# Patient Record
Sex: Male | Born: 1955 | Race: White | Hispanic: No | Marital: Married | Smoking: Former smoker
Health system: Southern US, Community
[De-identification: ages and names within clinical notes are randomized; demographics above are authoritative.]

## PROBLEM LIST (undated history)

## (undated) DIAGNOSIS — Z951 Presence of aortocoronary bypass graft: Secondary | ICD-10-CM

## (undated) DIAGNOSIS — I499 Cardiac arrhythmia, unspecified: Secondary | ICD-10-CM

## (undated) DIAGNOSIS — I10 Essential (primary) hypertension: Secondary | ICD-10-CM

## (undated) DIAGNOSIS — H9193 Unspecified hearing loss, bilateral: Secondary | ICD-10-CM

## (undated) DIAGNOSIS — T8859XA Other complications of anesthesia, initial encounter: Secondary | ICD-10-CM

## (undated) DIAGNOSIS — I219 Acute myocardial infarction, unspecified: Secondary | ICD-10-CM

## (undated) DIAGNOSIS — E785 Hyperlipidemia, unspecified: Secondary | ICD-10-CM

## (undated) DIAGNOSIS — I712 Thoracic aortic aneurysm, without rupture, unspecified: Secondary | ICD-10-CM

## (undated) DIAGNOSIS — I2510 Atherosclerotic heart disease of native coronary artery without angina pectoris: Secondary | ICD-10-CM

## (undated) DIAGNOSIS — Z8679 Personal history of other diseases of the circulatory system: Secondary | ICD-10-CM

## (undated) DIAGNOSIS — Z9889 Other specified postprocedural states: Secondary | ICD-10-CM

## (undated) HISTORY — DX: Hyperlipidemia, unspecified: E78.5

## (undated) HISTORY — PX: KNEE DEBRIDEMENT: SHX1894

## (undated) HISTORY — DX: Unspecified hearing loss, bilateral: H91.93

## (undated) HISTORY — DX: Atherosclerotic heart disease of native coronary artery without angina pectoris: I25.10

## (undated) HISTORY — DX: Thoracic aortic aneurysm, without rupture: I71.2

## (undated) HISTORY — DX: Thoracic aortic aneurysm, without rupture, unspecified: I71.20

## (undated) HISTORY — DX: Acute myocardial infarction, unspecified: I21.9

## (undated) HISTORY — DX: Morbid (severe) obesity due to excess calories: E66.01

## (undated) HISTORY — DX: Other specified postprocedural states: Z98.890

## (undated) HISTORY — PX: EXTERNAL EAR SURGERY: SHX627

## (undated) HISTORY — PX: HAND SURGERY: SHX662

## (undated) HISTORY — DX: Essential (primary) hypertension: I10

## (undated) HISTORY — PX: CORONARY ARTERY BYPASS GRAFT: SHX141

## (undated) HISTORY — DX: Personal history of other diseases of the circulatory system: Z86.79

## (undated) HISTORY — PX: PILONIDAL CYST EXCISION: SHX744

## (undated) HISTORY — DX: Presence of aortocoronary bypass graft: Z95.1

---

## 2000-06-22 ENCOUNTER — Encounter (INDEPENDENT_AMBULATORY_CARE_PROVIDER_SITE_OTHER): Payer: Self-pay | Admitting: *Deleted

## 2000-06-22 ENCOUNTER — Ambulatory Visit (HOSPITAL_BASED_OUTPATIENT_CLINIC_OR_DEPARTMENT_OTHER): Admission: RE | Admit: 2000-06-22 | Discharge: 2000-06-22 | Payer: Self-pay | Admitting: Orthopedic Surgery

## 2006-12-22 ENCOUNTER — Encounter: Admission: RE | Admit: 2006-12-22 | Discharge: 2006-12-22 | Payer: Self-pay | Admitting: Family Medicine

## 2007-03-13 ENCOUNTER — Encounter: Admission: RE | Admit: 2007-03-13 | Discharge: 2007-03-13 | Payer: Self-pay | Admitting: Sports Medicine

## 2007-04-04 ENCOUNTER — Emergency Department (HOSPITAL_COMMUNITY): Admission: EM | Admit: 2007-04-04 | Discharge: 2007-04-04 | Payer: Self-pay | Admitting: Emergency Medicine

## 2008-02-12 ENCOUNTER — Encounter: Admission: RE | Admit: 2008-02-12 | Discharge: 2008-02-12 | Payer: Self-pay | Admitting: Orthopedic Surgery

## 2008-02-23 ENCOUNTER — Encounter: Admission: RE | Admit: 2008-02-23 | Discharge: 2008-02-23 | Payer: Self-pay | Admitting: Family Medicine

## 2008-02-29 ENCOUNTER — Other Ambulatory Visit: Payer: Self-pay | Admitting: Orthopedic Surgery

## 2008-02-29 ENCOUNTER — Ambulatory Visit (HOSPITAL_BASED_OUTPATIENT_CLINIC_OR_DEPARTMENT_OTHER): Admission: RE | Admit: 2008-02-29 | Discharge: 2008-02-29 | Payer: Self-pay | Admitting: Orthopedic Surgery

## 2010-05-15 ENCOUNTER — Other Ambulatory Visit: Payer: Self-pay | Admitting: Otolaryngology

## 2010-05-15 DIAGNOSIS — H7010 Chronic mastoiditis, unspecified ear: Secondary | ICD-10-CM

## 2010-05-22 ENCOUNTER — Ambulatory Visit
Admission: RE | Admit: 2010-05-22 | Discharge: 2010-05-22 | Disposition: A | Discharge status: Home / Self Care | Payer: 59 | Source: Ambulatory Visit | Attending: Otolaryngology | Admitting: Otolaryngology

## 2010-05-22 DIAGNOSIS — H7010 Chronic mastoiditis, unspecified ear: Secondary | ICD-10-CM

## 2010-05-22 MED ORDER — IOHEXOL 300 MG/ML  SOLN
75.0000 mL | Freq: Once | INTRAMUSCULAR | Status: AC | PRN
  Administered 2010-05-22: 75 mL via INTRAVENOUS

## 2010-06-01 LAB — POCT I-STAT, CHEM 8
BUN: 17 mg/dL (ref 6–23)
Chloride: 107 mEq/L (ref 96–112)
Creatinine, Ser: 1.1 mg/dL (ref 0.4–1.5)
Glucose, Bld: 99 mg/dL (ref 70–99)
Sodium: 140 mEq/L (ref 135–145)

## 2010-06-10 ENCOUNTER — Other Ambulatory Visit: Payer: Self-pay | Admitting: Family Medicine

## 2010-06-10 DIAGNOSIS — I712 Thoracic aortic aneurysm, without rupture: Secondary | ICD-10-CM

## 2010-06-16 ENCOUNTER — Ambulatory Visit
Admission: RE | Admit: 2010-06-16 | Discharge: 2010-06-16 | Disposition: A | Discharge status: Home / Self Care | Payer: 59 | Source: Ambulatory Visit | Attending: Family Medicine | Admitting: Family Medicine

## 2010-06-16 DIAGNOSIS — I712 Thoracic aortic aneurysm, without rupture: Secondary | ICD-10-CM

## 2010-06-16 MED ORDER — IOHEXOL 300 MG/ML  SOLN
100.0000 mL | Freq: Once | INTRAMUSCULAR | Status: AC | PRN
  Administered 2010-06-16: 100 mL via INTRAVENOUS

## 2010-06-22 ENCOUNTER — Encounter (INDEPENDENT_AMBULATORY_CARE_PROVIDER_SITE_OTHER): Payer: 59 | Admitting: Thoracic Surgery (Cardiothoracic Vascular Surgery)

## 2010-06-22 DIAGNOSIS — I7101 Dissection of thoracic aorta: Secondary | ICD-10-CM
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
## 2010-06-30 NOTE — Op Note (Signed)
NAME:  Jacob Small, Jacob Small [name]:  [id number]   MEDICAL RECORD [name]:  [id number]          PATIENT TYPE:  AMB   LOCATION:  DSC                          FACILITY:  [facility]   PHYSICIAN:  Rodney A. Mortenson, M.D.DATE OF BIRTH:  February 10, 1956   DATE OF PROCEDURE:  02/29/2008  DATE OF DISCHARGE:                               OPERATIVE REPORT   JUSTIFICATION:  This is an 55 year old male referred through courtesy of  Dr. Blair Heys for a long history of right knee pain.  In September,  he did a lot of walk and was noted to have an increasing pain especially  along the medial joint line.  When he walks on treadmill, he has a  significant pain in the knee.  There is acute tenderness along the  medial joint line.  No fluid in the knee.  X-rays showed some narrowing  in the medial compartment.  Then, MRI shows an oblique tear of the post  horn of the medial meniscus, which extends into the meniscal body.  There was degenerative changes throughout the knee.  Because of  persistent pain and discomfort, he is now admitted for arthroscopic  evaluation and treatment.  Questions answered and encouraged  preoperatively.  Complications discussed extensively.   JUSTIFICATION FOR OUTPATIENT SURGERY:  Minimal morbidity.   PREOPERATIVE DIAGNOSES:  1. Tear, medial meniscus, right knee.  2. Early osteoarthritis, right knee.   POSTOPERATIVE DIAGNOSES:  1. Tear, medial meniscus, right knee.  2. Early osteoarthritis, right knee.   OPERATIONS:  1. Chondroplasty of the lateral femoral condyle, right knee.  2. Debridement of posterior half of the medial meniscus, right knee.   SURGEON:  Lenard Galloway. Chaney Malling, MD   ANESTHESIA:  MAC.   PATHOLOGY:  With the arthroscope in the knee, a very careful examination  was undertaken.  The patellofemoral joint appeared fairly normal.  As  the arthroscope was moved distally, there was some grade 2 and grade 3  changes over the anterior aspect of the  lateral femoral condyle but not  in the weightbearing area distally.  The lateral compartment was  visualized.  Articular cartilage, weightbearing area of the lateral  femoral condyle and lateral tibial plateau was absolutely normal, and  the entire circumference of the lateral meniscus was normal.  The  anterior cruciate ligament was visualized and this was normal.  In the  medial compartment, there was normal articular cartilage of the medial  femoral condyle.  There was a fissure and some early osteoarthritis  about the medial tibial plateau area.  There was an extensive tear of  the medial meniscus from the mid third to the posterior attachment.   PROCEDURE:  The patient placed on the operating table in a supine  position with pneumatic tourniquet about the right upper thigh.  The  right leg was placed in a leg holder.  The entire right lower extremity  prepped with DuraPrep and draped out in the usual manner.  An infusion  cannula was placed in superior medial pouch and knee distended with  saline.  Anteromedial and anterolateral portal was made, and  the  arthroscope was introduced.  The findings described as above.  Initially, attention turned to the anterior aspect of the lateral  femoral condyle and was debrided with chondroplastic shaver.  Once this  was debrided to my satisfaction to fairly stable cartilage, the  arthroscope was then passed into the medial compartment.  Through both  the medial and lateral portals, a series of baskets were inserted and  the posterior half of the medial meniscus was very aggressively  debrided.  This was followed up with the intra-articular shaver.  All  debris was removed.  The remaining rim was then aggressively contoured  and scalped with a chondroplastic shaver.  There remained a nice stable  rim of the posterior horn to the mid third of the medial meniscus, and  from the mid third anteriorly, there was normal meniscal tissue.  After   decompression, the knee was markedly improved.  Knee was then filled  with Marcaine.  Technically, I was extremely pleased with the surgical  outcome.  Large bulky pressure dressing was applied, and the patient  returned to the recovery room in excellent condition.   DRAINS:  None.   COMPLICATIONS:  None.   DISPOSITION:  1. Percocet for pain.  2. Usual postoperative instructions were given to the patient.  3. Return to my office on Wednesday next week.      Rodney A. Chaney Malling, M.D.  Electronically Signed     RAM/MEDQ  D:  02/29/2008  T:  03/01/2008  Job:  [id number]   cc:   Bryan Lemma. Manus Gunning, M.D.

## 2010-07-03 NOTE — Op Note (Signed)
[facility]  Patient:    Jacob Small, Jacob Small                       MRN: [id number] Proc. Date: 06/22/00 Adm. Date:  [id number] Attending:  Marlowe Shores                           Operative Report  PREOPERATIVE DIAGNOSIS:  Painful mass, palmar aspect, left hand.  POSTOPERATIVE DIAGNOSIS:  Painful mass, palmar aspect, left hand.  PROCEDURE:  Excisional biopsy, deep, left hand, palmar aspect.  SURGEON:  Artist Pais. Mina Marble, M.D.  ASSISTANT:  Junius Roads. Ireton, P.A.C.  ANESTHESIA:  Bier block.  TOURNIQUET TIME:  Thirty minutes.  COMPLICATIONS:  No complications.  DRAINS:  No drains.  DESCRIPTION OF PROCEDURE:  Patient was taken to the operating room and after induction of adequate Bier block analgesia, the left upper extremity was prepped and draped in the usual sterile fashion.  At this point in time, a 3-cm incision was made longitudinally, paralleling the index finger metacarpal area.  Incision was taken down through the skin and subcutaneous tissue with careful dissection through the palmar fascia and revealed a large mass which appeared to be an hemangioma.  This was carefully dissected down and removed for pathologic confirmation.  There were multiple branches that were tied off using 3-0 Vicryl ties.  The wound was then thoroughly irrigated.  Hemostasis was achieved with bipolar cautery and then wound was then closed with a running 3-0 Prolene subcuticular stitch.  Steri-Strips, 4 x 4s, fluffs and compressive dressing were applied as well as a volar splint.  Patient also had 0.25% plain Marcaine injected for postoperative pain control.  Patient tolerated procedure well and went to the recovery room in stable fashion. DD:  06/22/00 TD:  06/22/00 Job: [id number]

## 2010-08-11 ENCOUNTER — Encounter (HOSPITAL_COMMUNITY)
Admission: RE | Admit: 2010-08-11 | Discharge: 2010-08-11 | Disposition: A | Discharge status: Home / Self Care | Payer: 59 | Source: Ambulatory Visit | Attending: Otolaryngology | Admitting: Otolaryngology

## 2010-08-11 LAB — SURGICAL PCR SCREEN
MRSA, PCR: POSITIVE — AB
Staphylococcus aureus: POSITIVE — AB

## 2010-08-11 LAB — DIFFERENTIAL
Basophils Absolute: 0.1 10*3/uL (ref 0.0–0.1)
Basophils Relative: 1 % (ref 0–1)
Eosinophils Absolute: 0.1 10*3/uL (ref 0.0–0.7)
Eosinophils Relative: 1 % (ref 0–5)
Lymphocytes Relative: 27 % (ref 12–46)
Neutrophils Relative %: 64 % (ref 43–77)

## 2010-08-11 LAB — COMPREHENSIVE METABOLIC PANEL
ALT: 36 U/L (ref 0–53)
AST: 22 U/L (ref 0–37)
BUN: 15 mg/dL (ref 6–23)
CO2: 26 mEq/L (ref 19–32)
Calcium: 9.6 mg/dL (ref 8.4–10.5)
Creatinine, Ser: 0.91 mg/dL (ref 0.50–1.35)
GFR calc Af Amer: >60 mL/min (ref >60)

## 2010-08-11 LAB — CBC
HCT: 44 % (ref 39.0–52.0)
Hemoglobin: 15.5 g/dL (ref 13.0–17.0)
MCV: 83.2 fL (ref 78.0–100.0)
RBC: 5.29 MIL/uL (ref 4.22–5.81)

## 2010-08-14 ENCOUNTER — Other Ambulatory Visit: Payer: Self-pay | Admitting: Otolaryngology

## 2010-08-14 ENCOUNTER — Inpatient Hospital Stay (HOSPITAL_COMMUNITY)
Admission: RE | Admit: 2010-08-14 | Discharge: 2010-08-18 | DRG: 135 | Disposition: A | Discharge status: Home / Self Care | Payer: 59 | Source: Ambulatory Visit | Attending: Otolaryngology | Admitting: Otolaryngology

## 2010-08-14 DIAGNOSIS — I712 Thoracic aortic aneurysm, without rupture, unspecified: Secondary | ICD-10-CM | POA: Diagnosis present

## 2010-08-14 DIAGNOSIS — I519 Heart disease, unspecified: Secondary | ICD-10-CM | POA: Diagnosis not present

## 2010-08-14 DIAGNOSIS — Z6841 Body Mass Index (BMI) 40.0 and over, adult: Secondary | ICD-10-CM

## 2010-08-14 DIAGNOSIS — I2119 ST elevation (STEMI) myocardial infarction involving other coronary artery of inferior wall: Secondary | ICD-10-CM | POA: Diagnosis not present

## 2010-08-14 DIAGNOSIS — H7120 Cholesteatoma of mastoid, unspecified ear: Secondary | ICD-10-CM | POA: Diagnosis present

## 2010-08-14 DIAGNOSIS — Z79899 Other long term (current) drug therapy: Secondary | ICD-10-CM

## 2010-08-14 DIAGNOSIS — H7010 Chronic mastoiditis, unspecified ear: Principal | ICD-10-CM | POA: Diagnosis present

## 2010-08-14 DIAGNOSIS — Z01812 Encounter for preprocedural laboratory examination: Secondary | ICD-10-CM

## 2010-08-14 DIAGNOSIS — H903 Sensorineural hearing loss, bilateral: Secondary | ICD-10-CM | POA: Diagnosis present

## 2010-08-14 DIAGNOSIS — Y836 Removal of other organ (partial) (total) as the cause of abnormal reaction of the patient, or of later complication, without mention of misadventure at the time of the procedure: Secondary | ICD-10-CM | POA: Diagnosis not present

## 2010-08-14 DIAGNOSIS — E781 Pure hyperglyceridemia: Secondary | ICD-10-CM | POA: Diagnosis present

## 2010-08-14 DIAGNOSIS — I10 Essential (primary) hypertension: Secondary | ICD-10-CM | POA: Diagnosis present

## 2010-08-14 DIAGNOSIS — E669 Obesity, unspecified: Secondary | ICD-10-CM | POA: Diagnosis present

## 2010-08-14 LAB — GLUCOSE, CAPILLARY: Glucose-Capillary: 223 mg/dL — ABNORMAL HIGH (ref 70–99)

## 2010-08-15 LAB — BASIC METABOLIC PANEL
Calcium: 9.3 mg/dL (ref 8.4–10.5)
Chloride: 101 mEq/L (ref 96–112)
Creatinine, Ser: 0.84 mg/dL (ref 0.50–1.35)
GFR calc Af Amer: >60 mL/min (ref >60)
Glucose, Bld: 154 mg/dL — ABNORMAL HIGH (ref 70–99)
Sodium: 137 mEq/L (ref 135–145)

## 2010-08-15 LAB — CARDIAC PANEL(CRET KIN+CKTOT+MB+TROPI)
CK, MB: 31.1 ng/mL (ref 0.3–4.0)
Relative Index: 10.2 — ABNORMAL HIGH (ref 0.0–2.5)
Total CK: 306 U/L — ABNORMAL HIGH (ref 7–232)

## 2010-08-15 LAB — CBC
HCT: 39 % (ref 39.0–52.0)
Platelets: 189 10*3/uL (ref 150–400)
RDW: 12.9 % (ref 11.5–15.5)

## 2010-08-16 LAB — CARDIAC PANEL(CRET KIN+CKTOT+MB+TROPI)
Relative Index: 9.2 — ABNORMAL HIGH (ref 0.0–2.5)
Total CK: 137 U/L (ref 7–232)

## 2010-08-17 DIAGNOSIS — I2510 Atherosclerotic heart disease of native coronary artery without angina pectoris: Secondary | ICD-10-CM

## 2010-08-17 DIAGNOSIS — Z01810 Encounter for preprocedural cardiovascular examination: Secondary | ICD-10-CM

## 2010-08-17 LAB — BASIC METABOLIC PANEL
BUN: 14 mg/dL (ref 6–23)
Calcium: 9.3 mg/dL (ref 8.4–10.5)
Creatinine, Ser: 0.84 mg/dL (ref 0.50–1.35)
GFR calc non Af Amer: >60 mL/min (ref >60)
Glucose, Bld: 93 mg/dL (ref 70–99)

## 2010-08-17 LAB — PROTIME-INR: Prothrombin Time: 14 seconds (ref 11.6–15.2)

## 2010-08-17 LAB — CBC
MCH: 28.8 pg (ref 26.0–34.0)
MCHC: 34.3 g/dL (ref 30.0–36.0)
Platelets: 166 10*3/uL (ref 150–400)
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
## 2010-08-18 ENCOUNTER — Encounter: Payer: Self-pay | Admitting: *Deleted
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
## 2010-08-20 NOTE — Op Note (Signed)
NAME:  Jacob Small, Jacob Small [name]:  [id number]  MEDICAL RECORD [name]:  [id number]  LOCATION:  2908                         FACILITY:  [facility]  PHYSICIAN:  Carolan Shiver, M.D.    DATE OF BIRTH:  10/23/1955  DATE OF PROCEDURE:  08/14/2010 DATE OF DISCHARGE:                              OPERATIVE REPORT   JUSTIFICATION FOR PROCEDURE:  Beulah Gandy. Steib is a 55 year old white male who is here today for a revision right canal-up tympanomastoidectomy.  The patient has had a long history of chronic ear disease.  He underwent a previous right tympanomastoidectomy by Dr. Hermelinda Medicus in the 1980s.  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  It was draining purulent fluid from the inferior aspect of his right postauricular incision.  He was treated with oral antibiotics and the fistula did close spontaneously.  A CT scan of his temporal bones was performed and the patient was found to have some chronic residual mastoiditis with most of his digastric tip still present.  It was felt that he had smoldering chronic right mastoiditis due to an incomplete regional mastoidectomy and this is what led him to the mastoid cutaneous fistula.  He was recommended for a revision of right canal-up tympanomastoidectomy.  He was also noted to have some cholesteatoma of the anterior aspect of his right tympanic membrane and was informed that this would be excised and his tympanic membrane with be repaired.  Risks, complications, and alternatives of the procedures were explained to him.  Questions were invited and answered.  An informed consent was signed and witnessed.  Preop audiometric testing on August 11, 2010 documented bilateral moderate severe sensorineural hearing losses with an SRT of 65 dB AD with 68% discrimination and an SRT of 60 dB AS with 60% discrimination.  JUSTIFICATION FOR INPATIENT SETTING: 1. The patient's age need for general endotracheal  anesthesia. 2. The patient has a history of coronary artery disease and requires     postoperative cardiac monitoring and observation in ICU step-down     unit.  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 Hermelinda Medicus in the 1980s.  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 Hermelinda Medicus in the 1980s.  OPERATION: 1. Revision of right canal-up tympanomastoidectomy with excision of     right tympanic membrane cholesteatoma and type 1 medial fascia     graft tympanoplasty. 2. Microdissection using the operating room microscope.  SURGEON:  Carolan Shiver, MD  ANESTHESIA:  General endotracheal, Zenon Mayo, MD and CRNA, [facility].  COMPLICATIONS:  None.  SUMMARY REPORT:  After the patient was taken to the operating room, he was placed in the supine position.  An IV had been begun in the holding area.  General IV induction was then performed under the guidance of Dr. Sampson Goon.  The patient was then orally intubated by [facility] without complications.  He was properly positioned and monitored. Elbows and ankles were padded with foam rubber and a Foley catheter was inserted.  The patient is a very  large man and his arms were supported on arm boards as he was wider than the operating room table.  Again, elbows and ankles were padded with foam rubber and I initiated a time- out.  Small amount of hair was then clipped in the right postauricular area. His hair was taped and stocking cap was applied.  A right postauricular incision was marked to include excision of the previous scar.  The area was then infiltrated with 8 mL of 1% Xylocaine with 1:100,000 epinephrine.  Several wire needle electrodes were then inserted into the right orbicularis oris and oculi muscles and  suprasternal notch connected to a facial nerve monitor preamplifier.  The patient's right ear and hemiface were then prepped with Betadine and draped in standard fashion for a revision of tympanomastoidectomy.  Examination of the right ear canal with using the operating room microscope revealed a 5.5-mm diameter ear canal.  There was a cholesteatoma present in the anterior portion of the tympanic membrane extending from 2 o'clock to 5 o'clock with a dimpled center.  The cholesteatoma could be seen spreading along the mural surface of the tympanic membrane and extended from approximately 1 o'clock to almost 6 o'clock.  Ear canal was cleaned and debrided, four-quadrant ear canal blocks were performed with 1.5 mL of 2% Xylocaine with 1:50,000 epinephrine.  The ear canal was irrigated with saline.  A right postauricular incision was then made and the previous postauricular scar was excised.  Anterior and posterior flaps were elevated.  Soft tissue in the area of the mastoid cutaneous fistula was removed.  T-shaped incision was then made in the musculoperiosteal layer.  The anterior and posterior subperiosteal flaps were elevated. The mastoid cavity itself was partially filled with chronically infected tissue.  The tissue was left in the mastoid cavity as the flaps were elevated.  Tissue was then excised and sent as a specimen.  Examination of the mastoid cavity revealed that the entire digastric tip cells had not been removed nor had the cells been removed around the sigmoid sinus both anteriorly and posteriorly.  Using a #7 cutting bur on a [employer]ryker saber drill with continuous suction irrigation, complete mastoidectomy was performed.  Cells were removed from the tegmen mastoid DM along the sigmoid sinus both anteriorly and posteriorly and that digastric tip cells were completely removed down until digastric periosteum was identified.  The dissection was then carried into the antrum and  attic.  Bone lateral to the ossicles was removed with a #3 cutting and diamond burs and a #2 diamond bur and the ossicles were inspected.  There was a soft tissue around them, but no cholesteatoma.  The ossicles were intact and mobile.  The dome of lateral semicircular canal was also normal.  Cells entrapments triangle were removed.  The entire mastoid cavity was then polished with diamond burs using continuous suction irrigation.  Posterior canal wall skin was then dissected medially with a Therapist, nutritional and an incision was made in the posterior canal wall skin from 6 to 12 o'clock just medial to the [facility] junction.  Self-retaining Bellucci retractor was inserted, but the retractor was not large enough because of the patient's size.  Therefore, a second retractor was inserted and neurosurgical fishhooks were also used to pull the soft tissue anteriorly in order to gain exposure.  The patient had a narrow ear canal and very difficult exposure of his tympanic membrane in middle ear.  Once the exposure had been obtained, the partial myringectomy was performed with a  59-10 beaver blade to excise the anterior tympanic membrane cholesteatoma.  This left the patient with a 50% perforation. The edges of the perforation were checked with a 45 degrees McCabe perforation rasp for any residual mural cholesteatoma.  Posterior one- half the patient's tympanic membrane was completely collapsed, draped over the incus and onto the promontory.  A vertical incision was then made and 5 o'clock with a McCabe knife and sickle knife.  An atticotomy flap was then elevated.  It was necessary to perform a canal plasty in order to open the canal wide enough just to raise the atticotomy flap.  The middle ear was then entered posterosuperiorly.  The chorda tympani nerve was stuck down to the collapse tympanic membrane.  I was able to remove the tympanic membrane from the collapsed chorda tympani nerve, but  eventually sacrificed the chorda tympani in order to simply gain exposure to the ear.  More posterior canal wall bone was removed with a #2 diamond bur.  The ossicles were then checked and were found to be intact and mobile.  A temporalis fascia graft was then harvested from the right supra- auricular area, pressed between tongue blades and dried.  The graft was then trimmed and then placed medial to the tympanic membrane remnant and to the malleus handle.  The graft was then brought out through the perforation and folded back 180 degrees posteriorly.  The protympanum was then packed with Gelfoam disk soaked in Cipro HC.  The graft was then rotated 180 degrees and tucked for 360 degrees around the circumference of the perforation.  The mesotympanum was then packed Gelfoam disk soaked in the same antibiotic solution.  The graft was then draped up the posterior bony canal wall to cover exposed bone and the atticotomy flap was draped over the fascia graft.  Medial canal was then packed with Gelfoam disks soaked in Cipro HC and the lateral canal was packed with a quarter inch iodoform Nu gauze impregnated with bacitracin ointment.  A sliver Penrose drain was placed in the mastoid cavity.  Bleeding was controlled with electrocautery and the incision was then closed in 3 layers using interrupted 3-0 Vicryls for the muscular periosteal layer and inverted interrupted 3-0 Vicryls for the subcutaneous layer.  Prior to closing the subcutaneous layer, the site was copiously irrigated with bacitracin containing saline.  The skin was closed with running locking 5-0 Ethilon.  Bacitracin ointment was applied.  The ear was padded with Telfa and cotton and the standard adult Glasscock mastoid dressing was applied loosely in the standard fashion.  Foley catheter was removed. The patient was awakened, extubated and transferred to his hospital bed. He appeared to tolerate with the general endotracheal  anesthesia and the procedure well, left the operating room in stable condition.  TOTAL FLUIDS:  1700 mL.  TOTAL BLOOD LOSS:  Less than 30 mL.  TOTAL URINE OUTPUT:  400 mL.  Sponge, needle and cotton ball counts were correct at the termination of the procedure.  Two specimens were sent to pathology: 1. Right mastoid contents. 2. Right tympanic membrane cholesteatoma.  Microdissection using the operating room microscope was utilized during this procedure.  The patient received Ancef 1 gram IV, Zofran 4 mg IV at the beginning of the procedure and Decadron 10 mg IV.  Mr. Mcnelly will be admitted to the PACU, then to a step-down bed with cardiac monitoring for overnight observation.  If stable overnight, he will be discharged on August 15, 2010, with his wife who  will be instructed to return him to my office on August 24, 2010, at 1:50 p.m.  DISCHARGE MEDICATIONS: 1. Cefzil 500 mg p.o. b.i.d. x10 days with food. 2. Vicodin #30 one-two p.o. q.4 h p.r.n. pain. 3. Cipro HC drops, 2 drops AD t.i.d. times 1 week.  He is to keep his head elevated on 3 pillows for the next 5 evenings, avoid water exposure AD and follow a regular diet.  He is to call to [phone] for any postoperative problems directly related to the procedure.  He will be given both verbal and written instructions and informed that the instructions are available on our web site at [email or web address]  SUMMARY:  The patient was found to have residual disease in his right mastoid tip.  The right mastoid tip had not been removed during the previous mastoidectomy performed by Dr. Haroldine Laws in the 1980s.  The patient also had a large right tympanic membrane cholesteatoma.  The anterior one-half of his tympanic membrane was excised along with the cholesteatoma.  A complete mastoidectomy was performed removing all of the tip cells and cells around the right sigmoid sinus.  The ossicles found to be intact and mobile.  Chorda  tympani nerve was sacrificed as the patient had adhesive otitis media with complete collapse of his tympanic membrane posteriorly to the promontory and shrink wrapped around the long process of the incus.  There was no incus erosion.  No prostheses, tubes, or cement were placed in the ear.  There were no complications.  Microdissection using the operating room microscope was utilized and was necessary to perform the procedure.  The type 1 medial fascia graft tympanoplasty was used to repair the tympanic membrane perforation secondary to the partial myringectomy.  The patient did not have any intraoperative complications.  Sponge, needle, and cotton ball counts were correct at the termination of the procedure.     Carolan Shiver, M.D.     EMK/MEDQ  D:  08/14/2010  T:  08/15/2010  Job:  [id number]  cc:   Bryan Lemma. Manus Gunning, M.D. [name], M.D. Lyn Records, M.D.  Electronically Signed by Ermalinda Barrios M.D. on 08/20/2010 12:25:06 PM
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
## 2010-08-20 NOTE — Discharge Summary (Signed)
NAME:  Jacob Small, Jacob Small [name]:  [id number]  MEDICAL RECORD [name]:  [id number]  LOCATION:  2025                         FACILITY:  [facility]  PHYSICIAN:  Lyn Records, M.D.   DATE OF BIRTH:  1955/12/16  DATE OF ADMISSION:  08/14/2010 DATE OF DISCHARGE:  08/18/2010                              DISCHARGE SUMMARY   DISCHARGE DIAGNOSES: 1. Elective right tympanomastoidectomy on August 14, 2010, by Dr. Ermalinda Barrios.     a.     History of chronic recurrent infection. 2. Acute inferior ST-elevation myocardial infarction several hours     after surgery on August 14, 2010, treated with medical therapy. 3. Known history of coronary artery disease with prior inferior     myocardial infarction treated with angioplasty in 1997. 4. Aortic aneurysm, ascending with aortic root diameter of 5.4 cm. 5. Hypertension, poorly controlled as an outpatient.     a.     Significant improvement and control at the time of      discharge. 6. Hyperlipidemia.  RECOMMENDATIONS: 1. The patient will see Dr. Tressie Stalker on August 22, 2010, for     consideration of aortic root replacement and two-vessel coronary     artery bypass surgery. 2. Medications at discharge include:     a.     Micardis 40 mg daily.     b.     Amlodipine 5 mg daily.     c.     Aspirin 325 mg per day.     d.     Carvedilol 12.5 mg twice daily.     e.     Rosuvastatin 10 mg daily.     f.     Isosorbide mononitrate XR 60 mg daily.     g.     Nitroglycerin 0.4 mg sublingually p.r.n. if recurring chest      or arm discomfort. 3. Activity:  Limited to activities that are aerobic without planned     exercise or isometric activity.  He is to remain out of work. 4. Diet:  Low salt, fat modified, heart healthy. 5. The patient is to call if any recurrent chest or arm discomfort.     He is instructed on the use of sublingual nitroglycerin.  CONDITION ON DISCHARGE:  Improved.  HISTORY, PHYSICAL, AND HOSPITAL COURSE:  The patient was  brought into the hospital electively by Dr. Dorma Russell and underwent successful right tympanomastoidectomy on August 14, 2010.  Several hours after the operation, the patient became nauseated and began having severe left shoulder discomfort.  An EKG demonstrated ST elevation in the inferior leads.  He was taken to the catheterization laboratory by Dr. Lance Muss where he was found to have a totally occluded right coronary. The right coronary could not be selectively engaged.  He was noted to have significant briskly filly collaterals to the inferior wall/PDA.  He was also noted at the time of cath to have severe complex proximal-to- mid LAD disease.  Many of the collaterals to the PDA were from a large septal perforator.  At the time of cath, the patient had resolution of chest discomfort and ST-segment elevation.  No  further intervention was performed.  He remained in the coronary care unit for 48 hours.  He was able to ambulate with the assistance of Cardiac Rehab.  There were no recurrent symptoms.  The peak CPK-MB was 306/31.1 and the peak troponin I was 4.26.  Creatinine at discharge is 0.84.  Because of the patient's complex cardiovascular anatomy including an aortic aneurysm making catheterization somewhat difficult, totally occluded right coronary collateralized from the left coronary, and complex disease in the LAD, it was felt that a combined procedure including aortic root replacement and simultaneous coronary artery bypass grafting was the patient's best treatment option.  He was seen in consultation by Dr. Evelene Croon who agreed with this approach.  After some discussion among physicians and the patient and family, we have decided to discharge the patient home.  He has remained relatively inactive.  He will expeditiously see Dr. Cornelius Moras on Monday, August 22, 2010. He will return if any recurrent chest discomfort.  We hope that he can undergo this surgical procedure within the  next 7-10 days.  At the time of discharge, the patient is improved and has had no recurring episodes of chest discomfort since August 14, 2010.     Lyn Records, M.D.     HWS/MEDQ  D:  08/18/2010  T:  08/18/2010  Job:  [id number]  cc:   Carolan Shiver, M.D. [name], M.D. Bryan Lemma. Manus Gunning, M.D.  Electronically Signed by Verdis Prime M.D. on 08/20/2010 01:38:54 PM

## 2010-08-24 ENCOUNTER — Ambulatory Visit (INDEPENDENT_AMBULATORY_CARE_PROVIDER_SITE_OTHER): Payer: 59 | Admitting: Thoracic Surgery (Cardiothoracic Vascular Surgery)

## 2010-08-24 DIAGNOSIS — I712 Thoracic aortic aneurysm, without rupture, unspecified: Secondary | ICD-10-CM

## 2010-08-24 DIAGNOSIS — I2510 Atherosclerotic heart disease of native coronary artery without angina pectoris: Secondary | ICD-10-CM

## 2010-08-25 NOTE — Assessment & Plan Note (Signed)
OFFICE VISIT  Jacob, Small DOB:  [birth year]                                        August 24, 2010 CHART #:  [id number]  HISTORY OF PRESENT ILLNESS:  The patient returns for followup of recently discovered two-vessel coronary artery disease and associated with his known aneurysm of the aortic root in the ascending thoracic aorta.  I originally had the privilege of seeing him in consultation in May of this year when he presented for evaluation of his thoracic aortic aneurysm.  At that time, his aneurysm was measured between 5.0 and 5.3 cm in his greatest transverse diameter, and medical therapy was recommended with plans for annual surveillance followup with repeat CT angiograms.  Subsequently, the patient underwent preoperative cardiac evaluation by Dr. Verdis Prime with an exercise stress test that demonstrated a fairly large partially reversible inferior wall ischemic defect.  Because the patient was completely asymptomatic and otherwise clinically doing well, the patient was cleared for elective ear surgery by Dr. Dorma Russell.  This was performed on August 14, 2010.  The patient subsequently developed an acute non-ST-segment elevation myocardial infarction the evening following surgery.  Of note, his ear surgery was fairly extensive and required several hours of general anesthesia.  The patient recovered uneventfully after this complication.  Catheterization demonstrated severe two-vessel coronary artery disease with preserved left ventricular function.  The patient was seen in consultation by Dr. Laneta Simmers while he was in the hospital, and followup appointment was arranged for me to see him here in the office today.  The patient has also been seen in followup earlier today by Dr. Dorma Russell for suture removal.  He remains on antibiotic eardrops and nasal swab with antibiotic ointment because of his chronic MRSA infection.  Oral antibiotics have been discontinued.  The  patient returns for followup today.  He reports that he has not had any further episodes of pain in his chest or left shoulder since he underwent catheterization while he was in the hospital.  He has not had any shortness of breath.  He otherwise feels quite well.  The swelling around his right ear has apparently improved, but is still not completely resolved.  He has no other complaints.  The remainder of his review of systems is unremarkable.  CURRENT MEDICATIONS: 1. Aspirin 325 mg daily. 2. Amlodipine 5 mg daily. 3. Micardis 40 mg daily. 4. Carvedilol 12.5 mg twice daily. 5. Isosorbide mononitrate 60 mg daily. 6. Bactroban nasal ointment 2% twice daily. 7. Crestor 10 mg daily. 8. Nitroglycerin sublingual as needed (none used). 9. Antibiotic eardrops (Brandon dose unknown presently).  PHYSICAL EXAMINATION:  General:  The patient is a well-appearing male who appears his stated age, in no acute distress.  Vital Signs:  Blood pressure 140/84 and pulse 67 and regular.  Chest:  Examination of the chest reveals clear breath sounds that are symmetrical bilaterally. Cardiovascular:  Notable for regular rate and rhythm.  No murmurs, rubs or gallops are noted. Abdomen:  Soft and nontender.  Extremities:  Warm and well-perfused. There is no lower extremity edema.  DIAGNOSTIC TESTS:  Cardiac catheterization performed by Dr. Eldridge Dace, August 14, 2010, is reviewed.  Findings are as noted previously in the fairly thorough consultation report dictated by Dr. Laneta Simmers on August 17, 2010.  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  The patient has remained clinically stable since hospital discharge.  I agree that he would best be treated with  elective coronary artery bypass grafting.  His aneurysm is certainly large enough that it should be treated surgically when he undergoes surgical revascularization for treatment of his coronary artery disease.  Hopefully, this could be performed with preservation of the native aortic valve.  Transesophageal echocardiogram would be helpful to further evaluate the anatomy of the native aortic valve, but I would be reluctant to put the patient through transesophageal echocardiogram at this point prior to surgery because of his fairly tight coronary artery anatomy and a potential for another non-ST-segment elevation myocardial infarction.  It was not clear to me his status with respect to his methicillin resistant Staphylococcus aureus infection as it will certainly be important to make sure that timing is as optimal as possible to decrease risk of exacerbation of further problems with his ear, or further complicate fairly large surgical procedure that includes placement of prosthetic material.  PLAN:  We will contact Dr. Dorma Russell directly to discuss matters with regards to the ear and the chronic infection.  The patient will return in one week's time, so that we can make more definitive plans with respect to the timing of surgery.  He will certainly call should he develop any problems with recurrent chest pain suspicious for unstable angina. All of his questions have been addressed.  [name], M.D. Electronically Signed  CHO/MEDQ  D:  08/24/2010  T:  08/25/2010  Job:  [id number]  cc:   Jacob Small, M.D. Jacob Small, M.D. Jacob Small, M.D.

## 2010-08-31 ENCOUNTER — Encounter (INDEPENDENT_AMBULATORY_CARE_PROVIDER_SITE_OTHER): Payer: 59 | Admitting: Thoracic Surgery (Cardiothoracic Vascular Surgery)

## 2010-08-31 DIAGNOSIS — I712 Thoracic aortic aneurysm, without rupture: Secondary | ICD-10-CM

## 2010-08-31 DIAGNOSIS — I2510 Atherosclerotic heart disease of native coronary artery without angina pectoris: Secondary | ICD-10-CM

## 2010-08-31 NOTE — Assessment & Plan Note (Signed)
OFFICE VISIT  Jacob Small, Jacob Small DOB:  1955-09-07                                        August 31, 2010 CHART #:  [id number]  The patient returns for followup of coronary artery disease and ascending thoracic aortic aneurysm.  He was last seen here in the office 1 week ago on July 9.  Since then, he has been seen again in followup by Dr. Ermalinda Barrios who has recultured his naris for the presence of methicillin-resistant Staphylococcus aureus.  Dr. Dorma Russell and I discussed matters at length over the telephone, and he feels that the swelling will continue to gradually get better over the coming weeks, but the patient could probably go ahead and proceed with elective cardiac surgery at any point in time.  I discussed matters at length with the patient again here in the office today.  For a variety of scheduling reasons, we will plan to proceed with surgery on Tuesday, August 7.  He understands the indications, risks, and potential benefits of surgery. He understands that we will plan to replace his aortic root and attempt to preserve his native aortic valve if at all possible.  If his valve cannot be repaired and preserved than he specifically desires that we would replace his valve using some sort of a bioprosthetic tissue valve in an effort to avoid the need for long-term anticoagulation and Coumadin.  He understands that either with the repaired native valve or a bioprosthetic tissue valve, there is always a possibility for late structural valve deterioration and failure requiring further intervention in the future.  All of his questions have been addressed. He understands and accepts all associated risks of surgery and desires to proceed as described.  Between now and then, he will continue to be very careful to avoid any sort of strenuous physical activity.  If he develops any sort of chest discomfort or shortness of breath, he will call or present directly to the  emergency room.  All of his questions have been answered.  [name], M.D. Electronically Signed  CHO/MEDQ  D:  08/31/2010  T:  08/31/2010  Job:  [id number]  cc:   Lyn Records, M.D. Carolan Shiver, M.D. Bryan Lemma. Manus Gunning, M.D.

## 2010-09-16 NOTE — Cardiovascular Report (Signed)
NAME:  Jacob Small, Jacob Small [name]:  [id number]  MEDICAL RECORD [name]:  [id number]  LOCATION:  2908                         FACILITY:  [facility]  PHYSICIAN:  Corky Crafts, MDDATE OF BIRTH:  1955/10/29  DATE OF PROCEDURE:  08/14/2010 DATE OF DISCHARGE:                           CARDIAC CATHETERIZATION   PRIMARY CARDIOLOGIST:  Lyn Records, MD  PROCEDURES PERFORMED:  Left heart catheterization, ascending aortogram, and coronary angiogram.  OPERATOR:  Corky Crafts, MD  INDICATIONS:  Inferior ST-elevation MI.  PROCEDURE NARRATIVE:  The patient was brought emergently to the Cath Lab.  Right femoral arterial access was obtained by Dr. Antoine Poche using modified Seldinger technique.  I attempted to access the right coronary artery with a JR-4 catheter due to his large aortic root.  This catheter did not reach.  We subsequently switched out to a JL-5 catheter to try and engage the left main which was unsuccessful.  This catheter was also too short to reach the left main.  We switched out for a JL-6 guide and were able to engage just barely the left main and with contrast dye more selectively going into the LAD.  Digital angiography was performed in multiple projections using hand injection of contrast.  We then tried several different catheters including a JR-5 guide, an AL-2 guide, and an AL-3 guide trying to engage the right.  Subsequently, a pigtail catheter was advanced to the ascending aorta and a power injection of contrast was performed to image the aortic root.  During the left main angiography, we did get a catheter into the LV and hemodynamics were recorded as well as a pullback.  Angiomax was given for what thought would be an intervention.  We also switched to a long sheath in the groin to help straighten out the iliac tortuosity.  Due the patient's height, catheters were not reaching the vessel ostia, so we tried to straighten out the iliacs with a  long sheath.  FINDINGS:  There is a significantly dilated aortic root.  I ultimately used a JL-6 to engage the left main. Left main is widely patent. Left circumflex had mild to moderate irregularities proximally.  There is an OM-1 which is large and has mild irregularities. The left anterior descending had mild to moderate proximal disease.  In the mid LAD, there is sequential 80 and 95% lesions.  There is a large septal which fed collaterals to the distal right territory which originates before both of these stenoses.  There is mild atherosclerosis in the mid to distal LAD.  The distal RCA territory does get brisk collaterals from the left system. We were unable to engage the RCA as noted above. The aortic root shot shows no evidence of the right coronary artery in the standard position.  The root appears dilated.  HEMODYNAMIC RESULTS:  Left ventricular pressure 126/6 with an LVEDP of 15 mmHg.  Aortic pressure 131/84 with a mean aortic pressure of 110 mmHg.  IMPRESSION: 1. Acute inferior myocardial infarction, unable to engage right     coronary artery, fairly brisk left-to-right collaterals noted. 2. Severe left anterior descending disease. 3. Severely dilated aortic root. 4. Normal left ventricular end-diastolic pressure.  RECOMMENDATIONS:  We will continue merit medical therapy in the ICU over the next few days as we were unable to treat what was likely the culprit vessel which was the RCA.  He had an angioplasty in 1996 done to the RCA.  It certainly is possible that this is a chronic total occlusion and his collaterals were compromised causing the acute ischemia. Consider intervention to the LAD after this acute event.  The other issue is a dilated aortic root.  He has been evaluated by Dr. Cornelius Moras for possible aortic root replacement, part of the rationale behind not fixing his LAD was that if he does need an aortic replacement he may be able to get a LIMA to LAD at that  same time.  Also, he was hemodynamically stable and therefore if we had compromised LAD territory with the complications we could have made the situation much worse.  We will discuss the case with a surgeon.  Given that currently he is pain free and stable, he will be watched in the CCU.  Continue aspirin.  We will hold off on Plavix since he may need surgery.     Corky Crafts, MD     JSV/MEDQ  D:  08/14/2010  T:  08/15/2010  Job:  [id number]  Electronically Signed by Lance Muss MD on 09/16/2010 01:14:48 PM

## 2010-09-18 ENCOUNTER — Encounter (HOSPITAL_COMMUNITY)
Admission: RE | Admit: 2010-09-18 | Discharge: 2010-09-18 | Disposition: A | Discharge status: Home / Self Care | Payer: 59 | Source: Ambulatory Visit | Attending: Thoracic Surgery (Cardiothoracic Vascular Surgery) | Admitting: Thoracic Surgery (Cardiothoracic Vascular Surgery)

## 2010-09-18 ENCOUNTER — Other Ambulatory Visit: Payer: Self-pay | Admitting: Thoracic Surgery (Cardiothoracic Vascular Surgery)

## 2010-09-18 ENCOUNTER — Ambulatory Visit (HOSPITAL_COMMUNITY)
Admission: RE | Admit: 2010-09-18 | Discharge: 2010-09-18 | Disposition: A | Discharge status: Home / Self Care | Payer: 59 | Source: Ambulatory Visit | Attending: Thoracic Surgery (Cardiothoracic Vascular Surgery) | Admitting: Thoracic Surgery (Cardiothoracic Vascular Surgery)

## 2010-09-18 DIAGNOSIS — Z01810 Encounter for preprocedural cardiovascular examination: Secondary | ICD-10-CM | POA: Insufficient documentation

## 2010-09-18 DIAGNOSIS — I77810 Thoracic aortic ectasia: Secondary | ICD-10-CM | POA: Insufficient documentation

## 2010-09-18 DIAGNOSIS — I2510 Atherosclerotic heart disease of native coronary artery without angina pectoris: Secondary | ICD-10-CM | POA: Insufficient documentation

## 2010-09-18 LAB — BLOOD GAS, ARTERIAL
Bicarbonate: 26.5 mEq/L — ABNORMAL HIGH (ref 20.0–24.0)
Drawn by: 181601
O2 Saturation: 98.3 %
Patient temperature: 98.6

## 2010-09-18 LAB — CBC
MCH: 29 pg (ref 26.0–34.0)
MCV: 82.7 fL (ref 78.0–100.0)
Platelets: 148 10*3/uL — ABNORMAL LOW (ref 150–400)
RDW: 13.4 % (ref 11.5–15.5)

## 2010-09-18 LAB — COMPREHENSIVE METABOLIC PANEL
ALT: 15 U/L (ref 0–53)
AST: 14 U/L (ref 0–37)
Albumin: 3.8 g/dL (ref 3.5–5.2)
Alkaline Phosphatase: 78 U/L (ref 39–117)
BUN: 17 mg/dL (ref 6–23)
Chloride: 103 mEq/L (ref 96–112)
Potassium: 4.1 mEq/L (ref 3.5–5.1)
Sodium: 138 mEq/L (ref 135–145)
Total Bilirubin: 1.2 mg/dL (ref 0.3–1.2)

## 2010-09-18 LAB — URINALYSIS, ROUTINE W REFLEX MICROSCOPIC
Bilirubin Urine: NEGATIVE
Leukocytes, UA: NEGATIVE
Nitrite: NEGATIVE
Specific Gravity, Urine: 1.023 (ref 1.005–1.030)
pH: 7 (ref 5.0–8.0)

## 2010-09-18 LAB — SURGICAL PCR SCREEN: Staphylococcus aureus: NEGATIVE

## 2010-09-18 LAB — HEMOGLOBIN A1C
Hgb A1c MFr Bld: 5.3 % (ref <5.7)
Mean Plasma Glucose: 105 mg/dL (ref <117)

## 2010-09-18 LAB — APTT: aPTT: 29 seconds (ref 24–37)

## 2010-09-18 LAB — ABO/RH: ABO/RH(D): A POS

## 2010-09-22 ENCOUNTER — Other Ambulatory Visit: Payer: Self-pay | Admitting: Thoracic Surgery (Cardiothoracic Vascular Surgery)

## 2010-09-22 ENCOUNTER — Inpatient Hospital Stay (HOSPITAL_COMMUNITY): Payer: 59

## 2010-09-22 ENCOUNTER — Inpatient Hospital Stay (HOSPITAL_COMMUNITY)
Admission: RE | Admit: 2010-09-22 | Discharge: 2010-09-26 | DRG: 220 | Disposition: A | Discharge status: Home / Self Care | Payer: 59 | Source: Ambulatory Visit | Attending: Thoracic Surgery (Cardiothoracic Vascular Surgery) | Admitting: Thoracic Surgery (Cardiothoracic Vascular Surgery)

## 2010-09-22 DIAGNOSIS — I712 Thoracic aortic aneurysm, without rupture, unspecified: Secondary | ICD-10-CM

## 2010-09-22 DIAGNOSIS — Z951 Presence of aortocoronary bypass graft: Secondary | ICD-10-CM | POA: Insufficient documentation

## 2010-09-22 DIAGNOSIS — Z888 Allergy status to other drugs, medicaments and biological substances status: Secondary | ICD-10-CM

## 2010-09-22 DIAGNOSIS — Z8679 Personal history of other diseases of the circulatory system: Secondary | ICD-10-CM

## 2010-09-22 DIAGNOSIS — I2510 Atherosclerotic heart disease of native coronary artery without angina pectoris: Secondary | ICD-10-CM | POA: Diagnosis present

## 2010-09-22 DIAGNOSIS — Z7982 Long term (current) use of aspirin: Secondary | ICD-10-CM

## 2010-09-22 DIAGNOSIS — I252 Old myocardial infarction: Secondary | ICD-10-CM

## 2010-09-22 DIAGNOSIS — Z87891 Personal history of nicotine dependence: Secondary | ICD-10-CM

## 2010-09-22 DIAGNOSIS — D62 Acute posthemorrhagic anemia: Secondary | ICD-10-CM | POA: Diagnosis not present

## 2010-09-22 DIAGNOSIS — I10 Essential (primary) hypertension: Secondary | ICD-10-CM | POA: Diagnosis present

## 2010-09-22 HISTORY — PX: CORONARY ARTERY BYPASS GRAFT: SHX141

## 2010-09-22 HISTORY — DX: Personal history of other diseases of the circulatory system: Z86.79

## 2010-09-22 HISTORY — PX: AORTIC ROOT REPLACEMENT: SHX1178

## 2010-09-22 HISTORY — DX: Presence of aortocoronary bypass graft: Z95.1

## 2010-09-22 LAB — POCT I-STAT 3, ART BLOOD GAS (G3+)
Acid-base deficit: 2 mmol/L (ref 0.0–2.0)
Bicarbonate: 24.6 mEq/L — ABNORMAL HIGH (ref 20.0–24.0)
Bicarbonate: 26.6 mEq/L — ABNORMAL HIGH (ref 20.0–24.0)
Bicarbonate: 25.2 mEq/L — ABNORMAL HIGH (ref 20.0–24.0)
Bicarbonate: 23.1 mEq/L (ref 20.0–24.0)
O2 Saturation: 100 %
O2 Saturation: 100 %
O2 Saturation: 98 %
O2 Saturation: 91 %
O2 Saturation: 96 %
Patient temperature: 37
TCO2: 28 mmol/L (ref 0–100)
TCO2: 26 mmol/L (ref 0–100)
TCO2: 24 mmol/L (ref 0–100)
TCO2: 25 mmol/L (ref 0–100)
TCO2: 24 mmol/L (ref 0–100)
pCO2 arterial: 40 mmHg (ref 35.0–45.0)
pCO2 arterial: 52.2 mmHg — ABNORMAL HIGH (ref 35.0–45.0)
pCO2 arterial: 39.7 mmHg (ref 35.0–45.0)
pCO2 arterial: 40.5 mmHg (ref 35.0–45.0)
pH, Arterial: 7.397 (ref 7.350–7.450)
pH, Arterial: 7.363 (ref 7.350–7.450)
pH, Arterial: 7.399 (ref 7.350–7.450)
pO2, Arterial: 275 mmHg — ABNORMAL HIGH (ref 80.0–100.0)
pO2, Arterial: 449 mmHg — ABNORMAL HIGH (ref 80.0–100.0)
pO2, Arterial: 104 mmHg — ABNORMAL HIGH (ref 80.0–100.0)
pO2, Arterial: 58 mmHg — ABNORMAL LOW (ref 80.0–100.0)

## 2010-09-22 LAB — POCT I-STAT 3, VENOUS BLOOD GAS (G3P V)
Bicarbonate: 26.6 mEq/L — ABNORMAL HIGH (ref 20.0–24.0)
O2 Saturation: 66 %
TCO2: 28 mmol/L (ref 0–100)

## 2010-09-22 LAB — POCT I-STAT 4, (NA,K, GLUC, HGB,HCT)
Glucose, Bld: 104 mg/dL — ABNORMAL HIGH (ref 70–99)
HCT: 31 % — ABNORMAL LOW (ref 39.0–52.0)
HCT: 30 % — ABNORMAL LOW (ref 39.0–52.0)
HCT: 38 % — ABNORMAL LOW (ref 39.0–52.0)
Hemoglobin: 10.2 g/dL — ABNORMAL LOW (ref 13.0–17.0)
Hemoglobin: 10.2 g/dL — ABNORMAL LOW (ref 13.0–17.0)
Potassium: 4.6 mEq/L (ref 3.5–5.1)
Potassium: 4.6 mEq/L (ref 3.5–5.1)
Potassium: 4.9 mEq/L (ref 3.5–5.1)
Sodium: 137 mEq/L (ref 135–145)
Sodium: 140 mEq/L (ref 135–145)
Sodium: 134 mEq/L — ABNORMAL LOW (ref 135–145)
Sodium: 137 mEq/L (ref 135–145)
Sodium: 140 mEq/L (ref 135–145)

## 2010-09-22 LAB — CBC
HCT: 36 % — ABNORMAL LOW (ref 39.0–52.0)
Hemoglobin: 12.5 g/dL — ABNORMAL LOW (ref 13.0–17.0)
MCH: 28.4 pg (ref 26.0–34.0)
MCHC: 34.7 g/dL (ref 30.0–36.0)
MCV: 81.8 fL (ref 78.0–100.0)
RBC: 4.4 MIL/uL (ref 4.22–5.81)

## 2010-09-22 LAB — HEMOGLOBIN AND HEMATOCRIT, BLOOD: Hemoglobin: 10.7 g/dL — ABNORMAL LOW (ref 13.0–17.0)

## 2010-09-22 LAB — POCT I-STAT GLUCOSE
Glucose, Bld: 130 mg/dL — ABNORMAL HIGH (ref 70–99)
Operator id: 173792
Operator id: 3406

## 2010-09-22 LAB — GLUCOSE, CAPILLARY
Glucose-Capillary: 152 mg/dL — ABNORMAL HIGH (ref 70–99)
Glucose-Capillary: 121 mg/dL — ABNORMAL HIGH (ref 70–99)

## 2010-09-22 LAB — PLATELET COUNT: Platelets: 103 10*3/uL — ABNORMAL LOW (ref 150–400)

## 2010-09-23 ENCOUNTER — Inpatient Hospital Stay (HOSPITAL_COMMUNITY): Payer: 59

## 2010-09-23 LAB — MAGNESIUM
Magnesium: 2.4 mg/dL (ref 1.5–2.5)
Magnesium: 2.6 mg/dL — ABNORMAL HIGH (ref 1.5–2.5)

## 2010-09-23 LAB — CBC
HCT: 31.1 % — ABNORMAL LOW (ref 39.0–52.0)
Hemoglobin: 10.7 g/dL — ABNORMAL LOW (ref 13.0–17.0)
MCH: 28.5 pg (ref 26.0–34.0)
MCH: 28.7 pg (ref 26.0–34.0)
MCHC: 34.4 g/dL (ref 30.0–36.0)
MCHC: 35.4 g/dL (ref 30.0–36.0)
MCHC: 35.5 g/dL (ref 30.0–36.0)
MCV: 80.9 fL (ref 78.0–100.0)
MCV: 81.4 fL (ref 78.0–100.0)
MCV: 82.9 fL (ref 78.0–100.0)
Platelets: 122 10*3/uL — ABNORMAL LOW (ref 150–400)
Platelets: 125 10*3/uL — ABNORMAL LOW (ref 150–400)
RBC: 3.75 MIL/uL — ABNORMAL LOW (ref 4.22–5.81)
RDW: 13.5 % (ref 11.5–15.5)
RDW: 13.5 % (ref 11.5–15.5)
WBC: 12.3 10*3/uL — ABNORMAL HIGH (ref 4.0–10.5)
WBC: 14.3 10*3/uL — ABNORMAL HIGH (ref 4.0–10.5)

## 2010-09-23 LAB — PREPARE PLATELET PHERESIS
Unit division: 0
Unit division: 0

## 2010-09-23 LAB — PREPARE FRESH FROZEN PLASMA
Unit division: 0
Unit division: 0
Unit division: 0

## 2010-09-23 LAB — GLUCOSE, CAPILLARY
Glucose-Capillary: 123 mg/dL — ABNORMAL HIGH (ref 70–99)
Glucose-Capillary: 123 mg/dL — ABNORMAL HIGH (ref 70–99)
Glucose-Capillary: 126 mg/dL — ABNORMAL HIGH (ref 70–99)

## 2010-09-23 LAB — BASIC METABOLIC PANEL
BUN: 13 mg/dL (ref 6–23)
Calcium: 8.6 mg/dL (ref 8.4–10.5)
Chloride: 105 mEq/L (ref 96–112)
Creatinine, Ser: 0.64 mg/dL (ref 0.50–1.35)
Creatinine, Ser: 0.68 mg/dL (ref 0.50–1.35)
GFR calc Af Amer: >60 mL/min (ref >60)
GFR calc non Af Amer: >60 mL/min (ref >60)
GFR calc non Af Amer: >60 mL/min (ref >60)
Potassium: 3.6 mEq/L (ref 3.5–5.1)
Sodium: 137 mEq/L (ref 135–145)

## 2010-09-23 LAB — POCT I-STAT, CHEM 8
BUN: 12 mg/dL (ref 6–23)
Calcium, Ion: 1.18 mmol/L (ref 1.12–1.32)
Creatinine, Ser: 0.9 mg/dL (ref 0.50–1.35)
Glucose, Bld: 109 mg/dL — ABNORMAL HIGH (ref 70–99)
Glucose, Bld: 135 mg/dL — ABNORMAL HIGH (ref 70–99)
HCT: 30 % — ABNORMAL LOW (ref 39.0–52.0)
Hemoglobin: 10.2 g/dL — ABNORMAL LOW (ref 13.0–17.0)
Hemoglobin: 10.2 g/dL — ABNORMAL LOW (ref 13.0–17.0)
Potassium: 3.7 mEq/L (ref 3.5–5.1)
Sodium: 137 mEq/L (ref 135–145)
TCO2: 23 mmol/L (ref 0–100)

## 2010-09-23 LAB — CREATININE, SERUM: Creatinine, Ser: 0.85 mg/dL (ref 0.50–1.35)

## 2010-09-23 NOTE — Op Note (Signed)
NAMEMarland Kitchen  JEYDAN, BARNER [name]:  [id number]  MEDICAL RECORD [name]:  [id number]  LOCATION:  2307                         FACILITY:  [facility]  PHYSICIAN:  [name], M.D. DATE OF BIRTH:  Jun 23, 1955  DATE OF PROCEDURE:  09/22/2010 DATE OF DISCHARGE:                              OPERATIVE REPORT   PREOPERATIVE DIAGNOSES: 1. Ascending thoracic aortic aneurysm. 2. Two-vessel coronary artery disease.  POSTOPERATIVE DIAGNOSES: 1. Ascending thoracic aortic aneurysm. 2. Two-vessel coronary artery disease.  PROCEDURE:  Median sternotomy for valve-sparing aortic root replacement Onalee Hua I reimplantation technique) with resection of engrafting of the ascending thoracic aorta (using deep hypothermia and low-flow antegrade cerebral partial circulatory arrest), and coronary artery bypass grafting x2 (left internal mammary artery to distal left anterior descending coronary artery, saphenous vein graft to posterior descending coronary artery, endoscopic saphenous vein harvest from right thigh).  SURGEON:  [name], MD  ASSISTANT:  Kerin Perna, MD  SECOND ASSISTANT:  Coral Ceo, PA  ANESTHESIA:  Achille Rich, MD  BRIEF CLINICAL NOTE:  The patient is a 55 year old male who was previously found to have aneurysm involving the aortic root and ascending thoracic aorta on routine chest x-ray and followup chest CT scan.  The patient later suffered an acute myocardial infarction after having undergone elective surgery on his right ear.  Cardiac catheterization was found to reveal severe two-vessel coronary artery disease with mild left ventricular dysfunction.  The patient has recovered from his ear surgery and previous myocardial infarction, now presents for elective surgical intervention.  Alternative treatment strategies have been discussed.  The patient and his wife understand and accept all associated risks and desire to proceed with surgery  as described.  OPERATIVE FINDINGS: 1. Mild left ventricular dysfunction with inferior wall hypokinesis. 2. Fusiform aneurysmal dilatation of the entire ascending thoracic     aorta including the aortic root and sinuses of Valsalva. 3. No aortic insufficiency following successful valve-sparing aortic     root replacement.  OPERATIVE PROCEDURE IN DETAIL:  The patient was brought to the operating room on the above-mentioned date and central monitoring was established by the Anesthesia Team under the care and direction of Dr. Achille Rich.  Specifically, a Swan-Ganz catheter was placed through the right internal jugular approach.  A radial arterial line was placed on the left side.  Intravenous antibiotics were administered.  The patient was placed in the supine position on the operating table.  General endotracheal anesthesia was induced uneventfully.  A Foley catheter was placed.  Baseline transesophageal echocardiogram was performed by Dr. Chaney Malling.  This confirms the presence of aneurysmal enlargement of the aortic root and the proximal ascending thoracic aorta.  There was mild aortic insufficiency with a tricuspid aortic valve that appears to be functioning normally with exception of a small jet of central aortic insufficiency.  The aortic annulus measures approximately 29-30 mm.  At the level of the sinuses of Valsalva, the transverse diameter of the aorta is an excess of 5.5 cm.  At the sinotubular junction, the diameter is an excess of 4.5 cm.  There is mild central mitral regurgitation.  No other abnormalities were noted.  The patient's chest,  abdomen, both groins, and both lower extremities were prepared and draped in a sterile manner.  The greater saphenous vein was removed from the patient's right thigh using endoscopic vein harvest technique through a small incision made just above the right knee.  The saphenous vein was good-quality conduit.  After the saphenous vein has  been removed, the small incision in the right thigh was closed with absorbable suture.  A median sternotomy incision was performed and the left internal mammary artery was dissected from the chest wall and prepared for bypass grafting.  Left internal mammary artery was good-quality conduit. Following systemic heparinization, the left internal mammary artery was transected distally and noted to have excellent flow.  The pericardium was opened.  There was obvious aneurysmal enlargement of the entire ascending thoracic aorta.  The aorta tapers down to normal at the level of the transverse arch with just below the innominate artery and begins to dilate.  It is largest in the aortic root itself.  A small incision was made in the right deltopectoral groove and the right axillary artery was dissected through this incision.  Following systemic heparinization, an 8-mm Gore-Tex graft cannula was sewn in an end-to-side fashion onto the right axillary artery for arterial access. A two-stage venous cannula was placed in the right atrium.  A retrograde cardioplegic cannula was placed through the right atrium into the coronary sinus.  Cardiopulmonary bypass was begun.  The left ventricular vent was placed through the right superior pulmonary vein.  Distal target vessels were selected for coronary artery bypass graft.  The aorta was dissect away from the pulmonary artery.  An antegrade cardioplegic cannula was placed in the middle of the ascending thoracic aneurysm.  Systemic cooling was begun and the patient was cooled to 18 degrees centigrade.  The aortic crossclamp was applied and cold blood cardioplegia was delivered initially in an antegrade fashion through the aortic root.  Iced saline slush was applied for topical hypothermia and supplemental cardioplegia was administered retrograde through the coronary sinus catheter.  Repeat doses of cardioplegia were administered every 20-30 minutes  throughout the entire crossclamp portion of the operation to maintain completely flat electrocardiogram and left ventricular septal myocardial temperature below 15 degrees centigrade.  The following distal coronary anastomoses were performed: 1. The posterior descending coronary artery was grafted with a     saphenous vein graft in an end-to-side fashion.  This vessel was     diffusely diseased and chronically occluded proximally.  It     measures 1.5 mm at the site of distal bypass and was a fair-quality     target vessel. 2. The distal left anterior descending coronary artery was grafted     with left internal mammary artery in an end-to-side fashion.  This     vessel was somewhat diffusely diseased and has high-grade proximal     stenosis.  At the site of distal grafting, it is the fair-to-good     quality target vessel for grafting and a 1.5-mm probe will pass to     the apex of the heart.  The ascending aorta was transected just below the aortic crossclamp. The aortic aneurysm was now resected back to the level approximately 5-6 mm above the left main and right coronary arteries.  The aortic valve was inspected.  The aortic valve was tricuspid and fairly symmetrical. The leaflets were normal with no fenestration or abnormalities.  Valve- sparing root replacement appears feasible.  The left main and the right coronary arteries  were in their normal anatomical position, although the right coronary artery was somewhat small and located somewhat higher than usual in the right sinus of Valsalva.  At this juncture, the patient reaches 18 degrees centigrade.  The patient was placed in Trendelenburg position.  High-dose Midazolam and steroids were administered.  Cardiopulmonary bypass was temporarily discontinued and the aortic crossclamp was moved to the proximal innominate artery.  Low flow antegrade cerebral perfusion was then maintained at 500 mL an hour throughout the entire partial  circulatory arrest portion of the operation.  The distal aorta was inspected and trimmed back to a level just below the innominate artery takeoff.  A 32- mm Gelweave woven vascular prosthesis was chosen and subsequently sewn in an end-to-end fashion to the distal aorta with running 3-0 Prolene suture.  Teflon felt strip was utilized to buttress the distal suture line.  After completion of the distal anastomoses, an aortic crossclamp was placed across the proximal portion of the graft and full flow cardiopulmonary bypass was reinstituted after removal of the clamp on the innominate artery.  The distal anastomosis was inspected for hemostasis.  Total duration of the partial circulatory arrest was 15 minutes.  Attention was now dressed to the aortic root.  The aortic annulus was sized to approximately 29 mm.  A 32-mm Vascutek Gelweave Valsalva graft was chosen for graft replacement of the proximal aorta.  The left main coronary artery and the right coronary artery were mobilized on Carrel patch buttons.  The remainder of the sinuses of Valsalva were now trimmed away after dissecting down around the aortic root all way to the level of the LV outflow tract.  Care was taken while dissecting between the right ventricular outflow tract and the commissure between the left and right sinuses.  After completion of the mobilization of the aortic valve, the rim of wall of proximal aorta attached to the valve was trimmed to approximately 4 mm width circumferentially.  The proximal suture line for the graft was now performed using interrupted 2-0 Ethibond horizontal mattress pledgeted sutures.  The pledgets were placed in the subannular position and exited exterior to the valve.  The graft was trimmed and beveled along the side of the graft facing the commissure between the left and right cusp and the graft was now lowered into place and the suture was tied.  At this juncture, the immobilized valve  was examined within the graft and appears to be symmetrical and looked like it will ultimately be competent after completion of the reimplantation.  The distal suture line of the reimplantation was performed using running 4-0 Prolene suture circumferentially.  The valve was not tested with saline and appears to be perfectly competent.  The three commissures were symmetrical oriented.  The left main coronary artery was now reimplanted into the left sinus of Valsalva of the new graft after creating a circular defect in the sinus of Valsalva of the graft with thermal cautery.  The right coronary artery button was now reimplanted in a similar fashion into the right sinus of Valsalva of the new graft.  The proximal end of the graft previously sewn to the distal ascending thoracic aorta was trimmed and beveled to an appropriate length and then sewn in an end-to-end fashion to the aortic root graft.  This suture line was also reinforced with BioGlue.  The single proximal saphenous vein anastomoses was performed directly to the ascending thoracic aortic graft.  The lungs were ventilated and heart allowed to fill while  the left internal mammary artery graft was released.  The left ventricular septal temperature rises rapidly.  One final dose of warm retrograde hot shot cardioplegia was administered. The aortic crossclamp was removed after total crossclamp time of 218 minutes.  The heart began to beat spontaneously without need for cardioversion. The retrograde cardioplegic cannula was removed.  All suture lines were inspect for hemostasis and several additional pledgeted sutures were placed for hemostasis.  The graft appears to be seeing normally.  The patient was slowly rewarmed to 37 degrees centigrade temperature. Epicardial pacing wires affixed to the right ventricular free wall into the right atrial appendage.  The lungs were ventilated and the heart allowed to fill after which time the  left ventricular vent was removed.  The patient was weaned from cardiopulmonary bypass without difficulty. The patient's rhythm at separation from bypass was AV paced.  Total cardiopulmonary bypass time for the operation was 273 minutes. Postoperative transesophageal echocardiogram demonstrates mild inferior wall hypokinesis with mild-to-moderate central mitral regurgitation. The aortic valve appears to be functioning normally and there was no aortic insufficiency whatsoever.  No other abnormalities were noted.  The venous cannula was removed uneventfully.  Protamine was administered to reverse the anticoagulation.  The right axillary graft was transected and oversewn.  The mediastinum was irrigated with saline solution. Meticulous surgical hemostasis was ascertained.  The patient was transfused 2 units of fresh frozen plasma and two packs of adult platelets due to mild coagulopathy.  The coagulopathy resolved.  The mediastinum and left pleural space were drained with three chest tubes, placed through separate stab incisions inferiorly.  The pericardium and soft tissues anterior to the aortic graft were reapproximated loosely.  The sternum was closed using double-strength sternal wire.  The soft tissues anterior to the sternum were closed in multiple layers and the skin was closed with running subcuticular skin closure.  The right deltopectoral groove incision was also irrigated with saline solution and closed in multiple layers.  The patient tolerated the procedure well and was transported to the surgical intensive care unit in stable condition.  There were no intraoperative complications.  All sponge, instrument, and needle counts were verified and correct at completion of the operation.     [name], M.D.     CHO/MEDQ  D:  09/22/2010  T:  09/23/2010  Job:  [id number]  cc:   Lyn Records, M.D. Corky Crafts, MD Bryan Lemma. Manus Gunning, M.D. Carolan Shiver,  M.D.  Electronically Signed by Tressie Stalker M.D. on 09/23/2010 01:01:42 PM

## 2010-09-24 ENCOUNTER — Inpatient Hospital Stay (HOSPITAL_COMMUNITY): Payer: 59

## 2010-09-24 LAB — CBC
MCV: 82.8 fL (ref 78.0–100.0)
Platelets: 131 10*3/uL — ABNORMAL LOW (ref 150–400)
RBC: 3.73 MIL/uL — ABNORMAL LOW (ref 4.22–5.81)
RDW: 14.1 % (ref 11.5–15.5)
WBC: 14.8 10*3/uL — ABNORMAL HIGH (ref 4.0–10.5)

## 2010-09-24 LAB — GLUCOSE, CAPILLARY
Glucose-Capillary: 122 mg/dL — ABNORMAL HIGH (ref 70–99)
Glucose-Capillary: 130 mg/dL — ABNORMAL HIGH (ref 70–99)
Glucose-Capillary: 131 mg/dL — ABNORMAL HIGH (ref 70–99)
Glucose-Capillary: 141 mg/dL — ABNORMAL HIGH (ref 70–99)
Glucose-Capillary: 147 mg/dL — ABNORMAL HIGH (ref 70–99)
Glucose-Capillary: 139 mg/dL — ABNORMAL HIGH (ref 70–99)
Glucose-Capillary: 105 mg/dL — ABNORMAL HIGH (ref 70–99)
Glucose-Capillary: 126 mg/dL — ABNORMAL HIGH (ref 70–99)
Glucose-Capillary: 114 mg/dL — ABNORMAL HIGH (ref 70–99)
Glucose-Capillary: 102 mg/dL — ABNORMAL HIGH (ref 70–99)
Glucose-Capillary: 90 mg/dL (ref 70–99)
Glucose-Capillary: 111 mg/dL — ABNORMAL HIGH (ref 70–99)
Glucose-Capillary: 111 mg/dL — ABNORMAL HIGH (ref 70–99)
Glucose-Capillary: 112 mg/dL — ABNORMAL HIGH (ref 70–99)
Glucose-Capillary: 106 mg/dL — ABNORMAL HIGH (ref 70–99)
Glucose-Capillary: 132 mg/dL — ABNORMAL HIGH (ref 70–99)

## 2010-09-24 LAB — BASIC METABOLIC PANEL
CO2: 28 mEq/L (ref 19–32)
Chloride: 98 mEq/L (ref 96–112)
Creatinine, Ser: 0.91 mg/dL (ref 0.50–1.35)
GFR calc Af Amer: >60 mL/min (ref >60)
Potassium: 3.7 mEq/L (ref 3.5–5.1)
Sodium: 133 mEq/L — ABNORMAL LOW (ref 135–145)

## 2010-09-25 ENCOUNTER — Inpatient Hospital Stay (HOSPITAL_COMMUNITY): Payer: 59

## 2010-09-25 LAB — BASIC METABOLIC PANEL
BUN: 17 mg/dL (ref 6–23)
Calcium: 8.9 mg/dL (ref 8.4–10.5)
GFR calc non Af Amer: >60 mL/min (ref >60)
Glucose, Bld: 118 mg/dL — ABNORMAL HIGH (ref 70–99)
Sodium: 137 mEq/L (ref 135–145)

## 2010-09-25 LAB — CBC
HCT: 28.1 % — ABNORMAL LOW (ref 39.0–52.0)
MCV: 83.6 fL (ref 78.0–100.0)
RDW: 14 % (ref 11.5–15.5)
WBC: 10.6 10*3/uL — ABNORMAL HIGH (ref 4.0–10.5)
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
## 2010-09-30 ENCOUNTER — Encounter: Payer: Self-pay | Admitting: Thoracic Surgery (Cardiothoracic Vascular Surgery)

## 2010-09-30 ENCOUNTER — Ambulatory Visit (INDEPENDENT_AMBULATORY_CARE_PROVIDER_SITE_OTHER): Payer: Self-pay

## 2010-09-30 ENCOUNTER — Encounter: Payer: Self-pay | Admitting: *Deleted

## 2010-09-30 DIAGNOSIS — N400 Enlarged prostate without lower urinary tract symptoms: Secondary | ICD-10-CM | POA: Insufficient documentation

## 2010-09-30 DIAGNOSIS — E785 Hyperlipidemia, unspecified: Secondary | ICD-10-CM | POA: Insufficient documentation

## 2010-09-30 DIAGNOSIS — I712 Thoracic aortic aneurysm, without rupture: Secondary | ICD-10-CM

## 2010-09-30 DIAGNOSIS — I25709 Atherosclerosis of coronary artery bypass graft(s), unspecified, with unspecified angina pectoris: Secondary | ICD-10-CM | POA: Insufficient documentation

## 2010-09-30 DIAGNOSIS — H9193 Unspecified hearing loss, bilateral: Secondary | ICD-10-CM | POA: Insufficient documentation

## 2010-09-30 DIAGNOSIS — I2510 Atherosclerotic heart disease of native coronary artery without angina pectoris: Secondary | ICD-10-CM

## 2010-09-30 HISTORY — PX: PROSTATE SURGERY: SHX751
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
## 2010-10-13 ENCOUNTER — Other Ambulatory Visit: Payer: Self-pay | Admitting: Thoracic Surgery (Cardiothoracic Vascular Surgery)

## 2010-10-13 DIAGNOSIS — I2510 Atherosclerotic heart disease of native coronary artery without angina pectoris: Secondary | ICD-10-CM

## 2010-10-16 ENCOUNTER — Ambulatory Visit (INDEPENDENT_AMBULATORY_CARE_PROVIDER_SITE_OTHER): Payer: Self-pay | Admitting: Thoracic Surgery (Cardiothoracic Vascular Surgery)

## 2010-10-16 ENCOUNTER — Encounter: Payer: Self-pay | Admitting: Thoracic Surgery (Cardiothoracic Vascular Surgery)

## 2010-10-16 ENCOUNTER — Ambulatory Visit
Admission: RE | Admit: 2010-10-16 | Discharge: 2010-10-16 | Disposition: A | Discharge status: Home / Self Care | Payer: 59 | Source: Ambulatory Visit | Attending: Thoracic Surgery (Cardiothoracic Vascular Surgery) | Admitting: Thoracic Surgery (Cardiothoracic Vascular Surgery)

## 2010-10-16 VITALS — BP 133/85 | HR 85 | Resp 18 | Ht 75.0 in | Wt 310.0 lb

## 2010-10-16 DIAGNOSIS — I712 Thoracic aortic aneurysm, without rupture, unspecified: Secondary | ICD-10-CM

## 2010-10-16 DIAGNOSIS — J90 Pleural effusion, not elsewhere classified: Secondary | ICD-10-CM

## 2010-10-16 DIAGNOSIS — H7011 Chronic mastoiditis, right ear: Secondary | ICD-10-CM | POA: Insufficient documentation

## 2010-10-16 DIAGNOSIS — I2510 Atherosclerotic heart disease of native coronary artery without angina pectoris: Secondary | ICD-10-CM

## 2010-10-16 NOTE — Patient Instructions (Signed)
The patient has been reminded to continue to avoid any heavy lifting or strenuous use of arms or shoulders for at least a total of three months from the time of surgery. The patient has been instructed that they may return driving an automobile as long as they are no longer requiring oral narcotic pain relievers during the daytime.  They have been advised to start driving short distances during the daylight and gradually increase from there as they feel comfortable.  

## 2010-10-16 NOTE — Progress Notes (Signed)
  HPI  Patient returns for routine postoperative follow-up having undergone valve sparing aortic root replacement on 09/22/2010. The patient's early postoperative recovery while in the hospital was notable for entirely unremarkable recovery. Since hospital discharge the patient reports doing very well. He has not taken any sort of pain relievers since he left the hospital and he reports that he is really had very little pain. He has no shortness of breath. He is ambulating every day and now walking as much as a mile at a time without any sort of limitation. His appetite is fairly good. He start him to sleep at night although he is still not sleeping very well. Overall he has no complaints. He tentatively plans to start the cardiac rehabilitation program next week..    Current Outpatient Prescriptions  Medication Sig Dispense Refill  . aspirin 325 MG EC tablet Take 325 mg by mouth daily.        . carvedilol (COREG) 12.5 MG tablet Take 12.5 mg by mouth 2 (two) times daily with a meal.        . nitroGLYCERIN (NITROSTAT) 0.4 MG SL tablet Place 0.4 mg under the tongue every 5 (five) minutes as needed.        . rosuvastatin (CRESTOR) 10 MG tablet Take 10 mg by mouth daily.        Marland Kitchen telmisartan (MICARDIS) 40 MG tablet Take 40 mg by mouth daily.            Physical Exam Physical examination is notable for a well-appearing male in no acute distress. Examination of the chest reveals a median sternotomy incision that is healing nicely. Breath sounds are clear to auscultation although slightly diminished at the left lung base. Cardiovascular exam is notable for regular rate and rhythm. No murmurs rubs nor gallops are appreciated. The abdomen is soft and nontender. Extremities are warm and well-perfused. There is no lower extremity edema.  Diagnostic tests:  C[facility] was reviewed. This demonstrates [place] new small to moderate-sized left pleural effusion. The  lung fields are otherwise clear. All the sternal wires appear intact.  Impression: Patient appears to be recovering very nicely now approximately 3 weeks following valve sparing aortic root replacement and coronary artery bypass grafting. His physical recovery is quite good. He does have a new small to moderate sized pleural effusion on the left side on followup chest x-ray today. He is asymptomatic with respect to this effusion. Overall he looks remarkably good.  Plan:  I've encouraged the patient to continue to gradually increase his physical activity as tolerated.The patient has been instructed that they may return driving an automobile as long as they are no longer requiring oral narcotic pain relievers during the daytime.  They have been advised to start driving short distances during the daylight and gradually increase from there as they feel comfortable.The patient has been reminded to continue to avoid any heavy lifting or strenuous use of arms or shoulders for at least a total of three months from the time of surgery. We will plan to have him return to see [place] in 6 weeks with a followup chest x-ray.

## 2010-10-26 ENCOUNTER — Encounter (HOSPITAL_COMMUNITY)
Admission: RE | Admit: 2010-10-26 | Discharge: 2010-10-26 | Disposition: A | Discharge status: Home / Self Care | Payer: 59 | Source: Ambulatory Visit | Attending: Interventional Cardiology | Admitting: Interventional Cardiology

## 2010-10-26 DIAGNOSIS — Z951 Presence of aortocoronary bypass graft: Secondary | ICD-10-CM | POA: Insufficient documentation

## 2010-10-26 DIAGNOSIS — Z87891 Personal history of nicotine dependence: Secondary | ICD-10-CM | POA: Insufficient documentation

## 2010-10-26 DIAGNOSIS — Z5189 Encounter for other specified aftercare: Secondary | ICD-10-CM | POA: Insufficient documentation

## 2010-10-26 DIAGNOSIS — I712 Thoracic aortic aneurysm, without rupture, unspecified: Secondary | ICD-10-CM | POA: Insufficient documentation

## 2010-10-26 DIAGNOSIS — I10 Essential (primary) hypertension: Secondary | ICD-10-CM | POA: Insufficient documentation

## 2010-10-26 DIAGNOSIS — Z888 Allergy status to other drugs, medicaments and biological substances status: Secondary | ICD-10-CM | POA: Insufficient documentation

## 2010-10-26 DIAGNOSIS — I2510 Atherosclerotic heart disease of native coronary artery without angina pectoris: Secondary | ICD-10-CM | POA: Insufficient documentation

## 2010-10-26 DIAGNOSIS — I252 Old myocardial infarction: Secondary | ICD-10-CM | POA: Insufficient documentation

## 2010-10-26 DIAGNOSIS — Z7982 Long term (current) use of aspirin: Secondary | ICD-10-CM | POA: Insufficient documentation

## 2010-10-28 ENCOUNTER — Encounter (HOSPITAL_COMMUNITY): Payer: 59

## 2010-10-30 ENCOUNTER — Encounter (HOSPITAL_COMMUNITY): Payer: 59

## 2010-11-02 ENCOUNTER — Encounter (HOSPITAL_COMMUNITY): Payer: 59

## 2010-11-04 ENCOUNTER — Encounter (HOSPITAL_COMMUNITY): Payer: 59

## 2010-11-06 ENCOUNTER — Encounter (HOSPITAL_COMMUNITY): Payer: 59

## 2010-11-06 LAB — BASIC METABOLIC PANEL
BUN: 14
CO2: 25
Chloride: 106
Glucose, Bld: 113 — ABNORMAL HIGH
Potassium: 3.6

## 2010-11-06 LAB — URINE MICROSCOPIC-ADD ON

## 2010-11-06 LAB — URINE CULTURE

## 2010-11-06 LAB — URINALYSIS, ROUTINE W REFLEX MICROSCOPIC
Bilirubin Urine: NEGATIVE
Glucose, UA: NEGATIVE
pH: 6

## 2010-11-09 ENCOUNTER — Encounter (HOSPITAL_COMMUNITY): Payer: 59

## 2010-11-11 ENCOUNTER — Encounter (HOSPITAL_COMMUNITY): Payer: 59

## 2010-11-13 ENCOUNTER — Encounter (HOSPITAL_COMMUNITY): Payer: 59

## 2010-11-16 ENCOUNTER — Encounter (HOSPITAL_COMMUNITY): Payer: 59

## 2010-11-17 ENCOUNTER — Other Ambulatory Visit: Payer: Self-pay | Admitting: Thoracic Surgery (Cardiothoracic Vascular Surgery)

## 2010-11-17 DIAGNOSIS — J90 Pleural effusion, not elsewhere classified: Secondary | ICD-10-CM

## 2010-11-17 DIAGNOSIS — I712 Thoracic aortic aneurysm, without rupture: Secondary | ICD-10-CM

## 2010-11-18 ENCOUNTER — Encounter (HOSPITAL_COMMUNITY): Payer: 59 | Attending: Interventional Cardiology

## 2010-11-18 DIAGNOSIS — I712 Thoracic aortic aneurysm, without rupture, unspecified: Secondary | ICD-10-CM | POA: Insufficient documentation

## 2010-11-18 DIAGNOSIS — Z87891 Personal history of nicotine dependence: Secondary | ICD-10-CM | POA: Insufficient documentation

## 2010-11-18 DIAGNOSIS — I10 Essential (primary) hypertension: Secondary | ICD-10-CM | POA: Insufficient documentation

## 2010-11-18 DIAGNOSIS — Z7982 Long term (current) use of aspirin: Secondary | ICD-10-CM | POA: Insufficient documentation

## 2010-11-18 DIAGNOSIS — Z951 Presence of aortocoronary bypass graft: Secondary | ICD-10-CM | POA: Insufficient documentation

## 2010-11-18 DIAGNOSIS — Z5189 Encounter for other specified aftercare: Secondary | ICD-10-CM | POA: Insufficient documentation

## 2010-11-18 DIAGNOSIS — Z888 Allergy status to other drugs, medicaments and biological substances status: Secondary | ICD-10-CM | POA: Insufficient documentation

## 2010-11-18 DIAGNOSIS — I2510 Atherosclerotic heart disease of native coronary artery without angina pectoris: Secondary | ICD-10-CM | POA: Insufficient documentation

## 2010-11-18 DIAGNOSIS — I252 Old myocardial infarction: Secondary | ICD-10-CM | POA: Insufficient documentation

## 2010-11-20 ENCOUNTER — Encounter (HOSPITAL_COMMUNITY): Payer: 59

## 2010-11-23 ENCOUNTER — Ambulatory Visit
Admission: RE | Admit: 2010-11-23 | Discharge: 2010-11-23 | Disposition: A | Discharge status: Home / Self Care | Payer: 59 | Source: Ambulatory Visit | Attending: Thoracic Surgery (Cardiothoracic Vascular Surgery) | Admitting: Thoracic Surgery (Cardiothoracic Vascular Surgery)

## 2010-11-23 ENCOUNTER — Encounter: Payer: Self-pay | Admitting: Thoracic Surgery (Cardiothoracic Vascular Surgery)

## 2010-11-23 ENCOUNTER — Encounter (HOSPITAL_COMMUNITY): Payer: 59

## 2010-11-23 ENCOUNTER — Ambulatory Visit (INDEPENDENT_AMBULATORY_CARE_PROVIDER_SITE_OTHER): Payer: Self-pay | Admitting: Thoracic Surgery (Cardiothoracic Vascular Surgery)

## 2010-11-23 VITALS — BP 135/89 | HR 88 | Resp 16 | Ht 75.0 in | Wt 306.0 lb

## 2010-11-23 DIAGNOSIS — I2510 Atherosclerotic heart disease of native coronary artery without angina pectoris: Secondary | ICD-10-CM

## 2010-11-23 DIAGNOSIS — I712 Thoracic aortic aneurysm, without rupture: Secondary | ICD-10-CM

## 2010-11-23 DIAGNOSIS — J90 Pleural effusion, not elsewhere classified: Secondary | ICD-10-CM

## 2010-11-23 NOTE — Patient Instructions (Signed)
Go to Dr. Michaelle Copas office today.

## 2010-11-23 NOTE — Progress Notes (Signed)
Patient returns for further followup status post valve sparing aortic root replacement with resection and grafting of a descending thoracic aortic aneurysm and coronary artery bypass grafting x2 on 09/22/2010. He was last seen here in the office 10/16/2010. At that point he was doing very well. Since then he underwent a followup echocardiogram at Dr. Michaelle Copas office on 10/30/2010. By report there was no aortic regurgitation. Left ventricular function was essentially normal with ejection fraction estimated at 50-55%. No other significant problems or abnormalities were noted. There was no pericardial effusion. At that time the patient was doing well and had started the cardiac rehabilitation program. However, over the last couple of weeks he has noticed a decline in his functional status.  He now gets short of breath much more easily than he had been previously. He has developed some abdominal swelling and severe bilateral lower extremity edema. He was seen in Dr. Michaelle Copas office last week and apparently told that he was in atrial fibrillation. His dose of carvedilol was increased to 25 mg twice daily. He returns to our office for further followup today. He reports no improvement. He's not having any chest pain. His appetite is okay. He has no other complaints.  On physical exam the patient is in atrial fibrillation with heart rate between 80 and 100. His sternal scar is healing nicely and the sternum is stable on palpation. Breath sounds are clear to auscultation. No wheezes rales or rhonchi are noted. Breath sounds are symmetrical bilaterally. Cardiovascular exam is notable for irregular heart rhythm. No murmurs rubs or gallops are noted. The abdomen is soft and nontender. Extremities are warm and well-perfused. There is moderate bilateral lower extremity edema, more so on the left than the right.  C[facility] x-ray performed [facility] is reviewed. This reveals increased pulmonary vascular  congestion with small bilateral pleural effusions.  The patient had been doing quite well but now has gone into atrial fibrillation. He is quite symptomatic and now has significant long overload.  Plan: I discussed matters briefly over the telephone with Dr. Katrinka Blazing. The patient will go directly to his office to be seen today for further adjustment to his medications. I suspect that he will need diuretic therapy initiated. He may he may need to be anticoagulated with Coumadin. Amiodarone is an option. We will defer subsequent decision making to Dr. Katrinka Blazing. We'll see the patient back in 4 weeks for further followup.

## 2010-11-25 ENCOUNTER — Encounter (HOSPITAL_COMMUNITY): Payer: 59

## 2010-11-27 ENCOUNTER — Encounter (HOSPITAL_COMMUNITY): Payer: 59

## 2010-11-30 ENCOUNTER — Encounter (HOSPITAL_COMMUNITY): Payer: 59

## 2010-12-02 ENCOUNTER — Encounter (HOSPITAL_COMMUNITY): Payer: 59

## 2010-12-04 ENCOUNTER — Encounter (HOSPITAL_COMMUNITY): Payer: 59

## 2010-12-07 ENCOUNTER — Encounter (HOSPITAL_COMMUNITY): Payer: 59

## 2010-12-09 ENCOUNTER — Encounter (HOSPITAL_COMMUNITY): Payer: 59

## 2010-12-11 ENCOUNTER — Encounter (HOSPITAL_COMMUNITY): Payer: 59

## 2010-12-14 ENCOUNTER — Encounter (HOSPITAL_COMMUNITY): Payer: 59

## 2010-12-16 ENCOUNTER — Encounter (HOSPITAL_COMMUNITY): Payer: 59

## 2010-12-16 ENCOUNTER — Other Ambulatory Visit: Payer: Self-pay | Admitting: Thoracic Surgery (Cardiothoracic Vascular Surgery)

## 2010-12-16 DIAGNOSIS — I2510 Atherosclerotic heart disease of native coronary artery without angina pectoris: Secondary | ICD-10-CM

## 2010-12-18 ENCOUNTER — Ambulatory Visit (HOSPITAL_COMMUNITY)
Admission: RE | Admit: 2010-12-18 | Discharge: 2010-12-18 | Disposition: A | Discharge status: Home / Self Care | Payer: 59 | Source: Ambulatory Visit | Attending: Interventional Cardiology | Admitting: Interventional Cardiology

## 2010-12-18 ENCOUNTER — Other Ambulatory Visit: Payer: Self-pay

## 2010-12-18 ENCOUNTER — Encounter (HOSPITAL_COMMUNITY): Payer: 59

## 2010-12-18 DIAGNOSIS — Z951 Presence of aortocoronary bypass graft: Secondary | ICD-10-CM | POA: Insufficient documentation

## 2010-12-18 DIAGNOSIS — I4891 Unspecified atrial fibrillation: Secondary | ICD-10-CM | POA: Insufficient documentation

## 2010-12-18 LAB — PROTIME-INR: Prothrombin Time: 19.3 seconds — ABNORMAL HIGH (ref 11.6–15.2)

## 2010-12-21 ENCOUNTER — Encounter: Payer: Self-pay | Admitting: Thoracic Surgery (Cardiothoracic Vascular Surgery)

## 2010-12-21 ENCOUNTER — Encounter (HOSPITAL_COMMUNITY): Payer: 59

## 2010-12-21 ENCOUNTER — Telehealth (HOSPITAL_COMMUNITY): Payer: Self-pay | Admitting: Cardiac Rehabilitation

## 2010-12-21 ENCOUNTER — Ambulatory Visit (INDEPENDENT_AMBULATORY_CARE_PROVIDER_SITE_OTHER): Payer: Self-pay | Admitting: Thoracic Surgery (Cardiothoracic Vascular Surgery)

## 2010-12-21 VITALS — BP 126/79 | HR 50 | Resp 20 | Ht 75.0 in | Wt 298.0 lb

## 2010-12-21 DIAGNOSIS — I4891 Unspecified atrial fibrillation: Secondary | ICD-10-CM

## 2010-12-21 DIAGNOSIS — I2510 Atherosclerotic heart disease of native coronary artery without angina pectoris: Secondary | ICD-10-CM

## 2010-12-21 DIAGNOSIS — I712 Thoracic aortic aneurysm, without rupture, unspecified: Secondary | ICD-10-CM

## 2010-12-21 NOTE — Telephone Encounter (Signed)
Entered in error

## 2010-12-21 NOTE — Progress Notes (Signed)
Patient returns to the office today for further followup status post aortic root replacement and coronary artery bypass grafting x2. He was last seen here in the office 4 weeks ago when he was noted to be in atrial fibrillation. He was started on both amiodarone and Xarelto.  He underwent DC cardioversion this past Friday. He tolerated this well and has felt much better since then. He reports no soreness in his chest. He has no shortness of breath. His appetite is good. His activity level is quite good and in fact he has been doing some fairly strenuous physical activity. He has no other complaints.  On physical exam he appears to be in sinus rhythm. Breath sounds are clear to auscultation and symmetrical bilaterally. His sternal scar has healed nicely and the sternum is stable on palpation. The extremities are warm and well-perfused. There is no lower extremity edema. The remainder of his physical exam is unremarkable.  The patient is doing very well 2 months following aortic root replacement with the [place]alee Hua 1 reimplantation technique for sedation of the native aortic valve. He underwent DC cardioversion for postoperative atrial fibrillation and appears to be maintaining sinus rhythm so far. Clinically he is doing quite well.  We will plan to see the patient back in 4 months for routine followup. At some point a late followup echocardiogram would be reasonable to get new baseline for left ventricular function and continue to assess the competency of the aortic valve.

## 2010-12-21 NOTE — Patient Instructions (Signed)
The patient has been reminded to continue to avoid any heavy lifting or strenuous use of arms or shoulders for at least a total of three months from the time of surgery.  

## 2010-12-21 NOTE — Telephone Encounter (Signed)
Spoke to pt to inform clearance to return to cardiac rehab received from Dr. Katrinka Blazing.  Pt states he had ECV on Friday and does not feel comfortable returning to rehab until he has discussed with Dr. Katrinka Blazing at f/u appt 12/28/2010.  Will await next office visit to determine pt ability to return.  -jrion,rn

## 2010-12-23 ENCOUNTER — Encounter (HOSPITAL_COMMUNITY): Payer: 59

## 2010-12-25 ENCOUNTER — Encounter (HOSPITAL_COMMUNITY): Payer: 59

## 2010-12-28 ENCOUNTER — Encounter (HOSPITAL_COMMUNITY): Payer: 59

## 2010-12-30 ENCOUNTER — Encounter (HOSPITAL_COMMUNITY): Payer: 59 | Attending: Interventional Cardiology

## 2010-12-30 DIAGNOSIS — I2510 Atherosclerotic heart disease of native coronary artery without angina pectoris: Secondary | ICD-10-CM | POA: Insufficient documentation

## 2010-12-30 DIAGNOSIS — I252 Old myocardial infarction: Secondary | ICD-10-CM | POA: Insufficient documentation

## 2010-12-30 DIAGNOSIS — Z951 Presence of aortocoronary bypass graft: Secondary | ICD-10-CM | POA: Insufficient documentation

## 2010-12-30 DIAGNOSIS — I10 Essential (primary) hypertension: Secondary | ICD-10-CM | POA: Insufficient documentation

## 2010-12-30 DIAGNOSIS — Z7982 Long term (current) use of aspirin: Secondary | ICD-10-CM | POA: Insufficient documentation

## 2010-12-30 DIAGNOSIS — I712 Thoracic aortic aneurysm, without rupture, unspecified: Secondary | ICD-10-CM | POA: Insufficient documentation

## 2010-12-30 DIAGNOSIS — Z5189 Encounter for other specified aftercare: Secondary | ICD-10-CM | POA: Insufficient documentation

## 2010-12-30 DIAGNOSIS — Z888 Allergy status to other drugs, medicaments and biological substances status: Secondary | ICD-10-CM | POA: Insufficient documentation

## 2010-12-30 DIAGNOSIS — Z87891 Personal history of nicotine dependence: Secondary | ICD-10-CM | POA: Insufficient documentation

## 2011-01-01 ENCOUNTER — Encounter (HOSPITAL_COMMUNITY): Payer: 59

## 2011-01-04 ENCOUNTER — Encounter (HOSPITAL_COMMUNITY): Payer: 59

## 2011-01-06 ENCOUNTER — Encounter (HOSPITAL_COMMUNITY): Payer: 59

## 2011-01-08 ENCOUNTER — Encounter (HOSPITAL_COMMUNITY): Payer: 59

## 2011-01-11 ENCOUNTER — Encounter (HOSPITAL_COMMUNITY): Payer: 59

## 2011-01-13 ENCOUNTER — Encounter (HOSPITAL_COMMUNITY): Payer: 59

## 2011-01-15 ENCOUNTER — Encounter (HOSPITAL_COMMUNITY): Payer: 59

## 2011-01-18 ENCOUNTER — Encounter (HOSPITAL_COMMUNITY): Payer: 59

## 2011-01-20 ENCOUNTER — Encounter (HOSPITAL_COMMUNITY): Payer: 59

## 2011-01-22 ENCOUNTER — Encounter (HOSPITAL_COMMUNITY): Payer: 59

## 2011-01-25 ENCOUNTER — Encounter (HOSPITAL_COMMUNITY): Payer: 59

## 2011-01-27 ENCOUNTER — Encounter (HOSPITAL_COMMUNITY): Payer: 59

## 2011-01-29 ENCOUNTER — Encounter (HOSPITAL_COMMUNITY): Payer: 59

## 2011-05-03 ENCOUNTER — Encounter: Payer: Self-pay | Admitting: Thoracic Surgery (Cardiothoracic Vascular Surgery)

## 2011-05-03 ENCOUNTER — Ambulatory Visit (INDEPENDENT_AMBULATORY_CARE_PROVIDER_SITE_OTHER): Payer: 59 | Admitting: Thoracic Surgery (Cardiothoracic Vascular Surgery)

## 2011-05-03 VITALS — BP 162/90 | HR 50 | Resp 16 | Ht 75.0 in | Wt 296.0 lb

## 2011-05-03 DIAGNOSIS — I712 Thoracic aortic aneurysm, without rupture: Secondary | ICD-10-CM

## 2011-05-03 DIAGNOSIS — Z8679 Personal history of other diseases of the circulatory system: Secondary | ICD-10-CM

## 2011-05-03 DIAGNOSIS — Z951 Presence of aortocoronary bypass graft: Secondary | ICD-10-CM

## 2011-05-03 DIAGNOSIS — Z9889 Other specified postprocedural states: Secondary | ICD-10-CM

## 2011-05-03 NOTE — Progress Notes (Signed)
                   [address]            [place] [address]          [phone]     CARDIOTHORACIC SURGERY OFFICE NOTE  Referring Provider is Veatrice Kells, MD PCP is Thora Lance, MD, MD   HPI:  Patient returns for followup status post valve sparing aortic root replacement and coronary artery bypass grafting x2 on 09/22/2010. He was last seen here in this office last November. Since then he has done well and he continues to followup regularly with Dr. Katrinka Blazing. He has had 6 some difficulty getting his blood pressure under control but it seems to be gradually coming down appropriately. He otherwise has no problems and he specifically denies any problems with exertional chest pain or shortness of breath. His not had any tachypalpitations or dizzy spells. He has remained in sinus rhythm and is anticoagulation has been stopped.   Current Outpatient Prescriptions  Medication Sig Dispense Refill  . amiodarone (PACERONE) 200 MG tablet Take 200 mg by mouth daily.       Marland Kitchen amLODipine (NORVASC) 5 MG tablet Take 5 mg by mouth daily.      . carvedilol (COREG) 12.5 MG tablet Take 12.5 mg by mouth 2 (two) times daily with a meal.       . rosuvastatin (CRESTOR) 10 MG tablet Take 10 mg by mouth daily.        Marland Kitchen telmisartan-hydrochlorothiazide (MICARDIS HCT) 80-25 MG per tablet Take 1 tablet by mouth daily.          Physical Exam:   BP 162/90  Pulse 50  Resp 16  Ht 6\' 3"  (1.905 m)  Wt 296 lb (134.265 kg)  BMI 37.00 kg/m2  SpO2 97%  General:  Well-appearing  Chest:   Clear to auscultation  CV:   Regular rate and rhythm without murmur  Incisions:  Completely healed  Abdomen:  Soft and nontender  Extremities:  Warm and well-perfused  Diagnostic Tests:  n/a   Impression:  Patient is doing well 6 months following valve sparing aortic root replacement with coronary artery bypass grafting x2. He reportedly had an echocardiogram performed a few months ago that he was  told looked good. His blood pressure is still running a little bit higher than we would like it, but is gradually coming under better control. He is maintaining sinus rhythm.  Plan:  We will obtain results of echocardiogram performed recently in Dr. Michaelle Copas office. We have not made any changes in his current medications. Mr. Noe Gens has been released to return to normal physical activity. We will plan to see him back in 6 months for routine followup.    [name], MD 05/03/2011 11:03 AM

## 2011-11-08 ENCOUNTER — Encounter: Payer: Self-pay | Admitting: Thoracic Surgery (Cardiothoracic Vascular Surgery)

## 2011-11-08 ENCOUNTER — Ambulatory Visit (INDEPENDENT_AMBULATORY_CARE_PROVIDER_SITE_OTHER): Payer: 59 | Admitting: Thoracic Surgery (Cardiothoracic Vascular Surgery)

## 2011-11-08 VITALS — BP 160/94 | HR 54 | Resp 18 | Ht 75.0 in | Wt 320.0 lb

## 2011-11-08 DIAGNOSIS — Z951 Presence of aortocoronary bypass graft: Secondary | ICD-10-CM

## 2011-11-08 DIAGNOSIS — Z9889 Other specified postprocedural states: Secondary | ICD-10-CM

## 2011-11-08 DIAGNOSIS — I712 Thoracic aortic aneurysm, without rupture, unspecified: Secondary | ICD-10-CM

## 2011-11-08 DIAGNOSIS — I2510 Atherosclerotic heart disease of native coronary artery without angina pectoris: Secondary | ICD-10-CM

## 2011-11-08 DIAGNOSIS — Z8679 Personal history of other diseases of the circulatory system: Secondary | ICD-10-CM

## 2011-11-08 NOTE — Progress Notes (Signed)
                   [address]            [place] [address]          [phone]     CARDIOTHORACIC SURGERY OFFICE NOTE  Referring Provider is Veatrice Kells, MD PCP is Thora Lance, MD   HPI:  Patient returns for late followup approximately one year status post valve sparing aortic root replacement and coronary artery bypass grafting x2. He was last seen here in our office 6 months ago. Since then he has done very well. He has no chest pain and no shortness of breath whatsoever. He enjoys completely normal physical activity. He reports that his blood pressure has been under better control. The remainder of his review of systems is unremarkable other than the fact that he is currently taking prednisone because he got a severe case of poison ivy within the last 2 weeks.   Current Outpatient Prescriptions  Medication Sig Dispense Refill  . amLODipine (NORVASC) 5 MG tablet Take 5 mg by mouth daily.      Marland Kitchen aspirin 325 MG tablet Take 325 mg by mouth daily.      . carvedilol (COREG) 12.5 MG tablet Take 12.5 mg by mouth 2 (two) times daily with a meal.       . rosuvastatin (CRESTOR) 10 MG tablet Take 10 mg by mouth daily.        Marland Kitchen telmisartan-hydrochlorothiazide (MICARDIS HCT) 80-25 MG per tablet Take 1 tablet by mouth daily.      Marland Kitchen DISCONTD: furosemide (LASIX) 40 MG tablet Take 40 mg by mouth daily.       Marland Kitchen DISCONTD: KLOR-CON 10 10 MEQ CR tablet Take 10 mEq by mouth daily.       Marland Kitchen DISCONTD: telmisartan (MICARDIS) 40 MG tablet Take 40 mg by mouth daily.       Marland Kitchen DISCONTD: XARELTO 20 MG TABS Take 1 capsule by mouth once.           Physical Exam:   BP 160/94  Pulse 54  Resp 18  Ht 6\' 3"  (1.905 m)  Wt 320 lb (145.151 kg)  BMI 40.00 kg/m2  SpO2 97%  General:  Well-appearing  Chest:   Clear to auscultation  CV:   Regular rate and rhythm without murmur  Incisions:  Completely healed  Abdomen:  Soft and nontender  Extremities:  Warm and  well-perfused  Diagnostic Tests:  Results of most recent transthoracic echocardiogram performed at [facility] cardiology on 01/25/2011 is reviewed. By report there was moderate concentric left ventricular hypertrophy with normal left ventricular systolic function, ejection fraction estimated 50-55%. There was only trace aortic insufficiency. No other significant abnormalities were noted.   Impression:  Patient is doing very well more than one year status post valve sparing aortic root replacement and coronary artery bypass grafting x2.   Plan:  I've reminded Mr. Noe Gens that he probably should have followup echocardiogram performed annually for at least the next few years. He plans to continue to followup with Dr. Katrinka Blazing for long-term management of his hypertension and valvular heart disease. In the future he will call and return to see [place] here as needed.      [name], MD 11/08/2011 10:47 AM

## 2012-07-20 ENCOUNTER — Encounter (INDEPENDENT_AMBULATORY_CARE_PROVIDER_SITE_OTHER): Payer: Self-pay | Admitting: General Surgery

## 2012-07-20 ENCOUNTER — Ambulatory Visit (INDEPENDENT_AMBULATORY_CARE_PROVIDER_SITE_OTHER): Payer: 59 | Admitting: General Surgery

## 2012-07-20 VITALS — BP 138/86 | HR 68 | Temp 98.9°F | Resp 16 | Ht 75.0 in | Wt 325.0 lb

## 2012-07-20 DIAGNOSIS — K429 Umbilical hernia without obstruction or gangrene: Secondary | ICD-10-CM

## 2012-07-20 NOTE — Progress Notes (Signed)
Patient ID: Jacob Small, male   DOB: Oct 11, 1955, 57 y.o.   MRN: [id number]  Chief Complaint  Patient presents with  . Umbilical Hernia    new pt    HPI Jacob Small is a 57 y.o. male.   HPI 57 yo morbidly obese WM referred by Jacob Jacob Small for evaluation of an umbilical hernia. The patient states he has noticed the hernia for several years. He does not believe it is changed in size. He reports that he has had several occasions where he has noticed a pulling sensation around his umbilicus when he was doing heavy activities. The hernia has never been hard or swollen. It isn't causing any pain per se. He denies any fever, chills, nausea, vomiting, diarrhea or constipation. He denies any prior abdominal surgery. 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 HTN (hypertension)   . Bilateral hearing loss   . CAD (coronary artery disease)   . Hyperlipidemia   . CAD (coronary artery disease), native coronary artery     Aortic Root Replacement and CABGx2 on 09/22/2010  Jacob. Cornelius Small  . Obesity, morbid   . Aortic aneurysm, thoracic   . S/P CABG x 2 09/22/2010    LIMA to LAD, SVG to PDA  . S/P thoracic aortic aneurysm repair 09/22/2010    Valve-sparing aortic root replacement (David Type I)  . Myocardial infarction     Past Surgical History  Procedure Laterality Date  . Prostate surgery  09/30/10    BX  2010 NEG  . Pilonidal cyst excision    . Knee debridement      02/2008 CHONDROPLASTY/DEB. OF MENISCUS  . Aortic root replacement  09/22/2010    Median sternotomy for valve-sparing aortic root replacemnt Jacob Small I reimplanation technique) with resection of engrafting of the ascending thoracic aorta .Partial circulatoru arrest, coronaru aartery bypass grafting X2 (left intrnal mammary artery to distal left anterior descending coronary artery,  saphenous vein graft to posterrior descending coronary artery, EVH rt thigh  . Coronary artery bypass graft  09/22/2010    CABGx2 with LIMA to LAD, SVG to PDA  . Hand surgery    . External ear surgery      Family History  Problem Relation Age of Onset  . Heart disease Father   . Cancer Brother     skin    Social History History  Substance Use Topics  . Smoking status: Former Smoker    Quit date: 02/16/1995  . Smokeless tobacco: Not on file  . Alcohol Use: Yes    Allergies  Allergen Reactions  . Lisinopril Cough  . Toprol Xl (Metoprolol Succinate) Other (See Comments)    FATIGUE    Current Outpatient Prescriptions  Medication Sig Dispense Refill  . amLODipine (NORVASC) 5 MG tablet Take 5 mg by mouth daily.      Marland Kitchen aspirin 325 MG tablet Take 325 mg by mouth daily.      . carvedilol (COREG) 12.5 MG tablet Take 12.5 mg by mouth 2 (two) times daily with a meal.       . rosuvastatin (CRESTOR) 10 MG tablet Take 5 mg by mouth daily.       Marland Kitchen telmisartan-hydrochlorothiazide (MICARDIS HCT) 80-25 MG per tablet Take 1 tablet by mouth daily.      . [DISCONTINUED] furosemide (LASIX) 40 MG tablet Take  40 mg by mouth daily.       . [DISCONTINUED] KLOR-CON 10 10 MEQ CR tablet Take 10 mEq by mouth daily.       . [DISCONTINUED] telmisartan (MICARDIS) 40 MG tablet Take 40 mg by mouth daily.       . [DISCONTINUED] XARELTO 20 MG TABS Take 1 capsule by mouth once.        No current facility-administered medications for this visit.    Review of Systems Review of Systems  Constitutional: Negative for fever, chills, appetite change and unexpected weight change.  HENT: Positive for hearing loss. Negative for congestion and trouble swallowing.   Eyes: Negative for visual disturbance.  Respiratory: Negative for chest tightness and shortness of breath.   Cardiovascular: Negative for chest pain and leg swelling.       No PND, no orthopnea, no DOE  Gastrointestinal:       See HPI  Genitourinary:  Negative for dysuria and hematuria.  Musculoskeletal: Negative.   Skin: Negative for rash.  Neurological: Negative for seizures and speech difficulty.  Hematological: Does not bruise/bleed easily.  Psychiatric/Behavioral: Negative for behavioral problems and confusion.    Blood pressure 138/86, pulse 68, temperature 98.9 F (37.2 C), temperature source Temporal, resp. rate 16, height 6\' 3"  (1.905 m), weight 325 lb (147.419 kg).  Physical Exam Physical Exam  Vitals reviewed. Constitutional: He is oriented to person, place, and time. He appears well-developed and well-nourished. No distress.  Morbidly obese  HENT:  Head: Normocephalic and atraumatic.  Right Ear: External ear normal.  Left Ear: External ear normal.  Eyes: Conjunctivae are normal. No scleral icterus.  Neck: Normal range of motion. Neck supple. No tracheal deviation present. No thyromegaly present.  Cardiovascular: Normal rate, regular rhythm and normal heart sounds.   Pulmonary/Chest: Effort normal and breath sounds normal. No respiratory distress. He has no wheezes.    Abdominal: Soft. He exhibits no distension. There is no tenderness. There is no rebound. A hernia is present. Hernia confirmed positive in the ventral area.    Upper midline diastasis; small reducible nontender umbilical hernia. Defect about 1.5 - 2 cm  Musculoskeletal: He exhibits no edema and no tenderness.  Lymphadenopathy:    He has no cervical adenopathy.  Neurological: He is alert and oriented to person, place, and time.  Skin: Skin is warm and dry. No rash noted. He is not diaphoretic. No erythema.  Psychiatric: He has a normal mood and affect. His behavior is normal. Judgment and thought content normal.    Data Reviewed Jacob Small note 06/2012 Labs May 2014 cmet wnl   Assessment    Morbid obesity  Umbilical hernia  Rectus diastases    Plan    We discussed the etiology of umbilical hernias. We discussed the signs and symptoms of  incarceration and strangulation. The patient was given educational material..  We discussed nonoperative and operative management. With respect to operative management, we discussed open repair. We discussed the typical recovery course as well as restrictions after surgery.  We also discussed rectus diastases.  With respect to umbilical hernia repair, I did caution him that he was at increased risk for recurrence given his current body size. We discussed the importance of weight loss in order to decrease his chance of hernia recurrence.  The patient would like to defer surgical repair of his hernia until September. I think that is completely reasonable. In the interim we discussed the importance of exercise as well as proper food choices. He  tends to drink a lot of Coca-Cola. We discussed how high in calories Coca-Cola is.  He will followup in the office in August to discuss hernia surgery for September. In the interim he is going to work on his [employer]. Andrey Campanile, MD, FACS General, Bariatric, & Minimally Invasive Surgery [facility], [place]            [facility] M 07/20/2012, 10:46 AM

## 2012-07-20 NOTE — Patient Instructions (Signed)
We will see you in August to plan for surgery in September Call if you develop worsening discomfort like we discussed Cut out the sodas Work on exercise

## 2012-08-01 ENCOUNTER — Encounter (INDEPENDENT_AMBULATORY_CARE_PROVIDER_SITE_OTHER): Payer: Self-pay

## 2012-09-20 ENCOUNTER — Encounter (INDEPENDENT_AMBULATORY_CARE_PROVIDER_SITE_OTHER): Payer: 59 | Admitting: General Surgery

## 2012-10-04 ENCOUNTER — Ambulatory Visit (INDEPENDENT_AMBULATORY_CARE_PROVIDER_SITE_OTHER): Payer: 59 | Admitting: General Surgery

## 2012-10-04 ENCOUNTER — Encounter (INDEPENDENT_AMBULATORY_CARE_PROVIDER_SITE_OTHER): Payer: Self-pay | Admitting: General Surgery

## 2012-10-04 VITALS — BP 152/70 | HR 60 | Temp 97.4°F | Resp 16 | Ht 75.0 in | Wt 323.2 lb

## 2012-10-04 DIAGNOSIS — K429 Umbilical hernia without obstruction or gangrene: Secondary | ICD-10-CM

## 2012-10-04 NOTE — Patient Instructions (Signed)
Call the office in mid-September if you haven't heard anything from my office.   Hernia A hernia occurs when an internal organ pushes out through a weak spot in the abdominal wall. Hernias most commonly occur in the groin and around the navel. Hernias often can be pushed back into place (reduced). Most hernias tend to get worse over time. Some abdominal hernias can get stuck in the opening (irreducible or incarcerated hernia) and cannot be reduced. An irreducible abdominal hernia which is tightly squeezed into the opening is at risk for impaired blood supply (strangulated hernia). A strangulated hernia is a medical emergency. Because of the risk for an irreducible or strangulated hernia, surgery may be recommended to repair a hernia. CAUSES   Heavy lifting.  Prolonged coughing.  Straining to have a bowel movement.  A cut (incision) made during an abdominal surgery. HOME CARE INSTRUCTIONS   Bed rest is not required. You may continue your normal activities.  Avoid lifting more than 10 pounds (4.5 kg) or straining.  Cough gently. If you are a smoker it is best to stop. Even the best hernia repair can break down with the continual strain of coughing. Even if you do not have your hernia repaired, a cough will continue to aggravate the problem.  Do not wear anything tight over your hernia. Do not try to keep it in with an outside bandage or truss. These can damage abdominal contents if they are trapped within the hernia sac.  Eat a normal diet.  Avoid constipation. Straining over long periods of time will increase hernia size and encourage breakdown of repairs. If you cannot do this with diet alone, stool softeners may be used. SEEK IMMEDIATE MEDICAL CARE IF:   You have a fever.  You develop increasing abdominal pain.  You feel nauseous or vomit.  Your hernia is stuck outside the abdomen, looks discolored, feels hard, or is tender.  You have any changes in your bowel habits or in the  hernia that are unusual for you.  You have increased pain or swelling around the hernia.  You cannot push the hernia back in place by applying gentle pressure while lying down. MAKE SURE YOU:   Understand these instructions.  Will watch your condition.  Will get help right away if you are not doing well or get worse. Document Released: 02/01/2005 Document Revised: 04/26/2011 Document Reviewed: 09/21/2007 [facility] Patient Information 2014 [place], [place].

## 2012-10-04 NOTE — Progress Notes (Signed)
Subjective:     Patient ID: Jacob Small, male   DOB: 1956/01/29, 57 y.o.   MRN: [id number]  HPI 57 year old gentleman comes in for followup regarding his known umbilical hernia. I saw him a few months ago and he wanted to put off surgery until September or October.  He states he has been doing well. He denies any fever, chills, nausea or vomiting. He denies any  Diarrhea or constipation. He denies any abdominal pain. He states that he felt the hernia went away yesterday morning. However the bulge returned last night.  PMHx, PSHx, SOCHx, FAMHx, ALL reviewed and unchanged  Review of Systems 10 point ROS performed and negative except for HPI    Objective:   Physical Exam BP 152/70  Pulse 60  Temp(Src) 97.4 F (36.3 C) (Temporal)  Resp 16  Ht 6\' 3"  (1.905 m)  Wt 323 lb 3.2 oz (146.603 kg)  BMI 40.4 kg/m2  Gen: alert, NAD, non-toxic appearing, morbidly obese Pupils: equal, no scleral icterus Pulm: Lungs clear to auscultation, symmetric chest rise CV: regular rate and rhythm Abd: soft, nontender, nondistended. Upper midline diastasis. Soft reducible umbilical hernia, defect about 1.5cm cm Ext: no edema, no calf tenderness Skin: no rash, no jaundice    Assessment:     Umbilical hernia Morbid obesity     Plan:     We rediscussed the etiology and management of umbilical hernias. He is now Interested in putting off surgery until the end of October or November Since he just bought some houses and needs to refurbish those. We rediscussed surgery as well as the postoperative risk of intraoperative risk and complications. We rediscussed the typical postoperative care with respect to no heavy lifting for 4-6 weeks. We discussed the importance of ongoing efforts for weight loss to decrease his risk of hernia recurrence. He also rediscussed the signs and symptoms of incarceration and strangulation. I explained to the patient that I'm out of town the last week of October and the first week in  November. He would like to have the surgery on Friday in November. I told him our office would contact him once our November operative calendar is open  Mary Sella. Andrey Campanile, MD, FACS General, Bariatric, & Minimally Invasive Surgery [facility], [place]

## 2013-01-31 ENCOUNTER — Encounter: Payer: Self-pay | Admitting: Interventional Cardiology

## 2013-01-31 ENCOUNTER — Ambulatory Visit (INDEPENDENT_AMBULATORY_CARE_PROVIDER_SITE_OTHER): Payer: 59 | Admitting: Interventional Cardiology

## 2013-01-31 VITALS — BP 124/84 | HR 53 | Ht 75.0 in | Wt 325.0 lb

## 2013-01-31 DIAGNOSIS — Z8679 Personal history of other diseases of the circulatory system: Secondary | ICD-10-CM

## 2013-01-31 DIAGNOSIS — Z9889 Other specified postprocedural states: Secondary | ICD-10-CM

## 2013-01-31 DIAGNOSIS — I10 Essential (primary) hypertension: Secondary | ICD-10-CM

## 2013-01-31 DIAGNOSIS — I2510 Atherosclerotic heart disease of native coronary artery without angina pectoris: Secondary | ICD-10-CM

## 2013-01-31 DIAGNOSIS — E785 Hyperlipidemia, unspecified: Secondary | ICD-10-CM

## 2013-01-31 NOTE — Patient Instructions (Signed)
Your physician recommends that you continue on your current medications as directed. Please refer to the Current Medication list given to you today.  Your physician wants you to follow-up in: 1 year. You will receive a reminder letter in the mail two months in advance. If you don't receive a letter, please call our office to schedule the follow-up appointment.  

## 2013-01-31 NOTE — Progress Notes (Signed)
[address] [place], [place]  [address] Phone: [phone] Fax:  [phone]  Date:  01/31/2013   ID:  [name], DOB 12/30/1955, MRN [id number]  PCP:  Thora Lance, MD   ASSESSMENT:  1. Status post descending aortic aneurysm resection with valve sparing technique. Asymptomatic 2. Coronary artery bypass grafting at time of aortic surgery. LIMA to LAD and saphenous vein graft to PDA 3. Hypertension now well controlled 4. Hyperlipidemia on therapy  PLAN:  1. I recommended aerobic activity and weight loss. I encouraged diet. 2. Clinical followup in one year 3. Call if irregular heart rhythm or cardiopulmonary symptoms.   SUBJECTIVE: Jacob Small is a 57 y.o. male who is doing well. He has had no recurrence of atrial fibrillation after discontinuation of antiarrhythmic therapy. He denies angina and heart failure complaints. Is no peripheral edema or other cardiac symptoms. Is no peripheral edema or other complaints.   Wt Readings from Last 3 Encounters:  01/31/13 325 lb (147.419 kg)  10/04/12 323 lb 3.2 oz (146.603 kg)  07/20/12 325 lb (147.419 kg)     Past Medical History  Diagnosis Date  . HTN (hypertension)   . Bilateral hearing loss   . CAD (coronary artery disease)   . Hyperlipidemia   . CAD (coronary artery disease), native coronary artery     Aortic Root Replacement and CABGx2 on 09/22/2010  Dr. Cornelius Moras  . Obesity, morbid   . Aortic aneurysm, thoracic   . S/P CABG x 2 09/22/2010    LIMA to LAD, SVG to PDA  . S/P thoracic aortic aneurysm repair 09/22/2010    Valve-sparing aortic root replacement (David Type I)  . Myocardial infarction     Current Outpatient Prescriptions  Medication Sig Dispense Refill  . amLODipine (NORVASC) 5 MG tablet Take 5 mg by mouth daily.      Marland Kitchen aspirin 325 MG tablet Take 325 mg by mouth daily.      . carvedilol (COREG) 12.5 MG tablet Take 12.5 mg by mouth 2 (two) times daily with a meal.       .  losartan-hydrochlorothiazide (HYZAAR) 100-25 MG per tablet       . rosuvastatin (CRESTOR) 10 MG tablet Take 5 mg by mouth daily. Only on mondays, wednesdays, fridays      . [DISCONTINUED] furosemide (LASIX) 40 MG tablet Take 40 mg by mouth daily.       . [DISCONTINUED] KLOR-CON 10 10 MEQ CR tablet Take 10 mEq by mouth daily.       . [DISCONTINUED] telmisartan (MICARDIS) 40 MG tablet Take 40 mg by mouth daily.       . [DISCONTINUED] XARELTO 20 MG TABS Take 1 capsule by mouth once.        No current facility-administered medications for this visit.    Allergies:    Allergies  Allergen Reactions  . Lisinopril Cough  . Toprol Xl [Metoprolol Succinate] Other (See Comments)    FATIGUE    Social History:  The patient  reports that he quit smoking about 17 years ago. He does not have any smokeless tobacco history on file. He reports that he drinks alcohol. He reports that he does not use illicit drugs.   ROS:  Please see the history of present illness   All other systems reviewed and negative.   OBJECTIVE: VS:  BP 124/84  Pulse 53  Ht 6\' 3"  (1.905 m)  Wt 325 lb (147.419 kg)  BMI 40.62  kg/m2 Well nourished, well developed, in no acute distress, obese HEENT: normal Neck: JVD flat. Carotid bruit absent  Cardiac:  normal S1, S2; RRR;   soft 1-2/6 systolic murmur at the right upper sternal border  Lungs:  clear to auscultation bilaterally, no wheezing, rhonchi or rales Abd: soft, nontender, no hepatomegaly Ext: Edema  Absent . Pulses 2+  Skin: warm and dry Neuro:  CNs 2-12 intact, no focal abnormalities noted  EKG:  Sinus bradycardia with small inferior Q waves. Unchanged from prior.       Signed, Darci Needle III, MD 01/31/2013 10:49 AM  Past Medical History  Hypertension (plus white coat component)   Coronary artery disease (s/p inferolateral MI and PTCA in 1997). Abn Nuc 5/12. No significant ischemia   Elevated triglycerides/ Low HDL   Hearing Loss (has hearing aids)    Obesity   CABG and Aortic root replacement 09/22/10. Valve sparring and LIMA to LAD and SVG to PDA   Atrial fibrillation post op open heart surgery   Adenomatous colon polyp (colon 04/2008; none on colon 08/2011)   Diverticulosis

## 2013-02-23 ENCOUNTER — Other Ambulatory Visit: Payer: Self-pay

## 2013-02-23 MED ORDER — ROSUVASTATIN CALCIUM 10 MG PO TABS: 5.0000 mg | ORAL_TABLET | Freq: Every day | ORAL | Status: DC

## 2013-02-26 ENCOUNTER — Other Ambulatory Visit: Payer: Self-pay | Admitting: *Deleted

## 2013-02-26 MED ORDER — ROSUVASTATIN CALCIUM 5 MG PO TABS: 5.0000 mg | ORAL_TABLET | Freq: Every day | ORAL | Status: DC

## 2013-03-05 ENCOUNTER — Other Ambulatory Visit: Payer: Self-pay

## 2013-03-05 MED ORDER — AMLODIPINE BESYLATE 5 MG PO TABS: 5.0000 mg | ORAL_TABLET | Freq: Every day | ORAL | Status: DC

## 2013-04-24 IMAGING — CR DG CHEST 1V PORT
1 series · 1 of 1 positions shown · non-contrast
Comparison: 09/23/2010.

CLINICAL DATA: CABG.  Aortic valve replacement.

PORTABLE CHEST - 1 VIEW

[AP]
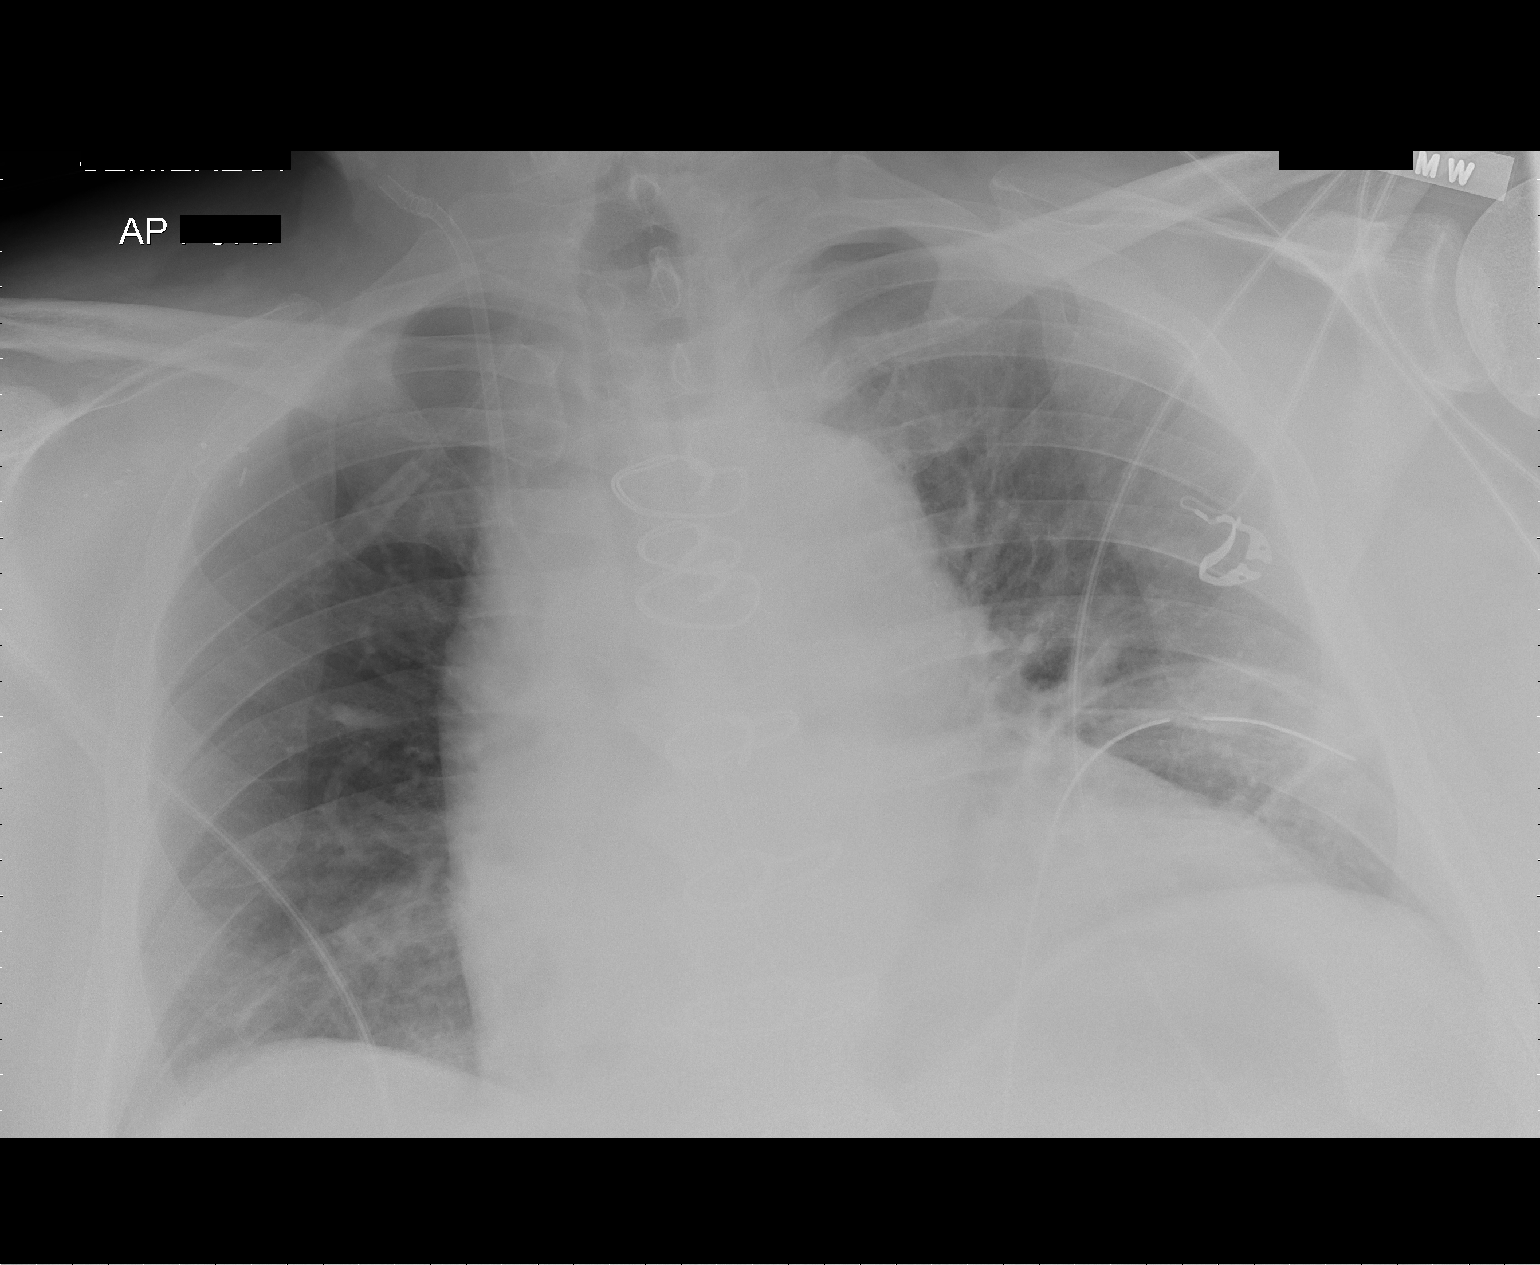

[1 of 1 positions shown; findings below may reference images not displayed]

FINDINGS: Cardiomegaly.  Low lung volumes.  The Swan-Ganz catheter
has been removed.  Right IJ vascular sheath persists.  Bilateral
basilar atelectasis.  No edema.  Left thoracostomy tube remains
present with associated pleural reaction. Small left apical
pneumothorax persists, unchanged in size compared to prior
examination. Right axillary clips.
IMPRESSION: Stable left apical pneumothorax.  Cardiomegaly and postoperative
changes of CABG.  Interval removal of Swan-Ganz catheter.

## 2013-06-23 IMAGING — CR DG CHEST 2V
2 series · 2 of 2 positions shown · non-contrast
Comparison: 10/16/2010; 09/25/2010; 09/23/2010; chest CT -
06/16/2010

CLINICAL DATA: History of heart surgery and thoracic aneurysm

CHEST - 2 VIEW

[w chest pa]
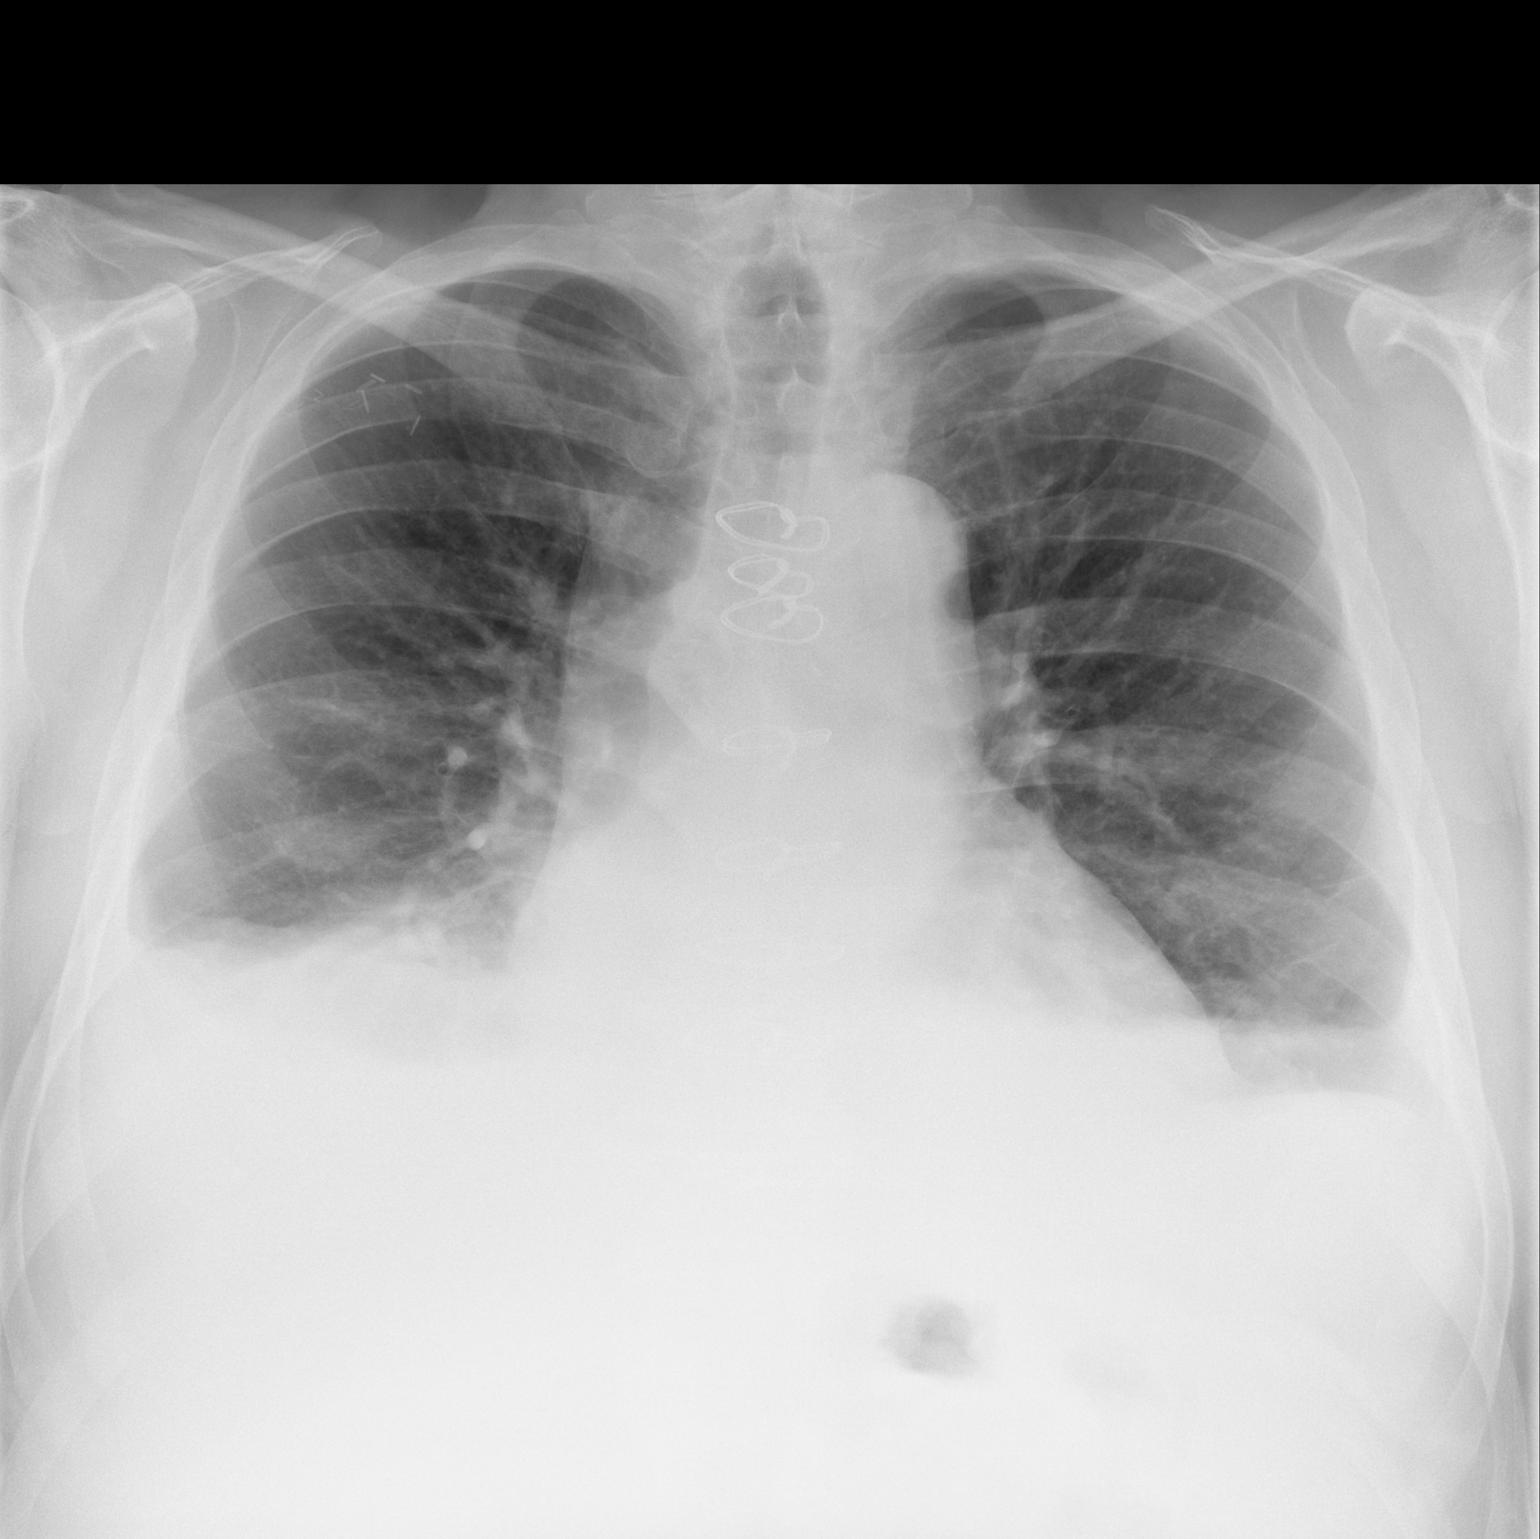

[w chest lat]
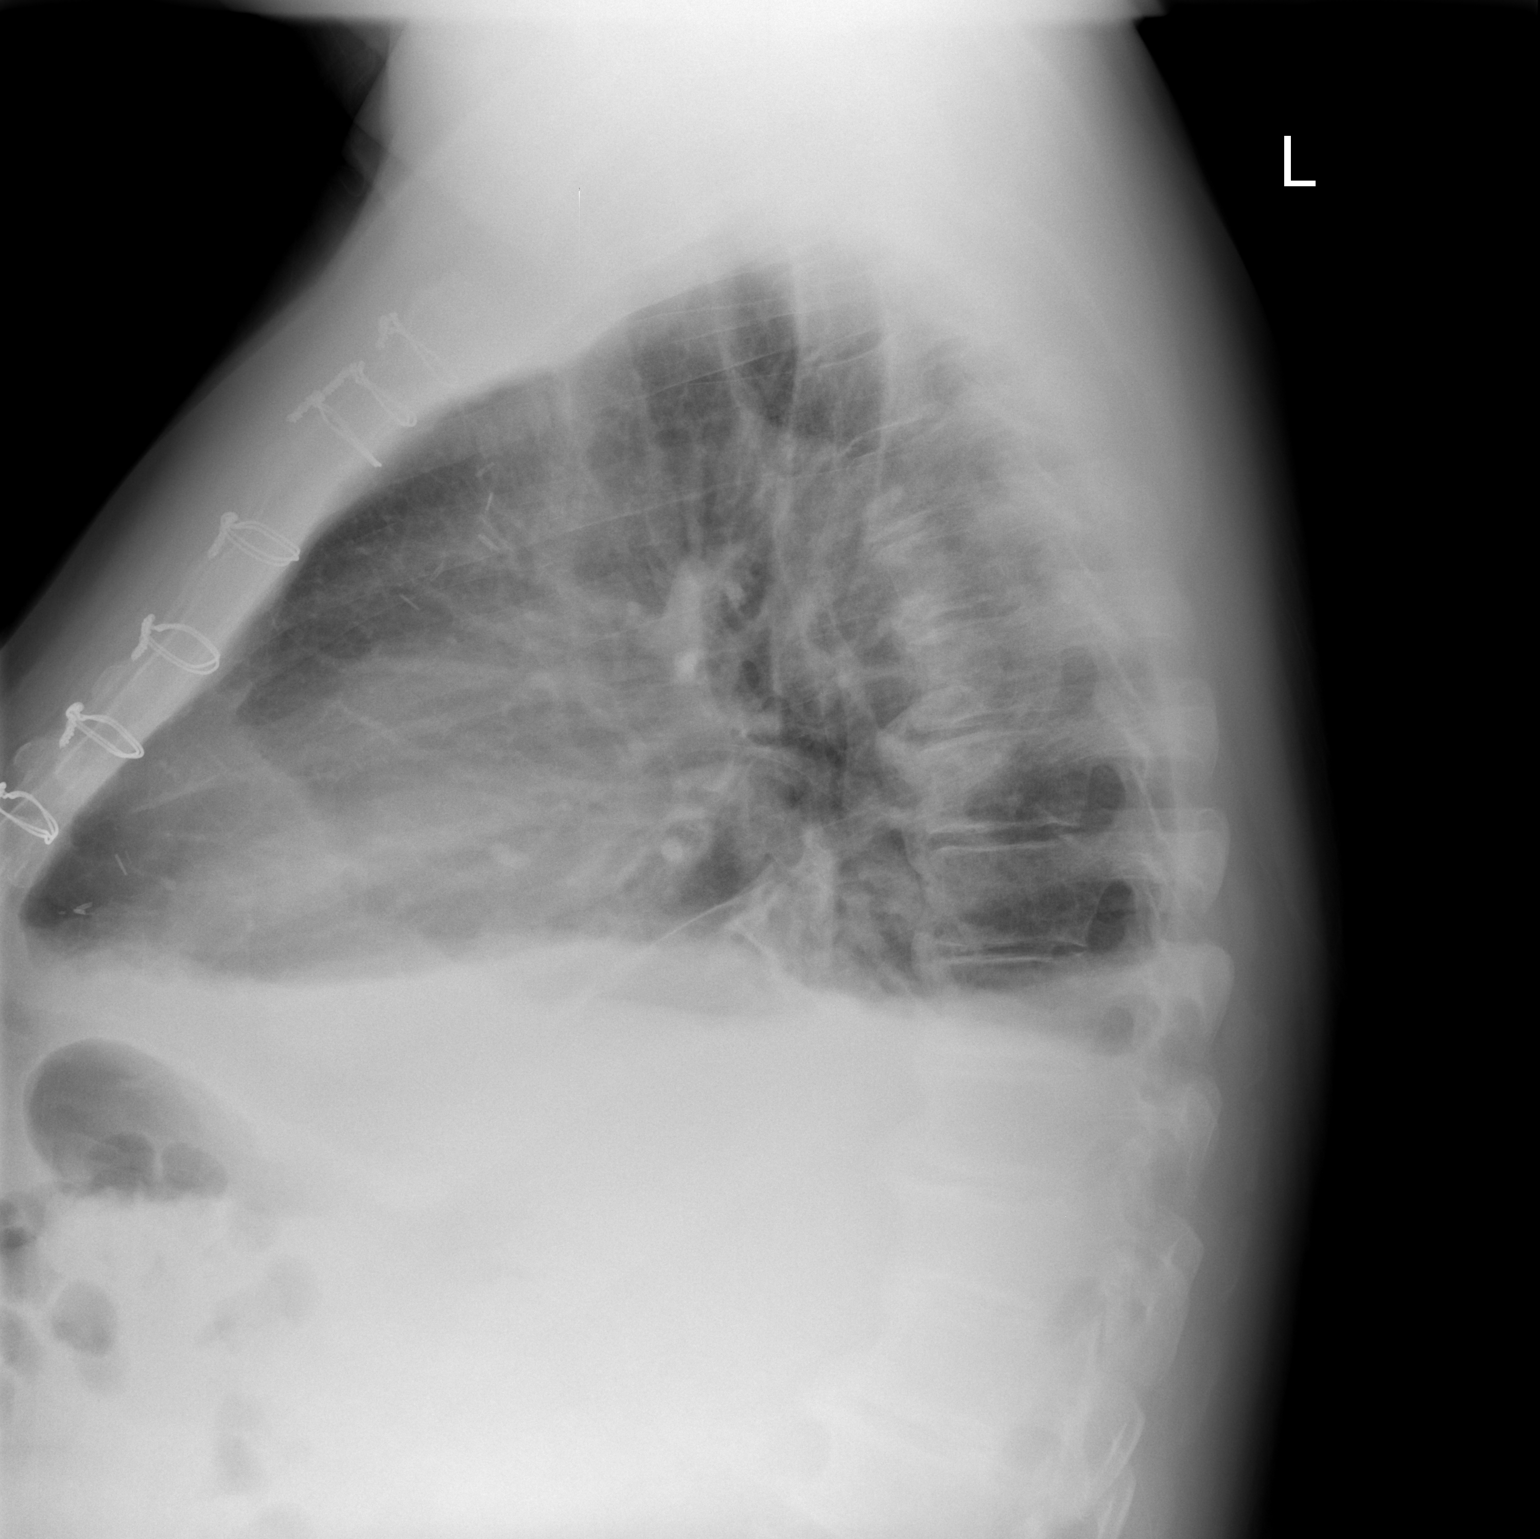

[2 of 2 positions shown; findings below may reference images not displayed]

FINDINGS: Grossly unchanged enlarged cardiac silhouette and
mediastinal contours post median sternotomy and CABG.  Interval
increase in small to moderate sized bilateral effusions and
bibasilar heterogeneous / consolidative opacities.  Right superior
chest wall surgical clips.  Grossly unchanged bones.
IMPRESSION: Interval increase in small to moderate sized bilateral effusions
and bibasilar heterogeneous / consolidative opacities, possibly
atelectasis.

## 2013-08-15 ENCOUNTER — Other Ambulatory Visit: Payer: Self-pay

## 2013-08-15 MED ORDER — ROSUVASTATIN CALCIUM 5 MG PO TABS: 5.0000 mg | ORAL_TABLET | Freq: Every day | ORAL | Status: DC

## 2013-08-15 MED ORDER — CARVEDILOL 12.5 MG PO TABS: 12.5000 mg | ORAL_TABLET | Freq: Two times a day (BID) | ORAL | Status: DC

## 2013-08-15 MED ORDER — LOSARTAN POTASSIUM-HCTZ 100-25 MG PO TABS: 1.0000 | ORAL_TABLET | Freq: Every day | ORAL | Status: DC

## 2013-10-08 ENCOUNTER — Encounter: Payer: Self-pay | Admitting: Interventional Cardiology

## 2014-01-30 ENCOUNTER — Ambulatory Visit: Payer: 59 | Admitting: Interventional Cardiology

## 2014-02-27 ENCOUNTER — Encounter: Payer: Self-pay | Admitting: Interventional Cardiology

## 2014-02-27 ENCOUNTER — Ambulatory Visit (INDEPENDENT_AMBULATORY_CARE_PROVIDER_SITE_OTHER): Payer: 59 | Admitting: Interventional Cardiology

## 2014-02-27 VITALS — BP 122/82 | HR 46 | Ht 75.0 in | Wt 317.0 lb

## 2014-02-27 DIAGNOSIS — I2510 Atherosclerotic heart disease of native coronary artery without angina pectoris: Secondary | ICD-10-CM

## 2014-02-27 DIAGNOSIS — Z951 Presence of aortocoronary bypass graft: Secondary | ICD-10-CM

## 2014-02-27 DIAGNOSIS — R001 Bradycardia, unspecified: Secondary | ICD-10-CM

## 2014-02-27 DIAGNOSIS — Z9889 Other specified postprocedural states: Secondary | ICD-10-CM

## 2014-02-27 DIAGNOSIS — I10 Essential (primary) hypertension: Secondary | ICD-10-CM

## 2014-02-27 DIAGNOSIS — Z8679 Personal history of other diseases of the circulatory system: Secondary | ICD-10-CM

## 2014-02-27 MED ORDER — ROSUVASTATIN CALCIUM 5 MG PO TABS: 5.0000 mg | ORAL_TABLET | Freq: Every day | ORAL | Status: DC

## 2014-02-27 MED ORDER — CARVEDILOL 6.25 MG PO TABS: 6.2500 mg | ORAL_TABLET | Freq: Two times a day (BID) | ORAL | Status: DC

## 2014-02-27 MED ORDER — LOSARTAN POTASSIUM-HCTZ 100-25 MG PO TABS: 1.0000 | ORAL_TABLET | Freq: Every day | ORAL | Status: DC

## 2014-02-27 MED ORDER — AMLODIPINE BESYLATE 5 MG PO TABS: 5.0000 mg | ORAL_TABLET | Freq: Every day | ORAL | Status: DC

## 2014-02-27 MED ORDER — CARVEDILOL 12.5 MG PO TABS: 6.2500 mg | ORAL_TABLET | Freq: Two times a day (BID) | ORAL | Status: DC

## 2014-02-27 NOTE — Patient Instructions (Signed)
Your physician has recommended you make the following change in your medication:  1) DECREASE Carvedilol to 6.25mg  twice daily  Take all other medications as prescribed  Monitor your blood pressure at home. Call the office if is consistently elevated, above 140/90  Your physician wants you to follow-up in: 1 year with Dr.Smith You will receive a reminder letter in the mail two months in advance. If you don't receive a letter, please call our office to schedule the follow-up appointment.

## 2014-02-27 NOTE — Progress Notes (Signed)
Patient ID: LAVONTAY KIRK, male   DOB: 27-Dec-1955, 59 y.o.   MRN: [id number]    [address] [place], [place]  [address] Phone: [phone] Fax:  [phone]  Date:  02/27/2014   ID:  [name], DOB 1955/03/27, MRN [id number]  PCP:  Simona Huh, MD   ASSESSMENT:  1. Marked bradycardia 2. CAD with previous bypass, asymptomatic 3. Ascending aortic aneurysm, status post repair 4. Obesity  PLAN:  1. Decrease carvedilol to 6.125 mg twice a day 2. Monitor blood pressure at least weekly and call if blood pressures began running at 140/90 or greater 3. Weight loss 4. One-year follow-up   SUBJECTIVE: Jacob Small is a 59 y.o. male who has no complaints. He wonders if he needs to lose weight. His been instructed to do so by Dr. Marisue Humble. He denies chest pain. He has not had palpitations or syncope. He denies headache dyspnea peripheral edema. No medication side effects.   Wt Readings from Last 3 Encounters:  02/27/14 317 lb (143.79 kg)  01/31/13 325 lb (147.419 kg)  10/04/12 323 lb 3.2 oz (146.603 kg)     Past Medical History  Diagnosis Date  . HTN (hypertension)   . Bilateral hearing loss   . CAD (coronary artery disease)   . Hyperlipidemia   . CAD (coronary artery disease), native coronary artery     Aortic Root Replacement and CABGx2 on 09/22/2010  Dr. Roxy Manns  . Obesity, morbid   . Aortic aneurysm, thoracic   . S/P CABG x 2 09/22/2010    LIMA to LAD, SVG to PDA  . S/P thoracic aortic aneurysm repair 09/22/2010    Valve-sparing aortic root replacement (David Type I)  . Myocardial infarction     Current Outpatient Prescriptions  Medication Sig Dispense Refill  . amLODipine (NORVASC) 5 MG tablet Take 1 tablet (5 mg total) by mouth daily. 90 tablet 3  . aspirin 325 MG tablet Take 325 mg by mouth daily.    . carvedilol (COREG) 12.5 MG tablet Take 1 tablet (12.5 mg total) by mouth 2 (two) times daily with a meal. 60 tablet 6  .  losartan-hydrochlorothiazide (HYZAAR) 100-25 MG per tablet Take 1 tablet by mouth daily. 30 tablet 6  . rosuvastatin (CRESTOR) 5 MG tablet Take 1 tablet (5 mg total) by mouth daily. 30 tablet 6  . [DISCONTINUED] furosemide (LASIX) 40 MG tablet Take 40 mg by mouth daily.     . [DISCONTINUED] KLOR-CON 10 10 MEQ CR tablet Take 10 mEq by mouth daily.     . [DISCONTINUED] telmisartan (MICARDIS) 40 MG tablet Take 40 mg by mouth daily.     . [DISCONTINUED] XARELTO 20 MG TABS Take 1 capsule by mouth once.      No current facility-administered medications for this visit.    Allergies:    Allergies  Allergen Reactions  . Lisinopril Cough  . Toprol Xl [Metoprolol Succinate] Other (See Comments)    FATIGUE    Social History:  The patient  reports that he quit smoking about 19 years ago. He does not have any smokeless tobacco history on file. He reports that he drinks alcohol. He reports that he does not use illicit drugs.   ROS:  Please see the history of present illness.   Denies neurological complaints. Appetite is been stable.   All other systems reviewed and negative.   OBJECTIVE: VS:  BP 122/82 mmHg  Pulse 46  Ht 6\' 3"  (1.905 m)  Wt 317 lb (143.79 kg)  BMI 39.62 kg/m2 Well nourished, well developed, in no acute distress, morbid obesity flat HEENT: normal Neck: JVD flat. Carotid bruit absent  Cardiac:  normal S1, S2; RRR; 2/6 systolic murmur right upper sternal border murmur Lungs:  clear to auscultation bilaterally, no wheezing, rhonchi or rales Abd: soft, nontender, no hepatomegaly Ext: Edema absent. Pulses 2+ Skin: warm and dry Neuro:  CNs 2-12 intact, no focal abnormalities noted  EKG:  Marked sinus bradycardia at 46 bpm. Old inferior Q waves. Otherwise unremarkable       Signed, Illene Labrador III, MD 02/27/2014 9:50 AM

## 2014-04-15 ENCOUNTER — Encounter: Payer: Self-pay | Admitting: Interventional Cardiology

## 2014-09-12 ENCOUNTER — Other Ambulatory Visit: Payer: Self-pay | Admitting: Interventional Cardiology

## 2014-10-26 ENCOUNTER — Other Ambulatory Visit: Payer: Self-pay | Admitting: Interventional Cardiology

## 2014-12-18 ENCOUNTER — Encounter: Payer: Self-pay | Admitting: Interventional Cardiology

## 2015-03-11 ENCOUNTER — Other Ambulatory Visit: Payer: Self-pay | Admitting: *Deleted

## 2015-03-11 MED ORDER — AMLODIPINE BESYLATE 5 MG PO TABS: 5.0000 mg | ORAL_TABLET | Freq: Every day | ORAL | Status: DC

## 2015-03-11 MED ORDER — LOSARTAN POTASSIUM-HCTZ 100-25 MG PO TABS: 1.0000 | ORAL_TABLET | Freq: Every day | ORAL | Status: DC

## 2015-03-11 MED ORDER — ROSUVASTATIN CALCIUM 5 MG PO TABS: 5.0000 mg | ORAL_TABLET | ORAL | Status: DC

## 2015-04-04 ENCOUNTER — Ambulatory Visit (INDEPENDENT_AMBULATORY_CARE_PROVIDER_SITE_OTHER): Payer: 59 | Admitting: Interventional Cardiology

## 2015-04-04 ENCOUNTER — Encounter: Payer: Self-pay | Admitting: Interventional Cardiology

## 2015-04-04 VITALS — BP 150/92 | HR 52 | Ht 75.0 in | Wt 326.4 lb

## 2015-04-04 DIAGNOSIS — I10 Essential (primary) hypertension: Secondary | ICD-10-CM | POA: Diagnosis not present

## 2015-04-04 DIAGNOSIS — I2510 Atherosclerotic heart disease of native coronary artery without angina pectoris: Secondary | ICD-10-CM

## 2015-04-04 DIAGNOSIS — R001 Bradycardia, unspecified: Secondary | ICD-10-CM

## 2015-04-04 DIAGNOSIS — Z8679 Personal history of other diseases of the circulatory system: Secondary | ICD-10-CM

## 2015-04-04 DIAGNOSIS — Z9889 Other specified postprocedural states: Secondary | ICD-10-CM | POA: Diagnosis not present

## 2015-04-04 DIAGNOSIS — Z951 Presence of aortocoronary bypass graft: Secondary | ICD-10-CM | POA: Diagnosis not present

## 2015-04-04 MED ORDER — ROSUVASTATIN CALCIUM 5 MG PO TABS: 5.0000 mg | ORAL_TABLET | ORAL | Status: DC

## 2015-04-04 MED ORDER — LOSARTAN POTASSIUM-HCTZ 100-25 MG PO TABS: 1.0000 | ORAL_TABLET | Freq: Every day | ORAL | Status: DC

## 2015-04-04 MED ORDER — AMLODIPINE BESYLATE 10 MG PO TABS: 10.0000 mg | ORAL_TABLET | Freq: Every day | ORAL | Status: DC

## 2015-04-04 MED ORDER — ASPIRIN EC 81 MG PO TBEC: 81.0000 mg | DELAYED_RELEASE_TABLET | Freq: Every day | ORAL | Status: DC

## 2015-04-04 MED ORDER — CARVEDILOL 6.25 MG PO TABS: 6.2500 mg | ORAL_TABLET | Freq: Two times a day (BID) | ORAL | Status: DC

## 2015-04-04 NOTE — Patient Instructions (Signed)
Medication Instructions:  Your physician has recommended you make the following change in your medication:  1-Increase Norvasc to 10 mg by mouth daily 2-Decrease Aspirin to 81 mg by mouth daily  Labwork: NONE  Testing/Procedures: NONE  Follow-Up: Your physician wants you to follow-up in: 1 year with Dr. Tamala Julian. You will receive a reminder letter in the mail two months in advance. If you don't receive a letter, please call our office to schedule the follow-up appointment.   If you need a refill on your cardiac medications before your next appointment, please call your pharmacy.

## 2015-04-04 NOTE — Progress Notes (Signed)
Cardiology Office Note   Date:  04/04/2015   ID:  Jacob Small, DOB 12/05/1955, MRN [id number]  PCP:  Simona Huh, MD  Cardiologist:  Sinclair Grooms, MD   Chief Complaint  Patient presents with  . Coronary Artery Disease      History of Present Illness: Jacob Small is a 60 y.o. male who presents for  Bicuspid aortic valve, ascending aortic aneurysm, status post aneurysm repair and valve sparing aortic root replacement , hypertension, and hyperlipidemia.   Doing well. Denies chest discomfort. No medication side effects. Has not had syncope or dizziness. Still notices bradycardia when he checks his blood pressure. No episodes of syncope or near syncope.    Past Medical History  Diagnosis Date  . HTN (hypertension)   . Bilateral hearing loss   . CAD (coronary artery disease)   . Hyperlipidemia   . CAD (coronary artery disease), native coronary artery     Aortic Root Replacement and CABGx2 on 09/22/2010  Dr. Roxy Manns  . Obesity, morbid ([place])   . Aortic aneurysm, thoracic ([place])   . S/P CABG x 2 09/22/2010    LIMA to LAD, SVG to PDA  . S/P thoracic aortic aneurysm repair 09/22/2010    Valve-sparing aortic root replacement (David Type I)  . Myocardial infarction [facility])     Past Surgical History  Procedure Laterality Date  . Prostate surgery  09/30/10    BX  2010 NEG  . Pilonidal cyst excision    . Knee debridement      02/2008 CHONDROPLASTY/DEB. OF MENISCUS  . Aortic root replacement  09/22/2010    Median sternotomy for valve-sparing aortic root replacemnt Shanon Brow I reimplanation technique) with resection of engrafting of the ascending thoracic aorta .Partial circulatoru arrest, coronaru aartery bypass grafting X2 (left intrnal mammary artery to distal left anterior descending coronary artery, saphenous vein graft to posterrior descending coronary artery, EVH rt thigh  . Coronary artery bypass graft  09/22/2010    CABGx2 with LIMA to LAD, SVG to PDA  . Hand surgery      . External ear surgery       Current Outpatient Prescriptions  Medication Sig Dispense Refill  . amLODipine (NORVASC) 5 MG tablet Take 1 tablet (5 mg total) by mouth daily. 30 tablet 0  . aspirin 325 MG tablet Take 325 mg by mouth daily.    . carvedilol (COREG) 6.25 MG tablet Take 1 tablet (6.25 mg total) by mouth 2 (two) times daily with a meal. 60 tablet 11  . losartan-hydrochlorothiazide (HYZAAR) 100-25 MG tablet Take 1 tablet by mouth daily. 30 tablet 0  . rosuvastatin (CRESTOR) 5 MG tablet Take 1 tablet (5 mg total) by mouth 3 (three) times a week. 15 tablet 0  . [DISCONTINUED] furosemide (LASIX) 40 MG tablet Take 40 mg by mouth daily.     . [DISCONTINUED] KLOR-CON 10 10 MEQ CR tablet Take 10 mEq by mouth daily.     . [DISCONTINUED] telmisartan (MICARDIS) 40 MG tablet Take 40 mg by mouth daily.     . [DISCONTINUED] XARELTO 20 MG TABS Take 1 capsule by mouth once.      No current facility-administered medications for this visit.    Allergies:   Lisinopril and Toprol xl    Social History:  The patient  reports that he quit smoking about 20 years ago. He has quit using smokeless tobacco. He reports that he drinks alcohol. He reports that he does not use illicit drugs.  Family History:  The patient's family history includes Cancer in his brother; Heart disease in his father.    ROS:  Please see the history of present illness.   Otherwise, review of systems are positive for  Increasing weight an increasing appetite.   All other systems are reviewed and negative.    PHYSICAL EXAM: VS:  BP 150/92 mmHg  Pulse 52  Ht 6\' 3"  (1.905 m)  Wt 326 lb 6.4 oz (148.054 kg)  BMI 40.80 kg/m2 , BMI Body mass index is 40.8 kg/(m^2). GEN: Well nourished, well developed, in no acute distress HEENT: normal Neck: no JVD, carotid bruits, or masses Cardiac: RRR.  There is no murmur, rub, or gallop. There is no edema. Respiratory:  clear to auscultation bilaterally, normal work of breathing. GI:  soft, nontender, nondistended, + BS MS: no deformity or atrophy Skin: warm and dry, no rash Neuro:  Strength and sensation are intact Psych: euthymic mood, full affect   EKG:  EKG is ordered today. The ekg reveals  Sinus bradycardia an otherwise normal   Recent Labs: No results found for requested labs within last 365 days.    Lipid Panel No results found for: CHOL, TRIG, HDL, CHOLHDL, VLDL, LDLCALC, LDLDIRECT    Wt Readings from Last 3 Encounters:  04/04/15 326 lb 6.4 oz (148.054 kg)  02/27/14 317 lb (143.79 kg)  01/31/13 325 lb (147.419 kg)      Other studies Reviewed: Additional studies/ records that were reviewed today include:  Reviewed (data from [place]. The findings include  Normal kidney function. Bilirubin was 1.7. Lipids were excellent..    ASSESSMENT AND PLAN:  1. Coronary artery disease involving native coronary artery of native heart without angina pectoris  no angina  2. Essential hypertension  poor control  3. S/P thoracic aortic aneurysm repair  asymptomatic  4. S/P CABG x 2  as above asymptomatic  5. Bradycardia on ECG  persistent but improved    Current medicines are reviewed at length with the patient today.  The patient has the following concerns regarding medicines:  No side effects.  The following changes/actions have been instituted:      Increase amlodipine to 10 mg daily   Low-salt diet   Aerobic exercising calorie reduction for weight control   Decrease aspirin 81 mg daily   Labs/ tests ordered today include:  No orders of the defined types were placed in this encounter.     Disposition:   FU with HS in 1 year  Signed, Sinclair Grooms, MD  04/04/2015 8:38 AM    [address], [place], [place]  [address] Phone: [phone]; Fax: [phone]

## 2015-05-12 ENCOUNTER — Telehealth: Payer: Self-pay | Admitting: Interventional Cardiology

## 2015-05-12 NOTE — Telephone Encounter (Signed)
Returned pt call. Pt sts that he has had increased puffiness in his ankles since his Amlodipine was increased to 10mg  daily. Pt denies any other symptoms.pt sts that he does ck his bp regularly, he is unable to report his exact bp readings, but sts that it is normally in the 130-140's/70 and his heart rate is normally in the 50's. Adv pt that Dr.Smith is out of the office. I will fwd him an update and call back with his recommendation. Pt agreeable with plan and verbalized understanding.

## 2015-05-12 NOTE — Telephone Encounter (Signed)
Pt calling re both ankles are puffy-was told to call after med change in Feb if any problems -pls advise [phone]

## 2015-05-15 NOTE — Telephone Encounter (Signed)
The swelling is from amlodipine. It is not related to any problem with his heart. If it is causing symptoms we will have to switch to something else. If he can put up with it, I would continue the medication.

## 2015-05-19 NOTE — Telephone Encounter (Signed)
Follow up  Pt followed up on previous message. Pt requests a call back today. He states that he has waited a week for a response. Pt states that he is still Swollen.  Pt c/o swelling: STAT is pt has developed SOB within 24 hours  1. How long have you been experiencing swelling? 1-2 weeks   2. Where is the swelling located? Ankles   3.  Are you currently taking a "fluid pill"? Yes   4.  Are you currently SOB? No   5.  Have you traveled recently? No

## 2015-05-19 NOTE — Telephone Encounter (Signed)
Returned pt call. called to give pt Dr.Smith's response below

## 2015-05-19 NOTE — Telephone Encounter (Signed)
Pt is aware of Dr.Smith's response below. Pt sts that he is getting good BP control with the increased dosage of Amlodipine 10mg  po qd, but has not been able to wear his boots without discomfort, they are hard to get on and off and feel like they are cutting his circulation off at times. 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 The swelling did not occur on the lower dose of Amlodipine. Pt sts that he usually checks his bp regularly and his readings are ok. He thinks it was elevated at his last o/v because of white coat. Adv pt that I will fwd his concern to Dr.Smith and call back with his response. Pt verbalized understanding

## 2015-05-19 NOTE — Telephone Encounter (Signed)
Pt aware of  

## 2015-05-21 NOTE — Telephone Encounter (Signed)
Decrease amlodipine back to 5 mg per day. Monitor blood pressure. Notify me if it is running consistently above 140/90 mmHg

## 2015-05-22 MED ORDER — AMLODIPINE BESYLATE 5 MG PO TABS: 5.0000 mg | ORAL_TABLET | Freq: Every day | ORAL | Status: DC

## 2015-05-22 NOTE — Telephone Encounter (Signed)
Pt aware of Dr.Smith's response Decrease amlodipine back to 5 mg per day. Monitor blood pressure. Notify me if it is running consistently above 140/90 mmHg Pt agreeable with plan. He will cut his 10mg  tabs in 1/2 and complete his supple. Rx sent to pt pharmacy for Amlodipine 5mg  po qd. Pt verbalized understanding.

## 2015-09-16 ENCOUNTER — Encounter: Payer: Self-pay | Admitting: Interventional Cardiology

## 2016-03-19 DIAGNOSIS — I2510 Atherosclerotic heart disease of native coronary artery without angina pectoris: Secondary | ICD-10-CM | POA: Diagnosis not present

## 2016-03-19 DIAGNOSIS — E781 Pure hyperglyceridemia: Secondary | ICD-10-CM | POA: Diagnosis not present

## 2016-03-19 DIAGNOSIS — I10 Essential (primary) hypertension: Secondary | ICD-10-CM | POA: Diagnosis not present

## 2016-03-19 DIAGNOSIS — Z125 Encounter for screening for malignant neoplasm of prostate: Secondary | ICD-10-CM | POA: Diagnosis not present

## 2016-03-29 ENCOUNTER — Encounter: Payer: Self-pay | Admitting: Interventional Cardiology

## 2016-03-30 ENCOUNTER — Telehealth: Payer: Self-pay | Admitting: Interventional Cardiology

## 2016-03-30 DIAGNOSIS — N401 Enlarged prostate with lower urinary tract symptoms: Secondary | ICD-10-CM | POA: Diagnosis not present

## 2016-03-30 DIAGNOSIS — R350 Frequency of micturition: Secondary | ICD-10-CM | POA: Diagnosis not present

## 2016-03-30 DIAGNOSIS — R3914 Feeling of incomplete bladder emptying: Secondary | ICD-10-CM | POA: Diagnosis not present

## 2016-03-30 NOTE — Telephone Encounter (Signed)
Informed pt of lab results. Pt verbalized understanding. 

## 2016-03-30 NOTE — Telephone Encounter (Signed)
New Message ° ° °Returning your call please call back! °

## 2016-04-01 DIAGNOSIS — N401 Enlarged prostate with lower urinary tract symptoms: Secondary | ICD-10-CM | POA: Diagnosis not present

## 2016-04-01 DIAGNOSIS — N433 Hydrocele, unspecified: Secondary | ICD-10-CM | POA: Diagnosis not present

## 2016-04-01 DIAGNOSIS — R972 Elevated prostate specific antigen [PSA]: Secondary | ICD-10-CM | POA: Diagnosis not present

## 2016-04-06 ENCOUNTER — Ambulatory Visit (INDEPENDENT_AMBULATORY_CARE_PROVIDER_SITE_OTHER): Payer: 59 | Admitting: Interventional Cardiology

## 2016-04-06 ENCOUNTER — Encounter (INDEPENDENT_AMBULATORY_CARE_PROVIDER_SITE_OTHER): Payer: Self-pay

## 2016-04-06 ENCOUNTER — Encounter: Payer: Self-pay | Admitting: Interventional Cardiology

## 2016-04-06 VITALS — BP 136/86 | HR 59 | Ht 75.0 in | Wt 326.0 lb

## 2016-04-06 DIAGNOSIS — I10 Essential (primary) hypertension: Secondary | ICD-10-CM | POA: Diagnosis not present

## 2016-04-06 DIAGNOSIS — E782 Mixed hyperlipidemia: Secondary | ICD-10-CM

## 2016-04-06 DIAGNOSIS — I2510 Atherosclerotic heart disease of native coronary artery without angina pectoris: Secondary | ICD-10-CM | POA: Diagnosis not present

## 2016-04-06 DIAGNOSIS — I712 Thoracic aortic aneurysm, without rupture, unspecified: Secondary | ICD-10-CM

## 2016-04-06 DIAGNOSIS — N5089 Other specified disorders of the male genital organs: Secondary | ICD-10-CM | POA: Diagnosis not present

## 2016-04-06 DIAGNOSIS — R011 Cardiac murmur, unspecified: Secondary | ICD-10-CM

## 2016-04-06 MED ORDER — ROSUVASTATIN CALCIUM 5 MG PO TABS: 5.0000 mg | ORAL_TABLET | ORAL | 11 refills | Status: DC

## 2016-04-06 MED ORDER — LOSARTAN POTASSIUM-HCTZ 100-25 MG PO TABS: 1.0000 | ORAL_TABLET | Freq: Every day | ORAL | 11 refills | Status: DC

## 2016-04-06 MED ORDER — AMLODIPINE BESYLATE 5 MG PO TABS: 5.0000 mg | ORAL_TABLET | Freq: Every day | ORAL | 11 refills | Status: DC

## 2016-04-06 MED ORDER — CARVEDILOL 6.25 MG PO TABS: 6.2500 mg | ORAL_TABLET | Freq: Two times a day (BID) | ORAL | 11 refills | Status: DC

## 2016-04-06 NOTE — Patient Instructions (Signed)
Medication Instructions:  None  Labwork: BNP and BMET today  Testing/Procedures: Your physician has requested that you have an echocardiogram. Echocardiography is a painless test that uses sound waves to create images of your heart. It provides your doctor with information about the size and shape of your heart and how well your heart's chambers and valves are working. This procedure takes approximately one hour. There are no restrictions for this procedure.    Follow-Up: Your physician wants you to follow-up in: 1 year with Dr. Tamala Julian.  You will receive a reminder letter in the mail two months in advance. If you don't receive a letter, please call our office to schedule the follow-up appointment.   Any Other Special Instructions Will Be Listed Below (If Applicable).     If you need a refill on your cardiac medications before your next appointment, please call your pharmacy.

## 2016-04-06 NOTE — Progress Notes (Signed)
Cardiology Office Note    Date:  04/06/2016   ID:  DAYMEN KOSAKOWSKI, DOB January 14, 1956, MRN [id number]  PCP:  Simona Huh, MD  Cardiologist: Sinclair Grooms, MD   Chief Complaint  Patient presents with  . Cardiac Valve Problem    History of Present Illness:  Jacob Small is a 61 y.o. male who presents for Bicuspid aortic valve, ascending aortic aneurysm, status post aneurysm repair and valve sparing aortic root replacement , hypertension, and hyperlipidemia.  He is concerned about scrotal edema that has been present for greater than a year. There has been some lower extremity swelling. He denies any dyspnea or or some pop and apnea. He denies chest pain. He is physically active. He has seen a Dealer and had the scrotal swelling drained. The urologist wonders if there was any connection to his heart.   Past Medical History:  Diagnosis Date  . Aortic aneurysm, thoracic ([place])   . Bilateral hearing loss   . CAD (coronary artery disease)   . CAD (coronary artery disease), native coronary artery    Aortic Root Replacement and CABGx2 on 09/22/2010  Dr. Roxy Manns  . HTN (hypertension)   . Hyperlipidemia   . Myocardial infarction   . Obesity, morbid ([place])   . S/P CABG x 2 09/22/2010   LIMA to LAD, SVG to PDA  . S/P thoracic aortic aneurysm repair 09/22/2010   Valve-sparing aortic root replacement (David Type I)    Past Surgical History:  Procedure Laterality Date  . AORTIC ROOT REPLACEMENT  09/22/2010   Median sternotomy for valve-sparing aortic root replacemnt Shanon Brow I reimplanation technique) with resection of engrafting of the ascending thoracic aorta .Partial circulatoru arrest, coronaru aartery bypass grafting X2 (left intrnal mammary artery to distal left anterior descending coronary artery, saphenous vein graft to posterrior descending coronary artery, EVH rt thigh  . CORONARY ARTERY BYPASS GRAFT  09/22/2010   CABGx2 with LIMA to LAD, SVG to PDA  . EXTERNAL EAR SURGERY    .  HAND SURGERY    . KNEE DEBRIDEMENT     02/2008 CHONDROPLASTY/DEB. OF MENISCUS  . PILONIDAL CYST EXCISION    . PROSTATE SURGERY  09/30/10   BX  2010 NEG    Current Medications: Outpatient Medications Prior to Visit  Medication Sig Dispense Refill  . aspirin EC 81 MG tablet Take 1 tablet (81 mg total) by mouth daily. 90 tablet 3  . amLODipine (NORVASC) 5 MG tablet Take 1 tablet (5 mg total) by mouth daily. 90 tablet 3  . carvedilol (COREG) 6.25 MG tablet Take 1 tablet (6.25 mg total) by mouth 2 (two) times daily with a meal. 60 tablet 11  . losartan-hydrochlorothiazide (HYZAAR) 100-25 MG tablet Take 1 tablet by mouth daily. 90 tablet 3  . rosuvastatin (CRESTOR) 5 MG tablet Take 1 tablet (5 mg total) by mouth 3 (three) times a week. 15 tablet 11   No facility-administered medications prior to visit.      Allergies:   Lisinopril and Toprol xl [metoprolol succinate]   Social History   Social History  . Marital status: Married    Spouse name: N/A  . Number of children: N/A  . Years of education: N/A   Social History Main Topics  . Smoking status: Former Smoker    Quit date: 02/16/1995  . Smokeless tobacco: Former Systems developer  . Alcohol use 0.0 oz/week  . Drug use: No  . Sexual activity: Not Asked   Other Topics Concern  .  None   Social History Narrative   Married ,lives in [place]     Family History:  The patient's family history includes Cancer in his brother; Heart disease in his father.   ROS:   Please see the history of present illness.    Slow urinary stream with dribbling. Scrotal swelling. Prostate enlargement. Otherwise no complaints.  All other systems reviewed and are negative.   PHYSICAL EXAM:   VS:  BP 136/86   Pulse (!) 59   Ht 6\' 3"  (1.905 m)   Wt (!) 326 lb (147.9 kg)   SpO2 96%   BMI 40.75 kg/m    GEN: Well nourished, well developed, in no acute distress  HEENT: normal  Neck: no JVD, carotid bruits, or masses Cardiac: RRR;  2/6  Systolic murmur;   rubs, or gallops,no edema  Respiratory:  clear to auscultation bilaterally, normal work of breathing GI: soft, nontender, nondistended, + BS MS: no deformity or atrophy  Skin: warm and dry, no rash Neuro:  Alert and Oriented x 3, Strength and sensation are intact Psych: euthymic mood, full affect  Wt Readings from Last 3 Encounters:  04/06/16 (!) 326 lb (147.9 kg)  04/04/15 (!) 326 lb 6.4 oz (148.1 kg)  02/27/14 (!) 317 lb (143.8 kg)      Studies/Labs Reviewed:   EKG:  EKG  Sinus bradycardia, 59 bpm, left atrial abnormality, 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 When compared to prior tracings, no changes occurred.  Recent Labs: No results found for requested labs within last 8760 hours.   Lipid Panel No results found for: CHOL, TRIG, HDL, CHOLHDL, VLDL, LDLCALC, LDLDIRECT  Additional studies/ records that were reviewed today include:  No recent cardiac functional data or imaging    ASSESSMENT:    1. Coronary artery disease involving native coronary artery of native heart without angina pectoris   2. Scrotal edema   3. Thoracic aortic aneurysm without rupture ([place])   4. Essential hypertension   5. Systolic murmur   6. Mixed hyperlipidemia      PLAN:  In order of problems listed above:  1. Asymptomatic. 2. The scrotal edema is associated with mild lower extremity edema. There does appear to be some evidence of volume excess. We need to assess LV function and also pulmonary artery pressures. An echocardiogram is ordered to help exclude pulmonary hypertension or right ventricular dysfunction. BNP will also be performed. 3. Thoracic aortic aneurysm. Prior repair. Now with a systolic heart murmur. Rule out progression to aortic valve disease. 4. Blood pressure is well controlled. Continue same medications. 5. Systolic murmur, probably aortic valve sclerosis. Rule out progression to stenosis. The valve was resuspended after aortic root replacement at  the time of aneurysm repair.    Medication Adjustments/Labs and Tests Ordered: Current medicines are reviewed at length with the patient today.  Concerns regarding medicines are outlined above.  Medication changes, Labs and Tests ordered today are listed in the Patient Instructions below. Patient Instructions  Medication Instructions:  None  Labwork: BNP and BMET today  Testing/Procedures: Your physician has requested that you have an echocardiogram. Echocardiography is a painless test that uses sound waves to create images of your heart. It provides your doctor with information about the size and shape of your heart and how well your heart's chambers and valves are working. This procedure takes approximately one hour. There are no restrictions for this procedure.    Follow-Up: Your physician wants you to follow-up in: 1  year with Dr. Tamala Julian.  You will receive a reminder letter in the mail two months in advance. If you don't receive a letter, please call our office to schedule the follow-up appointment.   Any Other Special Instructions Will Be Listed Below (If Applicable).     If you need a refill on your cardiac medications before your next appointment, please call your pharmacy.      Signed, Sinclair Grooms, MD  04/06/2016 8:47 AM    [facility] [address], [place], [place]  [address] Phone: [phone]; Fax: [phone]

## 2016-04-07 LAB — BASIC METABOLIC PANEL
BUN / CREAT RATIO: 13 (ref 10–24)
BUN: 12 mg/dL (ref 8–27)
CO2: 25 mmol/L (ref 18–29)
Calcium: 9.8 mg/dL (ref 8.6–10.2)
Chloride: 98 mmol/L (ref 96–106)
Creatinine, Ser: 0.96 mg/dL (ref 0.76–1.27)
GFR calc Af Amer: 98 (ref >59)
GFR, EST NON AFRICAN AMERICAN: 85 (ref >59)
Glucose: 99 mg/dL (ref 65–99)
Potassium: 4.2 mmol/L (ref 3.5–5.2)
Sodium: 141 mmol/L (ref 134–144)

## 2016-04-07 LAB — PRO B NATRIURETIC PEPTIDE: NT-Pro BNP: 37 pg/mL (ref 0–210)

## 2016-04-14 ENCOUNTER — Other Ambulatory Visit: Payer: Self-pay | Admitting: Interventional Cardiology

## 2016-04-14 DIAGNOSIS — I10 Essential (primary) hypertension: Secondary | ICD-10-CM

## 2016-04-14 NOTE — Telephone Encounter (Signed)
Order Providers   Prescribing Provider Encounter Provider  Belva Crome, MD Belva Crome, MD  Medication Detail    Disp Refills Start End   losartan-hydrochlorothiazide [facility]) 100-25 MG tablet 30 tablet 11 04/06/2016    Sig - Route: Take 1 tablet by mouth daily. - Oral   E-Prescribing Status: Receipt confirmed by pharmacy (04/06/2016 8:44 AM EST)   Pharmacy   [id number] - [place], [place] - [address]   Order Providers   Prescribing Provider Encounter Provider  Belva Crome, MD Belva Crome, MD  Medication Detail    Disp Refills Start End   carvedilol (COREG) 6.25 MG tablet 60 tablet 11 04/06/2016    Sig - Route: Take 1 tablet (6.25 mg total) by mouth 2 (two) times daily with a meal. - Oral   E-Prescribing Status: Receipt confirmed by pharmacy (04/06/2016 8:44 AM EST)   Pharmacy   [id number] - [place], [place] - [address]

## 2016-04-22 ENCOUNTER — Other Ambulatory Visit (HOSPITAL_COMMUNITY): Payer: 59

## 2016-04-22 ENCOUNTER — Other Ambulatory Visit: Payer: Self-pay

## 2016-04-22 ENCOUNTER — Ambulatory Visit (HOSPITAL_COMMUNITY): Payer: 59 | Attending: Internal Medicine

## 2016-04-22 DIAGNOSIS — I119 Hypertensive heart disease without heart failure: Secondary | ICD-10-CM | POA: Insufficient documentation

## 2016-04-22 DIAGNOSIS — Z6841 Body Mass Index (BMI) 40.0 and over, adult: Secondary | ICD-10-CM | POA: Diagnosis not present

## 2016-04-22 DIAGNOSIS — I712 Thoracic aortic aneurysm, without rupture, unspecified: Secondary | ICD-10-CM

## 2016-04-22 DIAGNOSIS — I351 Nonrheumatic aortic (valve) insufficiency: Secondary | ICD-10-CM | POA: Diagnosis not present

## 2016-04-22 DIAGNOSIS — E119 Type 2 diabetes mellitus without complications: Secondary | ICD-10-CM | POA: Diagnosis not present

## 2016-04-22 DIAGNOSIS — I359 Nonrheumatic aortic valve disorder, unspecified: Secondary | ICD-10-CM | POA: Diagnosis present

## 2016-04-22 DIAGNOSIS — E785 Hyperlipidemia, unspecified: Secondary | ICD-10-CM | POA: Insufficient documentation

## 2016-04-22 DIAGNOSIS — I2510 Atherosclerotic heart disease of native coronary artery without angina pectoris: Secondary | ICD-10-CM | POA: Diagnosis not present

## 2016-04-22 MED ORDER — PERFLUTREN LIPID MICROSPHERE
1.0000 mL | INTRAVENOUS | Status: AC | PRN
  Administered 2016-04-22: 4 mL via INTRAVENOUS

## 2016-05-12 DIAGNOSIS — H7112 Cholesteatoma of tympanum, left ear: Secondary | ICD-10-CM | POA: Diagnosis not present

## 2016-05-12 DIAGNOSIS — H903 Sensorineural hearing loss, bilateral: Secondary | ICD-10-CM | POA: Diagnosis not present

## 2016-05-12 DIAGNOSIS — H95199 Other disorders following mastoidectomy, unspecified ear: Secondary | ICD-10-CM | POA: Diagnosis not present

## 2016-06-08 DIAGNOSIS — J343 Hypertrophy of nasal turbinates: Secondary | ICD-10-CM | POA: Diagnosis not present

## 2016-06-08 DIAGNOSIS — R0982 Postnasal drip: Secondary | ICD-10-CM | POA: Diagnosis not present

## 2016-06-08 DIAGNOSIS — J342 Deviated nasal septum: Secondary | ICD-10-CM | POA: Diagnosis not present

## 2016-06-17 ENCOUNTER — Other Ambulatory Visit: Payer: Self-pay | Admitting: Interventional Cardiology

## 2016-06-17 NOTE — Telephone Encounter (Signed)
Medication Detail    Disp Refills Start End   rosuvastatin (CRESTOR) 5 MG tablet 30 tablet 11 04/07/2016    Sig - Route: Take 1 tablet (5 mg total) by mouth 3 (three) times a week. - Oral   E-Prescribing Status: Receipt confirmed by pharmacy (04/06/2016 8:44 AM EST)   Pharmacy   [id number] - [place], [place] - [address]

## 2016-06-28 DIAGNOSIS — N401 Enlarged prostate with lower urinary tract symptoms: Secondary | ICD-10-CM | POA: Diagnosis not present

## 2016-06-28 DIAGNOSIS — R3914 Feeling of incomplete bladder emptying: Secondary | ICD-10-CM | POA: Diagnosis not present

## 2016-06-28 DIAGNOSIS — R3915 Urgency of urination: Secondary | ICD-10-CM | POA: Diagnosis not present

## 2016-06-28 DIAGNOSIS — R972 Elevated prostate specific antigen [PSA]: Secondary | ICD-10-CM | POA: Diagnosis not present

## 2016-08-25 DIAGNOSIS — M25571 Pain in right ankle and joints of right foot: Secondary | ICD-10-CM | POA: Diagnosis not present

## 2016-09-24 DIAGNOSIS — K5730 Diverticulosis of large intestine without perforation or abscess without bleeding: Secondary | ICD-10-CM | POA: Diagnosis not present

## 2016-09-24 DIAGNOSIS — D126 Benign neoplasm of colon, unspecified: Secondary | ICD-10-CM | POA: Diagnosis not present

## 2016-09-24 DIAGNOSIS — Z86010 Personal history of colonic polyps: Secondary | ICD-10-CM | POA: Diagnosis not present

## 2016-10-12 DIAGNOSIS — H95199 Other disorders following mastoidectomy, unspecified ear: Secondary | ICD-10-CM | POA: Diagnosis not present

## 2016-10-12 DIAGNOSIS — H7112 Cholesteatoma of tympanum, left ear: Secondary | ICD-10-CM | POA: Diagnosis not present

## 2016-10-12 DIAGNOSIS — H903 Sensorineural hearing loss, bilateral: Secondary | ICD-10-CM | POA: Diagnosis not present

## 2016-10-14 DIAGNOSIS — I2510 Atherosclerotic heart disease of native coronary artery without angina pectoris: Secondary | ICD-10-CM | POA: Diagnosis not present

## 2016-10-14 DIAGNOSIS — E781 Pure hyperglyceridemia: Secondary | ICD-10-CM | POA: Diagnosis not present

## 2016-10-14 DIAGNOSIS — I10 Essential (primary) hypertension: Secondary | ICD-10-CM | POA: Diagnosis not present

## 2016-11-22 DIAGNOSIS — R0683 Snoring: Secondary | ICD-10-CM | POA: Diagnosis not present

## 2016-11-22 DIAGNOSIS — G478 Other sleep disorders: Secondary | ICD-10-CM | POA: Diagnosis not present

## 2016-11-22 DIAGNOSIS — R0681 Apnea, not elsewhere classified: Secondary | ICD-10-CM | POA: Diagnosis not present

## 2016-12-27 DIAGNOSIS — G4733 Obstructive sleep apnea (adult) (pediatric): Secondary | ICD-10-CM | POA: Diagnosis not present

## 2016-12-28 DIAGNOSIS — R972 Elevated prostate specific antigen [PSA]: Secondary | ICD-10-CM | POA: Diagnosis not present

## 2016-12-28 DIAGNOSIS — N401 Enlarged prostate with lower urinary tract symptoms: Secondary | ICD-10-CM | POA: Diagnosis not present

## 2017-01-02 DIAGNOSIS — G4733 Obstructive sleep apnea (adult) (pediatric): Secondary | ICD-10-CM | POA: Diagnosis not present

## 2017-01-18 DIAGNOSIS — R102 Pelvic and perineal pain: Secondary | ICD-10-CM | POA: Diagnosis not present

## 2017-01-18 DIAGNOSIS — N401 Enlarged prostate with lower urinary tract symptoms: Secondary | ICD-10-CM | POA: Diagnosis not present

## 2017-01-18 DIAGNOSIS — R972 Elevated prostate specific antigen [PSA]: Secondary | ICD-10-CM | POA: Diagnosis not present

## 2017-01-20 DIAGNOSIS — G4733 Obstructive sleep apnea (adult) (pediatric): Secondary | ICD-10-CM | POA: Diagnosis not present

## 2017-02-01 DIAGNOSIS — H7413 Adhesive middle ear disease, bilateral: Secondary | ICD-10-CM | POA: Diagnosis not present

## 2017-02-01 DIAGNOSIS — H95199 Other disorders following mastoidectomy, unspecified ear: Secondary | ICD-10-CM | POA: Diagnosis not present

## 2017-02-01 DIAGNOSIS — H7203 Central perforation of tympanic membrane, bilateral: Secondary | ICD-10-CM | POA: Diagnosis not present

## 2017-02-01 DIAGNOSIS — H7112 Cholesteatoma of tympanum, left ear: Secondary | ICD-10-CM | POA: Diagnosis not present

## 2017-02-01 DIAGNOSIS — R972 Elevated prostate specific antigen [PSA]: Secondary | ICD-10-CM | POA: Diagnosis not present

## 2017-02-01 DIAGNOSIS — N401 Enlarged prostate with lower urinary tract symptoms: Secondary | ICD-10-CM | POA: Diagnosis not present

## 2017-02-01 DIAGNOSIS — H60331 Swimmer's ear, right ear: Secondary | ICD-10-CM | POA: Diagnosis not present

## 2017-02-01 DIAGNOSIS — H73001 Acute myringitis, right ear: Secondary | ICD-10-CM | POA: Diagnosis not present

## 2017-02-20 DIAGNOSIS — G4733 Obstructive sleep apnea (adult) (pediatric): Secondary | ICD-10-CM | POA: Diagnosis not present

## 2017-02-24 DIAGNOSIS — H7203 Central perforation of tympanic membrane, bilateral: Secondary | ICD-10-CM | POA: Diagnosis not present

## 2017-02-24 DIAGNOSIS — H903 Sensorineural hearing loss, bilateral: Secondary | ICD-10-CM | POA: Diagnosis not present

## 2017-02-24 DIAGNOSIS — H95199 Other disorders following mastoidectomy, unspecified ear: Secondary | ICD-10-CM | POA: Diagnosis not present

## 2017-03-23 DIAGNOSIS — G4733 Obstructive sleep apnea (adult) (pediatric): Secondary | ICD-10-CM | POA: Diagnosis not present

## 2017-04-13 DIAGNOSIS — H73001 Acute myringitis, right ear: Secondary | ICD-10-CM | POA: Diagnosis not present

## 2017-04-13 DIAGNOSIS — H7203 Central perforation of tympanic membrane, bilateral: Secondary | ICD-10-CM | POA: Diagnosis not present

## 2017-04-13 DIAGNOSIS — H60331 Swimmer's ear, right ear: Secondary | ICD-10-CM | POA: Diagnosis not present

## 2017-04-19 NOTE — Progress Notes (Signed)
Cardiology Office Note    Date:  04/20/2017   ID:  Jacob Small, DOB January 21, 1956, MRN [id number]  PCP:  Gaynelle Arabian, MD  Cardiologist: Sinclair Grooms, MD   Chief Complaint  Patient presents with  . Atrial Fibrillation    History of Present Illness:  Jacob Small is a 62 y.o. male who presents for Bicuspid aortic valve, ascending aortic aneurysm, status post aneurysm repair and valve sparing aortic root replacement , hypertension, and hyperlipidemia.   Doing well without symptoms of heart failure.  Specifically denies orthopnea, PND, chest pain, syncope, tachycardia, palpitations.  No significant lower extremity swelling.  No orthopnea.  No change in exertional tolerance.  Episodes of sporadic left lateral lower chest discomfort characterized as a pinching.  It is nonexertional.  Occurs while at rest and lasts anywhere from seconds to minutes.  Past Medical History:  Diagnosis Date  . Aortic aneurysm, thoracic ([place])   . Bilateral hearing loss   . CAD (coronary artery disease)   . CAD (coronary artery disease), native coronary artery    Aortic Root Replacement and CABGx2 on 09/22/2010  Dr. Roxy Manns  . HTN (hypertension)   . Hyperlipidemia   . Myocardial infarction ([place])   . Obesity, morbid ([place])   . S/P CABG x 2 09/22/2010   LIMA to LAD, SVG to PDA  . S/P thoracic aortic aneurysm repair 09/22/2010   Valve-sparing aortic root replacement (David Type I)    Past Surgical History:  Procedure Laterality Date  . AORTIC ROOT REPLACEMENT  09/22/2010   Median sternotomy for valve-sparing aortic root replacemnt Shanon Brow I reimplanation technique) with resection of engrafting of the ascending thoracic aorta .Partial circulatoru arrest, coronaru aartery bypass grafting X2 (left intrnal mammary artery to distal left anterior descending coronary artery, saphenous vein graft to posterrior descending coronary artery, EVH rt thigh  . CORONARY ARTERY BYPASS GRAFT  09/22/2010   CABGx2 with  LIMA to LAD, SVG to PDA  . EXTERNAL EAR SURGERY    . HAND SURGERY    . KNEE DEBRIDEMENT     02/2008 CHONDROPLASTY/DEB. OF MENISCUS  . PILONIDAL CYST EXCISION    . PROSTATE SURGERY  09/30/10   BX  2010 NEG    Current Medications: Outpatient Medications Prior to Visit  Medication Sig Dispense Refill  . aspirin EC 81 MG tablet Take 1 tablet (81 mg total) by mouth daily. 90 tablet 3  . amLODipine (NORVASC) 5 MG tablet Take 1 tablet (5 mg total) by mouth daily. 30 tablet 11  . carvedilol (COREG) 6.25 MG tablet Take 1 tablet (6.25 mg total) by mouth 2 (two) times daily with a meal. 60 tablet 11  . losartan-hydrochlorothiazide (HYZAAR) 100-25 MG tablet Take 1 tablet by mouth daily. 30 tablet 11  . rosuvastatin (CRESTOR) 5 MG tablet Take 1 tablet (5 mg total) by mouth 3 (three) times a week. 30 tablet 11  . furosemide (LASIX) 40 MG tablet Take 40 mg by mouth daily.     Marland Kitchen KLOR-CON 10 10 MEQ CR tablet Take 10 mEq by mouth daily.      No facility-administered medications prior to visit.      Allergies:   Lisinopril and Toprol xl [metoprolol succinate]   Social History   Socioeconomic History  . Marital status: Married    Spouse name: None  . Number of children: None  . Years of education: None  . Highest education level: None  Social Needs  . Financial resource strain: None  .  Food insecurity - worry: None  . Food insecurity - inability: None  . Transportation needs - medical: None  . Transportation needs - non-medical: None  Occupational History  . None  Tobacco Use  . Smoking status: Former Smoker    Last attempt to quit: 02/16/1995    Years since quitting: 22.1  . Smokeless tobacco: Former Network engineer and Sexual Activity  . Alcohol use: Yes    Alcohol/week: 0.0 oz  . Drug use: No  . Sexual activity: None  Other Topics Concern  . None  Social History Narrative   Married ,lives in [place]     Family History:  The patient's  family history includes Cancer in his  brother; Heart disease in his father.   ROS:   Please see the history of present illness.    Right ear MRSA infection.  Otherwise no complaints. All other systems reviewed and are negative.   PHYSICAL EXAM:   VS:  BP (!) 144/76   Pulse (!) 54   Ht 6\' 3"  (1.905 m)   Wt (!) 324 lb 12.8 oz (147.3 kg)   BMI 40.60 kg/m    GEN: Well nourished, well developed, in no acute distress  HEENT: normal  Neck: no JVD, carotid bruits, or masses Cardiac: 2/6 crescendo decrescendo systolic murmur RRR; no rubs, or gallops,no edema  Respiratory:  clear to auscultation bilaterally, normal work of breathing GI: soft, nontender, nondistended, + BS MS: no deformity or atrophy  Skin: warm and dry, no rash Neuro:  Alert and Oriented x 3, Strength and sensation are intact Psych: euthymic mood, full affect  Wt Readings from Last 3 Encounters:  04/20/17 (!) 324 lb 12.8 oz (147.3 kg)  04/06/16 (!) 326 lb (147.9 kg)  04/04/15 (!) 326 lb 6.4 oz (148.1 kg)      Studies/Labs Reviewed:   EKG:  EKG sinus bradycardia 54 bpm.  Small inferior Q waves.  No change compared to prior.  Disagree with computer interpretation of junctional rhythm.  No change when compared to prior.  Recent Labs: No results found for requested labs within last 8760 hours.   Lipid Panel No results found for: CHOL, TRIG, HDL, CHOLHDL, VLDL, LDLCALC, LDLDIRECT  Additional studies/ records that were reviewed today include:  ------------------------------------------------------------------- 2D Doppler echocardiogram 04/22/2016:  Study Conclusions   - Procedure narrative: Transthoracic echocardiography. Image   quality was suboptimal. The study was technically difficult.   Intravenous contrast (Definity) was administered. - Left ventricle: The cavity size was normal. Wall thickness was   increased in a pattern of mild LVH. Systolic function was normal.   The estimated ejection fraction was in the range of 60% to 65%.   Doppler  parameters are consistent with abnormal left ventricular   relaxation (grade 1 diastolic dysfunction). - Aortic valve: There was trivial regurgitation. - Aorta: Aortic root is normal at 39 mm. - Left atrium: The atrium was mildly dilated.     ASSESSMENT:    1. Bicuspid aortic valve   2. Coronary artery disease involving coronary bypass graft of native heart with angina pectoris ([place])   3. Mixed hyperlipidemia   4. Essential hypertension   5. S/P CABG x 2      PLAN:  In order of problems listed above:  1. The recent echocardiogram done less than a year ago does not demonstrate any evidence of progression to stenosis.  Trivial regurgitation is noted.  Continue clinical follow-up. 2. Asymptomatic with reference to angina.  Encouraged active lifestyle.  3. LDL target less than 70.  Continue low-dose rosuvastatin because he had symptoms related to higher dose therapy.  Symptoms were musculoskeletal. 4. Blood pressure target less than 140/90 mmHg.  Risk factor modification, weight loss, aerobic exercise, and compliance with regimen.  Clinical follow-up in 1 year.  Medication Adjustments/Labs and Tests Ordered: Current medicines are reviewed at length with the patient today.  Concerns regarding medicines are outlined above.  Medication changes, Labs and Tests ordered today are listed in the Patient Instructions below. Patient Instructions  Medication Instructions:  Your physician recommends that you continue on your current medications as directed. Please refer to the Current Medication list given to you today.  Follow-Up: Your physician wants you to follow-up in: 1 year with Dr. Tamala Julian.  You will receive a reminder letter in the mail two months in advance. If you don't receive a letter, please call our office to schedule the follow-up appointment.    Any Other Special Instructions Will Be Listed Below (If Applicable).     If you need a refill on your cardiac medications before  your next appointment, please call your pharmacy.      Signed, Sinclair Grooms, MD  04/20/2017 8:59 AM    [address], [place],   [address] Phone: [phone]; Fax: [phone]

## 2017-04-20 ENCOUNTER — Encounter: Payer: Self-pay | Admitting: Interventional Cardiology

## 2017-04-20 ENCOUNTER — Ambulatory Visit: Payer: 59 | Admitting: Interventional Cardiology

## 2017-04-20 VITALS — BP 144/76 | HR 54 | Ht 75.0 in | Wt 324.8 lb

## 2017-04-20 DIAGNOSIS — I10 Essential (primary) hypertension: Secondary | ICD-10-CM | POA: Diagnosis not present

## 2017-04-20 DIAGNOSIS — Q2381 Bicuspid aortic valve: Secondary | ICD-10-CM

## 2017-04-20 DIAGNOSIS — Z951 Presence of aortocoronary bypass graft: Secondary | ICD-10-CM

## 2017-04-20 DIAGNOSIS — I25709 Atherosclerosis of coronary artery bypass graft(s), unspecified, with unspecified angina pectoris: Secondary | ICD-10-CM

## 2017-04-20 DIAGNOSIS — E782 Mixed hyperlipidemia: Secondary | ICD-10-CM | POA: Diagnosis not present

## 2017-04-20 DIAGNOSIS — Q231 Congenital insufficiency of aortic valve: Secondary | ICD-10-CM | POA: Diagnosis not present

## 2017-04-20 MED ORDER — CARVEDILOL 6.25 MG PO TABS: 6.2500 mg | ORAL_TABLET | Freq: Two times a day (BID) | ORAL | 11 refills | Status: DC

## 2017-04-20 MED ORDER — ROSUVASTATIN CALCIUM 5 MG PO TABS
5.0000 mg | ORAL_TABLET | ORAL | 11 refills | Status: DC
Start: 2017-04-20 — End: 2018-05-12

## 2017-04-20 MED ORDER — AMLODIPINE BESYLATE 5 MG PO TABS: 5.0000 mg | ORAL_TABLET | Freq: Every day | ORAL | 11 refills | Status: DC

## 2017-04-20 MED ORDER — LOSARTAN POTASSIUM-HCTZ 100-25 MG PO TABS: 1.0000 | ORAL_TABLET | Freq: Every day | ORAL | 11 refills | Status: DC

## 2017-04-20 NOTE — Patient Instructions (Addendum)
Medication Instructions:  Your physician recommends that you continue on your current medications as directed. Please refer to the Current Medication list given to you today.  Follow-Up: Your physician wants you to follow-up in: 1 year with Dr. Tamala Julian.  You will receive a reminder letter in the mail two months in advance. If you don't receive a letter, please call our office to schedule the follow-up appointment.    Any Other Special Instructions Will Be Listed Below (If Applicable).     If you need a refill on your cardiac medications before your next appointment, please call your pharmacy.

## 2017-04-27 DIAGNOSIS — H7203 Central perforation of tympanic membrane, bilateral: Secondary | ICD-10-CM | POA: Diagnosis not present

## 2017-04-27 DIAGNOSIS — H95199 Other disorders following mastoidectomy, unspecified ear: Secondary | ICD-10-CM | POA: Diagnosis not present

## 2017-04-27 DIAGNOSIS — H903 Sensorineural hearing loss, bilateral: Secondary | ICD-10-CM | POA: Diagnosis not present

## 2017-05-03 ENCOUNTER — Other Ambulatory Visit: Payer: Self-pay | Admitting: Otolaryngology

## 2017-05-03 DIAGNOSIS — H903 Sensorineural hearing loss, bilateral: Secondary | ICD-10-CM

## 2017-05-03 DIAGNOSIS — H60331 Swimmer's ear, right ear: Secondary | ICD-10-CM

## 2017-05-04 DIAGNOSIS — R7301 Impaired fasting glucose: Secondary | ICD-10-CM | POA: Diagnosis not present

## 2017-05-04 DIAGNOSIS — I2510 Atherosclerotic heart disease of native coronary artery without angina pectoris: Secondary | ICD-10-CM | POA: Diagnosis not present

## 2017-05-04 DIAGNOSIS — R972 Elevated prostate specific antigen [PSA]: Secondary | ICD-10-CM | POA: Diagnosis not present

## 2017-05-04 DIAGNOSIS — E781 Pure hyperglyceridemia: Secondary | ICD-10-CM | POA: Diagnosis not present

## 2017-05-04 DIAGNOSIS — I10 Essential (primary) hypertension: Secondary | ICD-10-CM | POA: Diagnosis not present

## 2017-05-13 ENCOUNTER — Ambulatory Visit
Admission: RE | Admit: 2017-05-13 | Discharge: 2017-05-13 | Disposition: A | Discharge status: Home / Self Care | Payer: 59 | Source: Ambulatory Visit | Attending: Otolaryngology | Admitting: Otolaryngology

## 2017-05-13 DIAGNOSIS — H6691 Otitis media, unspecified, right ear: Secondary | ICD-10-CM | POA: Diagnosis not present

## 2017-05-13 DIAGNOSIS — H60331 Swimmer's ear, right ear: Secondary | ICD-10-CM

## 2017-05-13 DIAGNOSIS — H903 Sensorineural hearing loss, bilateral: Secondary | ICD-10-CM

## 2017-05-13 MED ORDER — IOPAMIDOL (ISOVUE-300) INJECTION 61%
75.0000 mL | Freq: Once | INTRAVENOUS | Status: AC | PRN
  Administered 2017-05-13: 75 mL via INTRAVENOUS

## 2017-05-17 DIAGNOSIS — S61421A Laceration with foreign body of right hand, initial encounter: Secondary | ICD-10-CM | POA: Diagnosis not present

## 2017-06-21 DIAGNOSIS — H906 Mixed conductive and sensorineural hearing loss, bilateral: Secondary | ICD-10-CM | POA: Diagnosis not present

## 2017-06-21 DIAGNOSIS — H7203 Central perforation of tympanic membrane, bilateral: Secondary | ICD-10-CM | POA: Diagnosis not present

## 2017-06-21 DIAGNOSIS — H95199 Other disorders following mastoidectomy, unspecified ear: Secondary | ICD-10-CM | POA: Diagnosis not present

## 2017-07-14 DIAGNOSIS — L97529 Non-pressure chronic ulcer of other part of left foot with unspecified severity: Secondary | ICD-10-CM | POA: Diagnosis not present

## 2017-07-16 ENCOUNTER — Emergency Department (HOSPITAL_COMMUNITY)
Admission: EM | Admit: 2017-07-16 | Discharge: 2017-07-16 | Disposition: A | Discharge status: Home / Self Care | Payer: 59 | Attending: Emergency Medicine | Admitting: Emergency Medicine

## 2017-07-16 ENCOUNTER — Encounter (HOSPITAL_COMMUNITY): Payer: Self-pay | Admitting: Emergency Medicine

## 2017-07-16 ENCOUNTER — Other Ambulatory Visit: Payer: Self-pay

## 2017-07-16 DIAGNOSIS — Z7982 Long term (current) use of aspirin: Secondary | ICD-10-CM | POA: Diagnosis not present

## 2017-07-16 DIAGNOSIS — Z87891 Personal history of nicotine dependence: Secondary | ICD-10-CM | POA: Insufficient documentation

## 2017-07-16 DIAGNOSIS — I2510 Atherosclerotic heart disease of native coronary artery without angina pectoris: Secondary | ICD-10-CM | POA: Diagnosis not present

## 2017-07-16 DIAGNOSIS — T25222A Burn of second degree of left foot, initial encounter: Secondary | ICD-10-CM | POA: Diagnosis not present

## 2017-07-16 DIAGNOSIS — T310 Burns involving less than 10% of body surface: Secondary | ICD-10-CM | POA: Diagnosis not present

## 2017-07-16 DIAGNOSIS — T25022A Burn of unspecified degree of left foot, initial encounter: Secondary | ICD-10-CM

## 2017-07-16 DIAGNOSIS — I10 Essential (primary) hypertension: Secondary | ICD-10-CM | POA: Diagnosis not present

## 2017-07-16 DIAGNOSIS — T25022D Burn of unspecified degree of left foot, subsequent encounter: Secondary | ICD-10-CM | POA: Insufficient documentation

## 2017-07-16 DIAGNOSIS — I252 Old myocardial infarction: Secondary | ICD-10-CM | POA: Diagnosis not present

## 2017-07-16 DIAGNOSIS — Z79899 Other long term (current) drug therapy: Secondary | ICD-10-CM | POA: Insufficient documentation

## 2017-07-16 DIAGNOSIS — X19XXXA Contact with other heat and hot substances, initial encounter: Secondary | ICD-10-CM | POA: Diagnosis not present

## 2017-07-16 MED ORDER — SULFAMETHOXAZOLE-TRIMETHOPRIM 800-160 MG PO TABS: 1.0000 | ORAL_TABLET | Freq: Two times a day (BID) | ORAL | 0 refills | Status: DC

## 2017-07-16 NOTE — Discharge Instructions (Signed)
You were seen in the emergency department for a burn on your left foot. Continue to apply Mupirocin to the burned area 3-4 times per day until the area has healed. We have additionally started you on Bactrim, an oral antibiotic, for the burn. Please take all of your antibiotics until finished. You may develop abdominal discomfort or diarrhea from the antibiotic.  You may help offset this with probiotics which you can buy at the store (ask your pharmacist if unable to find) or get probiotics in the form of eating yogurt. Do not eat or take the probiotics until 2 hours after your antibiotic. If you are unable to tolerate these side effects follow-up with your primary care provider or return to the emergency department.   If you begin to experience any blistering, rashes, swelling, or difficulty breathing seek medical care for evaluation of potentially more serious side effects.   Please be aware that this medication may interact with other medications you are taking, please be sure to discuss your medication list with your pharmacist.   Remain non weight bearing on the left foot as much as possible.   Call plastic surgeon Dr. Marla Roe tomorrow to make an appointment within the next 1 week for re-evaluation of the burn.  Return to the emergency department for any new or worsening symptoms including, but not limited to increased redness or warmth around the burn, purulent discharge from the burn, fever, chills, nausea, or vomiting.

## 2017-07-16 NOTE — ED Triage Notes (Signed)
Pt reports large blister on the bottom of his foot since Tuesday after walking on hot sand, states he went to urgent care on Thursday and was given mupirocin ointment which has given him no relief. States lesion was draining serous fluid yesterday. Denies any hx of diabetes.

## 2017-07-16 NOTE — ED Provider Notes (Signed)
[facility] EMERGENCY DEPARTMENT Provider Note   CSN: [id number] Arrival date & time: 07/16/17  0708     History   Chief Complaint Chief Complaint  Patient presents with  . Foot Pain    HPI Jacob Small is a 62 y.o. male with a hx of CAD, HTN, and hyperlipidemia who presents to the ED for burn to the L foot which occurred 5 days ago. Patient states he was at the beach, stepped on the hot sand, and this resulted in a blister type burn to the plantar surface of the R foot. The blister popped the following day. He was seen at an urgent care 2 days ago- was given mupirocin ointment to apply to the burn which he has been doing. He states he came in today because it has been painful and his foot has been swollen over past 48 hours. No specific alleviating/aggravating factors. Denies fevers, chills, numbness, weakness, or pain in any areas other than the burn. Last tetanus within past 1 year.   HPI  Past Medical History:  Diagnosis Date  . Aortic aneurysm, thoracic ([place])   . Bilateral hearing loss   . CAD (coronary artery disease)   . CAD (coronary artery disease), native coronary artery    Aortic Root Replacement and CABGx2 on 09/22/2010  Dr. Roxy Manns  . HTN (hypertension)   . Hyperlipidemia   . Myocardial infarction ([place])   . Obesity, morbid ([place])   . S/P CABG x 2 09/22/2010   LIMA to LAD, SVG to PDA  . S/P thoracic aortic aneurysm repair 09/22/2010   Valve-sparing aortic root replacement Shanon Brow Type I)    Patient Active Problem List   Diagnosis Date Noted  . Bradycardia on ECG 02/27/2014  . Umbilical hernia 62/94/7654  . Chronic mastoiditis of right side 10/16/2010  . Pleural effusion on left 10/16/2010  . Hyperlipidemia   . BPH (benign prostatic hyperplasia)   . Obesity, morbid ([place])   . Bilateral hearing loss   . Coronary artery disease involving coronary bypass graft of native heart with angina pectoris ([place])   . S/P CABG x 2 09/22/2010  . S/P thoracic  aortic aneurysm repair 09/22/2010    Past Surgical History:  Procedure Laterality Date  . AORTIC ROOT REPLACEMENT  09/22/2010   Median sternotomy for valve-sparing aortic root replacemnt Shanon Brow I reimplanation technique) with resection of engrafting of the ascending thoracic aorta .Partial circulatoru arrest, coronaru aartery bypass grafting X2 (left intrnal mammary artery to distal left anterior descending coronary artery, saphenous vein graft to posterrior descending coronary artery, EVH rt thigh  . CORONARY ARTERY BYPASS GRAFT  09/22/2010   CABGx2 with LIMA to LAD, SVG to PDA  . EXTERNAL EAR SURGERY    . HAND SURGERY    . KNEE DEBRIDEMENT     02/2008 CHONDROPLASTY/DEB. OF MENISCUS  . PILONIDAL CYST EXCISION    . PROSTATE SURGERY  09/30/10   BX  2010 NEG        Home Medications    Prior to Admission medications   Medication Sig Start Date End Date Taking? Authorizing Provider  amLODipine (NORVASC) 5 MG tablet Take 1 tablet (5 mg total) by mouth daily. 04/20/17   Belva Crome, MD  aspirin EC 81 MG tablet Take 1 tablet (81 mg total) by mouth daily. 04/04/15   Belva Crome, MD  carvedilol (COREG) 6.25 MG tablet Take 1 tablet (6.25 mg total) by mouth 2 (two) times daily with a meal. 04/20/17  Belva Crome, MD  losartan-hydrochlorothiazide (HYZAAR) 100-25 MG tablet Take 1 tablet by mouth daily. 04/20/17   Belva Crome, MD  rosuvastatin (CRESTOR) 5 MG tablet Take 1 tablet (5 mg total) by mouth 3 (three) times a week. 04/20/17   Belva Crome, MD  telmisartan (MICARDIS) 40 MG tablet Take 40 mg by mouth daily.   05/03/11  [provider]  XARELTO 20 MG TABS Take 1 capsule by mouth once.  11/23/10 05/03/11  [provider]    Family History Family History  Problem Relation Age of Onset  . Heart disease Father   . Cancer Brother        skin    Social History Social History   Tobacco Use  . Smoking status: Former Smoker    Last attempt to quit: 02/16/1995    Years  since quitting: 22.4  . Smokeless tobacco: Former Network engineer Use Topics  . Alcohol use: Yes    Alcohol/week: 0.0 oz  . Drug use: No     Allergies   Lisinopril and Toprol xl [metoprolol succinate]   Review of Systems Review of Systems  Constitutional: Negative for chills and fever.  Skin: Positive for wound.  Neurological: Negative for weakness and numbness.     Physical Exam Updated Vital Signs BP (!) 118/100 (BP Location: Right Arm)   Pulse 74   Temp 98 F (36.7 C) (Oral)   Resp 20   SpO2 99%   Physical Exam  Constitutional: He appears well-developed and well-nourished. No distress.  HENT:  Head: Normocephalic and atraumatic.  Eyes: Conjunctivae are normal. Right eye exhibits no discharge. Left eye exhibits no discharge.  Cardiovascular:  2+ symmetric DP pulse  Neurological: He is alert.  Clear speech. 5/5 strength with plantar/dorsiflexion bilaterally. Sensation grossly intact to bilateral lower extremities.   Skin: Capillary refill takes less than 2 seconds.  L foot: There is an approximately 4cm diameter area of burned skin with appearance of previously popped blister. Tender to palpation.There is mild surrounding erythema. No overlying warmth.   Psychiatric: He has a normal mood and affect. His behavior is normal. Thought content normal.  Nursing note and vitals reviewed.     ED Treatments / Results  Labs (all labs ordered are listed, but only abnormal results are displayed) Labs Reviewed - No data to display  EKG None  Radiology No results found.  Procedures Procedures (including critical care time)  Medications Ordered in ED Medications - No data to display   Initial Impression / Assessment and Plan / ED Course  I have reviewed the triage vital signs and the nursing notes.  Pertinent labs & imaging results that were available during my care of the patient were reviewed by me and considered in my medical decision making (see chart for  details).    Patient presents with complaint of burn to the L foot.  He is nontoxic appearing, vitals WNL with the exception of elevated blood pressure- doubt HTN emergency patient aware of need for recheck, normalized with repeat vitals. Previous blister with remaining burn to plantar surface of the L foot, mild surrounding erythema, no warmth. NVI distally. Tetanus is up to date. Discussed with findings and image above with supervising physician Dr. Gilford Raid, recommends Bactrim and avoidance of weight bearing. In agreement with plan and will carry out as discussed, discussed bactrim in combination with patient's blood pressure medicines with pharmacy who instructs this should be safe. Will have patient follow up with plastic  surgeon Dr. Marla Roe for re-evaluation. I discussed treatment plan, need for plastic surgery follow-up, and return precautions with the patient. Provided opportunity for questions, patient confirmed understanding and is in agreement with plan.   Vitals:   07/16/17 0713 07/16/17 1020  BP: (!) 118/100 129/81  Pulse: 74 74  Resp: 20 18  Temp: 98 F (36.7 C)   TempSrc: Oral   SpO2: 99% 98%   Final Clinical Impressions(s) / ED Diagnoses   Final diagnoses:  Burn of left foot, unspecified burn degree, initial encounter    ED Discharge Orders        Ordered    sulfamethoxazole-trimethoprim (BACTRIM DS,SEPTRA DS) 800-160 MG tablet  2 times daily     07/16/17 0944       Shaleen Talamantez, Glynda Jaeger, PA-C 07/16/17 1550    Isla Pence, MD 07/17/17 [phone]

## 2017-07-18 DIAGNOSIS — T300 Burn of unspecified body region, unspecified degree: Secondary | ICD-10-CM | POA: Diagnosis not present

## 2017-07-24 ENCOUNTER — Encounter (HOSPITAL_COMMUNITY): Payer: Self-pay | Admitting: Nurse Practitioner

## 2017-07-24 ENCOUNTER — Other Ambulatory Visit: Payer: Self-pay

## 2017-07-24 ENCOUNTER — Emergency Department (HOSPITAL_COMMUNITY)
Admission: EM | Admit: 2017-07-24 | Discharge: 2017-07-24 | Disposition: A | Discharge status: Home / Self Care | Payer: 59 | Attending: Emergency Medicine | Admitting: Emergency Medicine

## 2017-07-24 DIAGNOSIS — Y939 Activity, unspecified: Secondary | ICD-10-CM | POA: Insufficient documentation

## 2017-07-24 DIAGNOSIS — Y929 Unspecified place or not applicable: Secondary | ICD-10-CM | POA: Insufficient documentation

## 2017-07-24 DIAGNOSIS — I252 Old myocardial infarction: Secondary | ICD-10-CM | POA: Insufficient documentation

## 2017-07-24 DIAGNOSIS — W57XXXA Bitten or stung by nonvenomous insect and other nonvenomous arthropods, initial encounter: Secondary | ICD-10-CM | POA: Diagnosis not present

## 2017-07-24 DIAGNOSIS — S00262A Insect bite (nonvenomous) of left eyelid and periocular area, initial encounter: Secondary | ICD-10-CM | POA: Diagnosis not present

## 2017-07-24 DIAGNOSIS — I2510 Atherosclerotic heart disease of native coronary artery without angina pectoris: Secondary | ICD-10-CM | POA: Diagnosis not present

## 2017-07-24 DIAGNOSIS — Y998 Other external cause status: Secondary | ICD-10-CM | POA: Insufficient documentation

## 2017-07-24 DIAGNOSIS — E785 Hyperlipidemia, unspecified: Secondary | ICD-10-CM | POA: Insufficient documentation

## 2017-07-24 DIAGNOSIS — Z87891 Personal history of nicotine dependence: Secondary | ICD-10-CM | POA: Diagnosis not present

## 2017-07-24 DIAGNOSIS — Z951 Presence of aortocoronary bypass graft: Secondary | ICD-10-CM | POA: Diagnosis not present

## 2017-07-24 DIAGNOSIS — L509 Urticaria, unspecified: Secondary | ICD-10-CM | POA: Insufficient documentation

## 2017-07-24 DIAGNOSIS — I10 Essential (primary) hypertension: Secondary | ICD-10-CM | POA: Diagnosis not present

## 2017-07-24 MED ORDER — FAMOTIDINE 40 MG PO TABS: 40.0000 mg | ORAL_TABLET | Freq: Every day | ORAL | 0 refills | Status: DC

## 2017-07-24 MED ORDER — PREDNISONE 20 MG PO TABS: 40.0000 mg | ORAL_TABLET | Freq: Every day | ORAL | 0 refills | Status: AC

## 2017-07-24 MED ORDER — PREDNISONE 20 MG PO TABS
40.0000 mg | ORAL_TABLET | Freq: Once | ORAL | Status: AC
  Administered 2017-07-24: 40 mg via ORAL
  Filled 2017-07-24: qty 2

## 2017-07-24 MED ORDER — EPINEPHRINE 0.3 MG/0.3ML IJ SOAJ: 0.3000 mg | Freq: Once | INTRAMUSCULAR | 0 refills | Status: AC

## 2017-07-24 MED ORDER — FAMOTIDINE 20 MG PO TABS
40.0000 mg | ORAL_TABLET | Freq: Once | ORAL | Status: AC
  Administered 2017-07-24: 40 mg via ORAL
  Filled 2017-07-24: qty 2

## 2017-07-24 NOTE — Discharge Instructions (Signed)

## 2017-07-24 NOTE — ED Notes (Signed)
Pt was educated about blood pressure being elevated on departure, Pt stated "he checks it everyday and will monitor to make sure it goes down"

## 2017-07-24 NOTE — ED Triage Notes (Signed)
Pt reports he is experiencing generalized hives and itching from a bee sting on his left eye. Reports he has taken 4 OTC benadryls without any improvement of his symptoms.

## 2017-07-24 NOTE — ED Provider Notes (Signed)
Emergency Department Provider Note   I have reviewed the triage vital signs and the nursing notes.   HISTORY  Chief Complaint Urticaria and Insect Bite   HPI Jacob Small is a 62 y.o. male with PMH of CAD, HTN, HLD, elevated BMI, and thoracic aortic aneurysm s/p aortic root repair presents to the emergency department for evaluation of hive type reaction and left eye edema.  The patient was weeding in his car around lunchtime when an insect flew into the car which he thinks was a bee.  He swatted the be into his face striking himself in the left eye.  He denies any severe pain but shortly after began to have itchy rash over his arms and legs which will resolve and then turn up in a new location. No sore throat, SOB, or tongue swelling. He took Benadryl with no significant improvement.    Past Medical History:  Diagnosis Date  . Aortic aneurysm, thoracic ([place])   . Bilateral hearing loss   . CAD (coronary artery disease)   . CAD (coronary artery disease), native coronary artery    Aortic Root Replacement and CABGx2 on 09/22/2010  Dr. Roxy Manns  . HTN (hypertension)   . Hyperlipidemia   . Myocardial infarction ([place])   . Obesity, morbid ([place])   . S/P CABG x 2 09/22/2010   LIMA to LAD, SVG to PDA  . S/P thoracic aortic aneurysm repair 09/22/2010   Valve-sparing aortic root replacement Shanon Brow Type I)    Patient Active Problem List   Diagnosis Date Noted  . Bradycardia on ECG 02/27/2014  . Umbilical hernia 50/35/4656  . Chronic mastoiditis of right side 10/16/2010  . Pleural effusion on left 10/16/2010  . Hyperlipidemia   . BPH (benign prostatic hyperplasia)   . Obesity, morbid ([place])   . Bilateral hearing loss   . Coronary artery disease involving coronary bypass graft of native heart with angina pectoris ([place])   . S/P CABG x 2 09/22/2010  . S/P thoracic aortic aneurysm repair 09/22/2010    Past Surgical History:  Procedure Laterality Date  . AORTIC ROOT REPLACEMENT  09/22/2010    Median sternotomy for valve-sparing aortic root replacemnt Shanon Brow I reimplanation technique) with resection of engrafting of the ascending thoracic aorta .Partial circulatoru arrest, coronaru aartery bypass grafting X2 (left intrnal mammary artery to distal left anterior descending coronary artery, saphenous vein graft to posterrior descending coronary artery, EVH rt thigh  . CORONARY ARTERY BYPASS GRAFT  09/22/2010   CABGx2 with LIMA to LAD, SVG to PDA  . EXTERNAL EAR SURGERY    . HAND SURGERY    . KNEE DEBRIDEMENT     02/2008 CHONDROPLASTY/DEB. OF MENISCUS  . PILONIDAL CYST EXCISION    . PROSTATE SURGERY  09/30/10   BX  2010 NEG    Current Outpatient Rx  . Order #: [id number] Class: Normal  . Order #: [id number] Class: No Print  . Order #: [id number] Class: Normal  . Order #: [id number] Class: Print  . Order #: [id number] Class: Normal  . Order #: [id number] Class: Print  . Order #: [id number] Class: Normal  . Order #: [id number] Class: Print    Allergies Lisinopril and Toprol xl [metoprolol succinate]  Family History  Problem Relation Age of Onset  . Heart disease Father   . Cancer Brother        skin    Social History Social History   Tobacco Use  . Smoking status: Former Smoker    Last attempt to quit: 02/16/1995  Years since quitting: 22.4  . Smokeless tobacco: Former Network engineer Use Topics  . Alcohol use: Yes    Alcohol/week: 0.0 oz  . Drug use: No    Review of Systems  Constitutional: No fever/chills Eyes: No visual changes. Positive eye swelling.  ENT: No sore throat. Cardiovascular: Denies chest pain. Respiratory: Denies shortness of breath. Gastrointestinal: No abdominal pain.  No nausea, no vomiting.  No diarrhea.  No constipation. Genitourinary: Negative for dysuria. Musculoskeletal: Negative for back pain. Skin: Positive hives.  Neurological: Negative for headaches, focal weakness or numbness.  10-point ROS otherwise  negative.  ____________________________________________   PHYSICAL EXAM:  VITAL SIGNS: ED Triage Vitals  Enc Vitals Group     BP 07/24/17 2126 (!) 131/93     Pulse Rate 07/24/17 2126 100     Resp 07/24/17 2126 18     Temp 07/24/17 2126 98.3 F (36.8 C)     Temp Source 07/24/17 2126 Oral     SpO2 07/24/17 2126 100 %     Weight 07/24/17 2241 (!) 320 lb (145.2 kg)     Height 07/24/17 2241 6\' 3"  (1.905 m)     Pain Score 07/24/17 2136 5   Constitutional: Alert and oriented. Well appearing and in no acute distress. Eyes: Conjunctivae are mildly injected. No drainage. Mild periorbital edema. EOMI. PERRL.  Head: Atraumatic. Nose: No congestion/rhinnorhea. Mouth/Throat: Mucous membranes are moist.   Neck: No stridor.   Cardiovascular: Normal rate, regular rhythm. Good peripheral circulation. Grossly normal heart sounds.   Respiratory: Normal respiratory effort.  No retractions. Lungs CTAB. Gastrointestinal: Soft and nontender. No distention.  Musculoskeletal: No lower extremity tenderness nor edema. No gross deformities of extremities. Neurologic:  Normal speech and language. No gross focal neurologic deficits are appreciated.  Skin:  Skin is warm, dry and intact.  ____________________________________________   PROCEDURES  Procedure(s) performed:   Procedures  None ____________________________________________   INITIAL IMPRESSION / ASSESSMENT AND PLAN / ED COURSE  Pertinent labs & imaging results that were available during my care of the patient were reviewed by me and considered in my medical decision making (see chart for details).  Patient presents to the ED with mild left periorbital eye edema without concern for corneal abrasion and hives after presumed bee sting. No SOB, throat tightness, or tongue swelling. Sting happened approximately 10 hours removed from the event. Given steroid and Pepcid in the ED. Observed briefly in the ED with no worsening of symptoms.  Provided Epipen Rx, steroid burst, and Pepcid. Continue Benadryl PRN.   At this time, I do not feel there is any life-threatening condition present. I have reviewed and discussed all results (EKG, imaging, lab, urine as appropriate), exam findings with patient. I have reviewed nursing notes and appropriate previous records.  I feel the patient is safe to be discharged home without further emergent workup. Discussed usual and customary return precautions. Patient and family (if present) verbalize understanding and are comfortable with this plan.  Patient will follow-up with their primary care provider. If they do not have a primary care provider, information for follow-up has been provided to them. All questions have been answered.    ____________________________________________  FINAL CLINICAL IMPRESSION(S) / ED DIAGNOSES  Final diagnoses:  Insect bite of left eyelid, initial encounter  Hives     MEDICATIONS GIVEN DURING THIS VISIT:  Medications  predniSONE (DELTASONE) tablet 40 mg (40 mg Oral Given 07/24/17 2239)  famotidine (PEPCID) tablet 40 mg (40 mg Oral Given 07/24/17 2239)  NEW OUTPATIENT MEDICATIONS STARTED DURING THIS VISIT:  Discharge Medication List as of 07/24/2017 11:18 PM    START taking these medications   Details  EPINEPHrine (EPIPEN 2-PAK) 0.3 mg/0.3 mL IJ SOAJ injection Inject 0.3 mLs (0.3 mg total) into the muscle once for 1 dose., Starting Sun 07/24/2017, Print    famotidine (PEPCID) 40 MG tablet Take 1 tablet (40 mg total) by mouth daily for 7 days., Starting Sun 07/24/2017, Until Sun 07/31/2017, Print    predniSONE (DELTASONE) 20 MG tablet Take 2 tablets (40 mg total) by mouth daily for 4 days., Starting Mon 07/25/2017, Until Fri 07/29/2017, Print        Note:  This document was prepared using Dragon voice recognition software and may include unintentional dictation errors.  Nanda Quinton, MD Emergency Medicine    Brayden Brodhead, [facility], MD 07/25/17 1045

## 2017-08-02 DIAGNOSIS — T25222D Burn of second degree of left foot, subsequent encounter: Secondary | ICD-10-CM | POA: Diagnosis not present

## 2017-10-15 DIAGNOSIS — R040 Epistaxis: Secondary | ICD-10-CM | POA: Diagnosis not present

## 2017-10-20 DIAGNOSIS — R040 Epistaxis: Secondary | ICD-10-CM | POA: Diagnosis not present

## 2017-10-20 DIAGNOSIS — J329 Chronic sinusitis, unspecified: Secondary | ICD-10-CM | POA: Diagnosis not present

## 2017-10-28 DIAGNOSIS — L0202 Furuncle of face: Secondary | ICD-10-CM | POA: Diagnosis not present

## 2017-10-28 DIAGNOSIS — D225 Melanocytic nevi of trunk: Secondary | ICD-10-CM | POA: Diagnosis not present

## 2017-10-28 DIAGNOSIS — B9689 Other specified bacterial agents as the cause of diseases classified elsewhere: Secondary | ICD-10-CM | POA: Diagnosis not present

## 2017-11-04 DIAGNOSIS — E781 Pure hyperglyceridemia: Secondary | ICD-10-CM | POA: Diagnosis not present

## 2017-11-04 DIAGNOSIS — I2510 Atherosclerotic heart disease of native coronary artery without angina pectoris: Secondary | ICD-10-CM | POA: Diagnosis not present

## 2017-11-04 DIAGNOSIS — R7301 Impaired fasting glucose: Secondary | ICD-10-CM | POA: Diagnosis not present

## 2017-11-04 DIAGNOSIS — I10 Essential (primary) hypertension: Secondary | ICD-10-CM | POA: Diagnosis not present

## 2018-02-03 DIAGNOSIS — N401 Enlarged prostate with lower urinary tract symptoms: Secondary | ICD-10-CM | POA: Diagnosis not present

## 2018-02-03 DIAGNOSIS — R972 Elevated prostate specific antigen [PSA]: Secondary | ICD-10-CM | POA: Diagnosis not present

## 2018-02-28 DIAGNOSIS — N401 Enlarged prostate with lower urinary tract symptoms: Secondary | ICD-10-CM | POA: Diagnosis not present

## 2018-03-08 DIAGNOSIS — N401 Enlarged prostate with lower urinary tract symptoms: Secondary | ICD-10-CM | POA: Diagnosis not present

## 2018-03-11 ENCOUNTER — Other Ambulatory Visit: Payer: Self-pay | Admitting: Interventional Cardiology

## 2018-04-05 DIAGNOSIS — H906 Mixed conductive and sensorineural hearing loss, bilateral: Secondary | ICD-10-CM | POA: Diagnosis not present

## 2018-04-05 DIAGNOSIS — H6522 Chronic serous otitis media, left ear: Secondary | ICD-10-CM | POA: Diagnosis not present

## 2018-04-05 DIAGNOSIS — H7201 Central perforation of tympanic membrane, right ear: Secondary | ICD-10-CM | POA: Diagnosis not present

## 2018-04-27 DIAGNOSIS — H7203 Central perforation of tympanic membrane, bilateral: Secondary | ICD-10-CM | POA: Diagnosis not present

## 2018-04-27 DIAGNOSIS — H7413 Adhesive middle ear disease, bilateral: Secondary | ICD-10-CM | POA: Diagnosis not present

## 2018-04-27 DIAGNOSIS — H906 Mixed conductive and sensorineural hearing loss, bilateral: Secondary | ICD-10-CM | POA: Diagnosis not present

## 2018-05-04 ENCOUNTER — Encounter: Payer: Self-pay | Admitting: Interventional Cardiology

## 2018-05-09 ENCOUNTER — Telehealth: Payer: Self-pay | Admitting: Interventional Cardiology

## 2018-05-09 DIAGNOSIS — R05 Cough: Secondary | ICD-10-CM | POA: Diagnosis not present

## 2018-05-09 NOTE — Telephone Encounter (Signed)
Called pt about appt scheduled for 05/15/2018.  Left message to call back.

## 2018-05-11 NOTE — Telephone Encounter (Signed)
Left message to call back  

## 2018-05-11 NOTE — Telephone Encounter (Signed)
Pt called back returning your call °

## 2018-05-12 MED ORDER — CARVEDILOL 6.25 MG PO TABS: 6.2500 mg | ORAL_TABLET | Freq: Two times a day (BID) | ORAL | 5 refills | Status: DC

## 2018-05-12 MED ORDER — ROSUVASTATIN CALCIUM 5 MG PO TABS: 5.0000 mg | ORAL_TABLET | ORAL | 5 refills | Status: DC

## 2018-05-12 MED ORDER — LOSARTAN POTASSIUM-HCTZ 100-25 MG PO TABS: 1.0000 | ORAL_TABLET | Freq: Every day | ORAL | 5 refills | Status: DC

## 2018-05-12 MED ORDER — AMLODIPINE BESYLATE 5 MG PO TABS: 5.0000 mg | ORAL_TABLET | Freq: Every day | ORAL | 5 refills | Status: DC

## 2018-05-12 NOTE — Telephone Encounter (Signed)
Spoke with pt and he denies any cardiac issues.  Rescheduled pt to be 09/06/2018.  Advised to contact office sooner if any issues.

## 2018-05-12 NOTE — Telephone Encounter (Signed)
Left message to call back  

## 2018-05-12 NOTE — Telephone Encounter (Signed)
Pt called back returning your call °

## 2018-05-15 ENCOUNTER — Ambulatory Visit: Payer: 59 | Admitting: Interventional Cardiology

## 2018-08-09 ENCOUNTER — Telehealth: Payer: Self-pay | Admitting: Interventional Cardiology

## 2018-08-09 NOTE — Progress Notes (Signed)
Cardiology Office Note:    Date:  08/10/2018   ID:  Jacob Small, DOB 08-Dec-1955, MRN [id number]  PCP:  Gaynelle Arabian, MD  Cardiologist:  Sinclair Grooms, MD   Referring MD: Gaynelle Arabian, MD   Chief Complaint  Patient presents with  . Cardiac Valve Problem  . Coronary Artery Disease    History of Present Illness:    Jacob Small is a 63 y.o. male with a hx of essential hypertension, hyperlipidemia, prior myocardial infarction, morbid obesity, bilateral hearing loss, bicuspid aortic valve, status post aneurysm repair and valve sparing aortic root replacement 2012, CAD with CABG (LIMA to LAD and SVG to PDA) 2012.  There are no cardiac complaints.  He is gaining weight.  He is becoming more sedentary.  He has not had syncope, spontaneous tachycardia or palpitation.  He has mild bilateral lower extremity swelling.  He denies angina pectoris.  He has had no neurological complaints.  No bleeding on the current medical regimen.  The medical regimen includes 81 mg aspirin per day.  Past Medical History:  Diagnosis Date  . Aortic aneurysm, thoracic ([place])   . Bilateral hearing loss   . CAD (coronary artery disease)   . CAD (coronary artery disease), native coronary artery    Aortic Root Replacement and CABGx2 on 09/22/2010  Dr. Roxy Manns  . HTN (hypertension)   . Hyperlipidemia   . Myocardial infarction ([place])   . Obesity, morbid ([place])   . S/P CABG x 2 09/22/2010   LIMA to LAD, SVG to PDA  . S/P thoracic aortic aneurysm repair 09/22/2010   Valve-sparing aortic root replacement ([place] Type I)    Past Surgical History:  Procedure Laterality Date  . AORTIC ROOT REPLACEMENT  09/22/2010   Median sternotomy for valve-sparing aortic root replacemnt Shanon Brow I reimplanation technique) with resection of engrafting of the ascending thoracic aorta .Partial circulatoru arrest, coronaru aartery bypass grafting X2 (left intrnal mammary artery to distal left anterior descending coronary artery,  saphenous vein graft to posterrior descending coronary artery, EVH rt thigh  . CORONARY ARTERY BYPASS GRAFT  09/22/2010   CABGx2 with LIMA to LAD, SVG to PDA  . EXTERNAL EAR SURGERY    . HAND SURGERY    . KNEE DEBRIDEMENT     02/2008 CHONDROPLASTY/DEB. OF MENISCUS  . PILONIDAL CYST EXCISION    . PROSTATE SURGERY  09/30/10   BX  2010 NEG    Current Medications: Current Meds  Medication Sig  . amLODipine (NORVASC) 5 MG tablet Take 1 tablet (5 mg total) by mouth daily.  Marland Kitchen aspirin EC 81 MG tablet Take 1 tablet (81 mg total) by mouth daily.  . carvedilol (COREG) 6.25 MG tablet Take 1 tablet (6.25 mg total) by mouth 2 (two) times daily with a meal.  . famotidine (PEPCID) 40 MG tablet Take 1 tablet (40 mg total) by mouth daily for 7 days.  Marland Kitchen losartan-hydrochlorothiazide (HYZAAR) 100-25 MG tablet Take 1 tablet by mouth daily.  . rosuvastatin (CRESTOR) 5 MG tablet Take 1 tablet (5 mg total) by mouth 3 (three) times a week.  . sulfamethoxazole-trimethoprim (BACTRIM DS,SEPTRA DS) 800-160 MG tablet Take 1 tablet by mouth 2 (two) times daily.  . tamsulosin (FLOMAX) 0.4 MG CAPS capsule Take 0.4 mg by mouth daily.     Allergies:   Lisinopril and Toprol xl [metoprolol succinate]   Social History   Socioeconomic History  . Marital status: Married    Spouse name: Not on file  .  Number of children: Not on file  . Years of education: Not on file  . Highest education level: Not on file  Occupational History  . Not on file  Social Needs  . Financial resource strain: Not on file  . Food insecurity    Worry: Not on file    Inability: Not on file  . Transportation needs    Medical: Not on file    Non-medical: Not on file  Tobacco Use  . Smoking status: Former Smoker    Quit date: 02/16/1995    Years since quitting: 23.4  . Smokeless tobacco: Former Network engineer and Sexual Activity  . Alcohol use: Yes    Alcohol/week: 0.0 standard drinks  . Drug use: No  . Sexual activity: Not on file   Lifestyle  . Physical activity    Days per week: Not on file    Minutes per session: Not on file  . Stress: Not on file  Relationships  . Social Herbalist on phone: Not on file    Gets together: Not on file    Attends religious service: Not on file    Active member of club or organization: Not on file    Attends meetings of clubs or organizations: Not on file    Relationship status: Not on file  Other Topics Concern  . Not on file  Social History Narrative   Married ,lives in [place]     Family History: The patient's family history includes Cancer in his brother; Heart disease in his father.  ROS:   Please see the history of present illness.    Obesity, lower extremity swelling, noncompliance with CPAP.  All other systems reviewed and are negative.  EKGs/Labs/Other Studies Reviewed:    The following studies were reviewed today: Most recent 2D Doppler echocardiogram was 04/22/2016: Study Conclusions  - Procedure narrative: Transthoracic echocardiography. Image   quality was suboptimal. The study was technically difficult.   Intravenous contrast (Definity) was administered. - Left ventricle: The cavity size was normal. Wall thickness was   increased in a pattern of mild LVH. Systolic function was normal.   The estimated ejection fraction was in the range of 60% to 65%.   Doppler parameters are consistent with abnormal left ventricular   relaxation (grade 1 diastolic dysfunction). - Aortic valve: There was trivial regurgitation. - Aorta: Aortic root is normal at 39 mm. - Left atrium: The atrium was mildly dilated.  EKG:  EKG sinus rhythm/bradycardia, first-degree AV block, QS pattern V1 through V3, low voltage raising the question of amyloid.  Recent Labs: No results found for requested labs within last 8760 hours.  Recent Lipid Panel No results found for: CHOL, TRIG, HDL, CHOLHDL, VLDL, LDLCALC, LDLDIRECT  Physical Exam:    VS:  BP 138/76   Pulse  (!) 53   Ht 6\' 3"  (1.905 m)   Wt (!) 325 lb 12.8 oz (147.8 kg)   SpO2 97%   BMI 40.72 kg/m     Wt Readings from Last 3 Encounters:  08/10/18 (!) 325 lb 12.8 oz (147.8 kg)  07/24/17 (!) 320 lb (145.2 kg)  04/20/17 (!) 324 lb 12.8 oz (147.3 kg)     GEN: Obese. No acute distress HEENT: Normal NECK: No JVD. LYMPHATICS: No lymphadenopathy CARDIAC: RRR.  2/6 to 3/6 crescendo decrescendo systolic and no diastolic murmur, S4 gallop, trace to 1+ bilateral ankle edema VASCULAR: Posterior tibial 2+ and radial 2+ pulses, no bruits RESPIRATORY:  Clear to  auscultation without rales, wheezing or rhonchi  ABDOMEN: Soft, non-tender, non-distended, No pulsatile mass, MUSCULOSKELETAL: No deformity  SKIN: Warm and dry NEUROLOGIC:  Alert and oriented x 3 PSYCHIATRIC:  Normal affect   ASSESSMENT:    1. Bicuspid aortic valve   2. Coronary artery disease involving coronary bypass graft of native heart with angina pectoris ([place])   3. Mixed hyperlipidemia   4. Essential hypertension   5. Left ventricular hypertrophy   6. Educated About Covid-19 Virus Infection    PLAN:    In order of problems listed above:  1. 2D Doppler echocardiogram to assess the bicuspid aortic valve and rule out progressive aortic stenosis. 2. Secondary prevention discussed in detail as noted below. 3. LDL is at target. 4. Blood pressure target 130/80 mmHg.  Salt restriction reiterated. 5. Mild left ventricular hypertrophy with low voltage on EKG raises question of amyloid.  The next echo will reassess LV thickness.  If any significant increase compared to 3 years ago, will consider a PYP scan.  Overall education and awareness concerning primary/secondary risk prevention was discussed in detail: LDL less than 70, hemoglobin A1c less than 7, blood pressure target less than 130/80 mmHg, >150 minutes of moderate aerobic activity per week, avoidance of smoking, weight control (via diet and exercise), and continued  surveillance/management of/for obstructive sleep apnea.    Medication Adjustments/Labs and Tests Ordered: Current medicines are reviewed at length with the patient today.  Concerns regarding medicines are outlined above.  Orders Placed This Encounter  Procedures  . HgB A1c  . Basic metabolic panel  . Hepatic function panel  . CBC  . Lipoprotein A (LPA)  . Lipid panel  . EKG 12-Lead  . ECHOCARDIOGRAM COMPLETE   No orders of the defined types were placed in this encounter.   Patient Instructions  Medication Instructions:  Your physician recommends that you continue on your current medications as directed. Please refer to the Current Medication list given to you today.  If you need a refill on your cardiac medications before your next appointment, please call your pharmacy.   Lab work: BMET, Liver, Lipid, CBC, LPA, A1C today If you have labs (blood work) drawn today and your tests are completely normal, you will receive your results only by: Marland Kitchen MyChart Message (if you have MyChart) OR . A paper copy in the mail If you have any lab test that is abnormal or we need to change your treatment, we will call you to review the results.  Testing/Procedures: Your physician has requested that you have an echocardiogram prior to your follow up next year. Echocardiography is a painless test that uses sound waves to create images of your heart. It provides your doctor with information about the size and shape of your heart and how well your heart's chambers and valves are working. This procedure takes approximately one hour. There are no restrictions for this procedure.    Follow-Up: At [facility], you and your health needs are our priority.  As part of our continuing mission to provide you with exceptional heart care, we have created designated Provider Care Teams.  These Care Teams include your primary Cardiologist (physician) and Advanced Practice Providers (APPs -  Physician Assistants  and Nurse Practitioners) who all work together to provide you with the care you need, when you need it. You will need a follow up appointment in 12 months.  Please call our office 2 months in advance to schedule this appointment.  You may see Mallie Mussel  Carlye Grippe, MD or one of the following Advanced Practice Providers on your designated Care Team:   Truitt Merle, NP Cecilie Kicks, NP . Kathyrn Drown, NP  Any Other Special Instructions Will Be Listed Below (If Applicable).       Signed, Sinclair Grooms, MD  08/10/2018 9:20 AM    [place]

## 2018-08-09 NOTE — Telephone Encounter (Signed)
New Message     COVID SCREENING QUESTIONS FOR IN-OFFICE VISITS - PLEASE DOCUMENT PATIENT ANSWERS  1. Do you currently have a fever?  a. No   2. Have you recently traveled on a cruise, internationally, or to [place], [place], [place], [place], [place], or [place], [place] [employer])?  a. No   3. Have you been in contact with someone that is currently pending confirmation of Covid19 testing or has been confirmed to have the [place] virus?  a. No  4. Are you currently experiencing fatigue or cough?  a. No

## 2018-08-10 ENCOUNTER — Encounter (INDEPENDENT_AMBULATORY_CARE_PROVIDER_SITE_OTHER): Payer: Self-pay

## 2018-08-10 ENCOUNTER — Encounter: Payer: Self-pay | Admitting: Interventional Cardiology

## 2018-08-10 ENCOUNTER — Ambulatory Visit: Payer: 59 | Admitting: Interventional Cardiology

## 2018-08-10 ENCOUNTER — Other Ambulatory Visit: Payer: Self-pay

## 2018-08-10 VITALS — BP 138/76 | HR 53 | Ht 75.0 in | Wt 325.8 lb

## 2018-08-10 DIAGNOSIS — I517 Cardiomegaly: Secondary | ICD-10-CM

## 2018-08-10 DIAGNOSIS — I25709 Atherosclerosis of coronary artery bypass graft(s), unspecified, with unspecified angina pectoris: Secondary | ICD-10-CM

## 2018-08-10 DIAGNOSIS — I10 Essential (primary) hypertension: Secondary | ICD-10-CM

## 2018-08-10 DIAGNOSIS — E782 Mixed hyperlipidemia: Secondary | ICD-10-CM | POA: Diagnosis not present

## 2018-08-10 DIAGNOSIS — Z7189 Other specified counseling: Secondary | ICD-10-CM

## 2018-08-10 DIAGNOSIS — Q231 Congenital insufficiency of aortic valve: Secondary | ICD-10-CM

## 2018-08-10 NOTE — Patient Instructions (Signed)
Medication Instructions:  Your physician recommends that you continue on your current medications as directed. Please refer to the Current Medication list given to you today.  If you need a refill on your cardiac medications before your next appointment, please call your pharmacy.   Lab work: BMET, Liver, Lipid, CBC, LPA, A1C today If you have labs (blood work) drawn today and your tests are completely normal, you will receive your results only by: Marland Kitchen MyChart Message (if you have MyChart) OR . A paper copy in the mail If you have any lab test that is abnormal or we need to change your treatment, we will call you to review the results.  Testing/Procedures: Your physician has requested that you have an echocardiogram prior to your follow up next year. Echocardiography is a painless test that uses sound waves to create images of your heart. It provides your doctor with information about the size and shape of your heart and how well your heart's chambers and valves are working. This procedure takes approximately one hour. There are no restrictions for this procedure.    Follow-Up: At [facility], you and your health needs are our priority.  As part of our continuing mission to provide you with exceptional heart care, we have created designated Provider Care Teams.  These Care Teams include your primary Cardiologist (physician) and Advanced Practice Providers (APPs -  Physician Assistants and Nurse Practitioners) who all work together to provide you with the care you need, when you need it. You will need a follow up appointment in 12 months.  Please call our office 2 months in advance to schedule this appointment.  You may see Sinclair Grooms, MD or one of the following Advanced Practice Providers on your designated Care Team:   Truitt Merle, NP Cecilie Kicks, NP . Kathyrn Drown, NP  Any Other Special Instructions Will Be Listed Below (If Applicable).

## 2018-08-11 LAB — HEMOGLOBIN A1C
Est. average glucose Bld gHb Est-mCnc: 105 mg/dL
Hgb A1c MFr Bld: 5.3 % (ref 4.8–5.6)

## 2018-08-11 LAB — CBC
Hematocrit: 42.8 % (ref 37.5–51.0)
Hemoglobin: 14.4 g/dL (ref 13.0–17.7)
MCH: 28.2 pg (ref 26.6–33.0)
MCHC: 33.6 g/dL (ref 31.5–35.7)
MCV: 84 fL (ref 79–97)
Platelets: 174 10*3/uL (ref 150–450)
RBC: 5.11 x10E6/uL (ref 4.14–5.80)
RDW: 13.2 % (ref 11.6–15.4)
WBC: 6.4 10*3/uL (ref 3.4–10.8)

## 2018-08-11 LAB — BASIC METABOLIC PANEL
BUN/Creatinine Ratio: 17 (ref 10–24)
BUN: 17 mg/dL (ref 8–27)
CO2: 25 mmol/L (ref 20–29)
Calcium: 9.5 mg/dL (ref 8.6–10.2)
Chloride: 103 mmol/L (ref 96–106)
Creatinine, Ser: 1 mg/dL (ref 0.76–1.27)
GFR calc Af Amer: 92 mL/min/{1.73_m2} (ref >59)
GFR calc non Af Amer: 80 mL/min/{1.73_m2} (ref >59)
Glucose: 102 mg/dL — ABNORMAL HIGH (ref 65–99)
Potassium: 4.2 mmol/L (ref 3.5–5.2)
Sodium: 139 mmol/L (ref 134–144)

## 2018-08-11 LAB — HEPATIC FUNCTION PANEL
ALT: 17 IU/L (ref 0–44)
AST: 21 IU/L (ref 0–40)
Albumin: 4.5 g/dL (ref 3.8–4.8)
Alkaline Phosphatase: 76 IU/L (ref 39–117)
Bilirubin Total: 1.7 mg/dL — ABNORMAL HIGH (ref 0.0–1.2)
Bilirubin, Direct: 0.3 mg/dL (ref 0.00–0.40)
Total Protein: 6.5 g/dL (ref 6.0–8.5)

## 2018-08-11 LAB — LIPID PANEL
Chol/HDL Ratio: 4.7 ratio (ref 0.0–5.0)
Cholesterol, Total: 108 mg/dL (ref 100–199)
HDL: 23 mg/dL — ABNORMAL LOW (ref >39)
LDL Calculated: 61 mg/dL (ref 0–99)
Triglycerides: 118 mg/dL (ref 0–149)
VLDL Cholesterol Cal: 24 mg/dL (ref 5–40)

## 2018-08-11 LAB — LIPOPROTEIN A (LPA): Lipoprotein (a): 23.8 nmol/L (ref <75.0)

## 2018-09-06 ENCOUNTER — Ambulatory Visit: Payer: 59 | Admitting: Interventional Cardiology

## 2018-11-12 ENCOUNTER — Other Ambulatory Visit: Payer: Self-pay | Admitting: Interventional Cardiology

## 2019-02-13 ENCOUNTER — Other Ambulatory Visit: Payer: Self-pay | Admitting: Interventional Cardiology
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
## 2019-02-26 ENCOUNTER — Encounter: Payer: Self-pay | Admitting: Urology

## 2019-02-26 ENCOUNTER — Encounter: Payer: Self-pay | Admitting: Interventional Cardiology

## 2019-02-26 ENCOUNTER — Encounter
Admission: RE | Admit: 2019-02-26 | Discharge: 2019-02-26 | Disposition: A | Discharge status: Home / Self Care | Payer: 59 | Source: Ambulatory Visit | Attending: Urology | Admitting: Urology

## 2019-02-26 ENCOUNTER — Other Ambulatory Visit: Payer: Self-pay

## 2019-02-26 NOTE — Patient Instructions (Signed)
Your EKG & COVID test are scheduled on: Tuesday 02/27/19 @ 8:00am.  Enter through the [place] entrance.  Your procedure is scheduled on: Thursday 03/01/19 Report to Same Day Surgery 2nd floor [facility] Entrance-take elevator on left to 2nd floor.  Check in with surgery information desk.) To find out your arrival time, call [phone] 1:00-3:00 PM on Wednesday 02/28/19  Remember: Instructions that are not followed completely may result in serious medical risk, up to and including death, or upon the discretion of your surgeon and anesthesiologist your surgery may need to be rescheduled.    __x__ 1. Do not eat food (including mints, candies, chewing gum) after midnight the night before your procedure. You may drink water up to 2 hours before you are scheduled to arrive at the hospital for your procedure.  Do not drink anything within 2 hours of your scheduled arrival to the hospital.    __x__ 2. No Alcohol or smoking for 24 hours before or after surgery.   __x__ 3. Notify your doctor if there is any change in your medical condition (cold, fever, infections).   __x__ 4. On the morning of surgery brush your teeth with toothpaste and water.  You may rinse your mouth with mouthwash if you wish.  Do not swallow any toothpaste or mouthwash.   __x__ 5. Use antibacterial soap, if available, such as Dial to shower/bathe on the day of surgery.  Please read over the following fact sheets that you were given: MRSA Information    Do not wear jewelry, lotions, powders, deodorant, or perfumes on the day of surgery.  Do not shave below the neckline for 48 hours prior to surgery.   Do not bring valuables to the hospital.  [facility] is not responsible for any belongings or valuables.               Hearing aids, dentures or bridgework may not be worn into surgery.  For patients discharged on the day of surgery, you will NOT be permitted to drive yourself home.  You must have a responsible  adult with you for 24 hours after surgery.  __x__ Take these medicines on the morning of surgery with a SMALL SIP OF WATER:  1. Carvedilol (Coreg)  2. Amlodipine (Norvasc)  3. Rosuvastatin  __x__ Skip your Losartan-Hydrochlorothiazide (Hyzaar) ONLY on the morning of your surgery.  You do NOT need to stop taking it ahead of time.  __x__ If you are on any blood thinners, follow recommendations from Cardiologist, Pulmonologist or PCP regarding stopping Aspirin, Coumadin, Plavix, Eliquis, Effient, Pradaxa, and Pletal.  __x__ STARTING TODAY UNTIL AFTER SURGERY: Do not take any Anti-inflammatories such as Advil, Ibuprofen, Motrin, Aleve, Naproxen, Naprosyn, BC/Goodies powders or aspirin products. You have already stopped taking your low dose aspirin per Dr. Letta Kocher office.  Do not take any over the counter herbal/nutritional supplements.

## 2019-02-26 NOTE — Patient Instructions (Signed)
Your COVID test, EKG, and blood work are scheduled on: Tuesday 02/27/19 @ 8:00.  Enter in through the [place] entrance.  Your procedure is scheduled on: Thursday 03/01/19 Report to Same Day Surgery 2nd floor [facility] Entrance-take elevator on left to 2nd floor.  Check in with surgery information desk.) To find out your arrival time, call [phone] 1:00-3:00 PM on Wednesday 02/28/19  Remember: Instructions that are not followed completely may result in serious medical risk, up to and including death, or upon the discretion of your surgeon and anesthesiologist your surgery may need to be rescheduled.    __x__ 1. Do not eat food (including mints, candies, chewing gum) after midnight the night before your procedure. You may drink water up to 2 hours before you are scheduled to arrive at the hospital for your procedure.  Do not drink anything within 2 hours of your scheduled arrival to the hospital.    __x__ 2. No Alcohol or smoking for 24 hours before or after surgery.   __x__ 3. Notify your doctor if there is any change in your medical condition (cold, fever, infections).   __x__ 4. On the morning of surgery brush your teeth with toothpaste and water.  You may rinse your mouth with mouthwash if you wish.  Do not swallow any toothpaste or mouthwash.  Please read over the following fact sheets that you were given:   [facility] Preparing for Surgery and/or MRSA Information    __x__ If available, use antibacterial soap such as Dial to shower/bathe on the day of surgery.   Do not wear jewelry, lotions, powders, deodorant or perfumes on the day of surgery.  Do not shave below your neckline for 48 hours prior to surgery.   Do not bring valuables to the hospital.  [facility] is not responsible for any belongings or valuables.               Hearing aids, dentures or bridgework may not be worn into surgery.  For patients discharged on the day of surgery, you will NOT be  permitted to drive yourself home.  You must have a responsible adult with you for 24 hours after surgery.  __x__ Take these medicines on the morning of surgery with a SMALL SIP OF WATER:  1. Carvedilol (Coreg)  2. Amlodipine (Norvasc)  3. Rosuvastatin  __x__ Skip your Losartan-Hydrochlorothiazide (Hyzaar) ONLY on the morning of surgery.  You do NOT need to stop taking it ahead of time.  __x__ Follow recommendations from Cardiologist, Pulmonologist or PCP regarding stopping Aspirin, Coumadin, Plavix, Eliquis, Effient, Pradaxa, and Pletal.  __x__ STARTING TODAY: Do not take any Anti-inflammatories such as Advil, Ibuprofen, Motrin, Aleve, Naproxen, Naprosyn, BC/Goodies powders or aspirin products. You have already stopped taking your low dose aspirin, per Dr. Letta Kocher instructions.   __x__ STARTING TODAY: Do not take any over the counter herbal/nutritional supplements until after surgery.

## 2019-02-27 ENCOUNTER — Other Ambulatory Visit: Admission: RE | Admit: 2019-02-27 | Payer: 59 | Source: Ambulatory Visit

## 2019-02-27 ENCOUNTER — Encounter
Admission: RE | Admit: 2019-02-27 | Discharge: 2019-02-27 | Disposition: A | Discharge status: Home / Self Care | Payer: 59 | Source: Ambulatory Visit | Attending: Urology | Admitting: Urology

## 2019-02-27 DIAGNOSIS — I2510 Atherosclerotic heart disease of native coronary artery without angina pectoris: Secondary | ICD-10-CM | POA: Insufficient documentation

## 2019-02-27 DIAGNOSIS — N400 Enlarged prostate without lower urinary tract symptoms: Secondary | ICD-10-CM | POA: Diagnosis not present

## 2019-02-27 DIAGNOSIS — R9431 Abnormal electrocardiogram [ECG] [EKG]: Secondary | ICD-10-CM | POA: Insufficient documentation

## 2019-02-27 DIAGNOSIS — I10 Essential (primary) hypertension: Secondary | ICD-10-CM | POA: Insufficient documentation

## 2019-02-27 DIAGNOSIS — Z20822 Contact with and (suspected) exposure to covid-19: Secondary | ICD-10-CM | POA: Diagnosis not present

## 2019-02-27 DIAGNOSIS — Z01818 Encounter for other preprocedural examination: Secondary | ICD-10-CM | POA: Insufficient documentation

## 2019-02-27 LAB — SARS CORONAVIRUS 2 (TAT 6-24 HRS): SARS Coronavirus 2: NEGATIVE

## 2019-02-27 LAB — BASIC METABOLIC PANEL
Anion gap: 9 (ref 5–15)
BUN: 19 mg/dL (ref 8–23)
CO2: 29 mmol/L (ref 22–32)
Calcium: 9.4 mg/dL (ref 8.9–10.3)
Chloride: 102 mmol/L (ref 98–111)
Creatinine, Ser: 0.99 mg/dL (ref 0.61–1.24)
GFR calc Af Amer: >60 mL/min (ref >60)
GFR calc non Af Amer: >60 mL/min (ref >60)
Glucose, Bld: 108 mg/dL — ABNORMAL HIGH (ref 70–99)
Potassium: 3.8 mmol/L (ref 3.5–5.1)
Sodium: 140 mmol/L (ref 135–145)

## 2019-02-27 LAB — CBC
HCT: 42.3 % (ref 39.0–52.0)
Hemoglobin: 14.1 g/dL (ref 13.0–17.0)
MCH: 27.8 pg (ref 26.0–34.0)
MCHC: 33.3 g/dL (ref 30.0–36.0)
MCV: 83.3 fL (ref 80.0–100.0)
Platelets: 198 10*3/uL (ref 150–400)
RBC: 5.08 MIL/uL (ref 4.22–5.81)
RDW: 13.2 % (ref 11.5–15.5)
WBC: 7.8 10*3/uL (ref 4.0–10.5)
nRBC: 0 % (ref 0.0–0.2)

## 2019-02-27 NOTE — Pre-Procedure Instructions (Signed)
EKG sent to anesthesiologist ([place]) for review    " looks unchanged from previous"

## 2019-03-01 ENCOUNTER — Ambulatory Visit: Payer: 59 | Admitting: Anesthesiology

## 2019-03-01 ENCOUNTER — Ambulatory Visit
Admission: RE | Admit: 2019-03-01 | Discharge: 2019-03-01 | Disposition: A | Discharge status: Home / Self Care | Payer: 59 | Source: Ambulatory Visit | Attending: Urology | Admitting: Urology

## 2019-03-01 ENCOUNTER — Encounter
Admission: RE | Disposition: A | Discharge status: Home / Self Care | Payer: Self-pay | Source: Ambulatory Visit | Attending: Urology

## 2019-03-01 ENCOUNTER — Encounter: Payer: Self-pay | Admitting: Urology

## 2019-03-01 ENCOUNTER — Other Ambulatory Visit: Payer: Self-pay

## 2019-03-01 DIAGNOSIS — K5790 Diverticulosis of intestine, part unspecified, without perforation or abscess without bleeding: Secondary | ICD-10-CM | POA: Diagnosis not present

## 2019-03-01 DIAGNOSIS — H9193 Unspecified hearing loss, bilateral: Secondary | ICD-10-CM | POA: Insufficient documentation

## 2019-03-01 DIAGNOSIS — Z9889 Other specified postprocedural states: Secondary | ICD-10-CM | POA: Insufficient documentation

## 2019-03-01 DIAGNOSIS — Z951 Presence of aortocoronary bypass graft: Secondary | ICD-10-CM | POA: Diagnosis not present

## 2019-03-01 DIAGNOSIS — Z7982 Long term (current) use of aspirin: Secondary | ICD-10-CM | POA: Diagnosis not present

## 2019-03-01 DIAGNOSIS — N320 Bladder-neck obstruction: Secondary | ICD-10-CM | POA: Diagnosis not present

## 2019-03-01 DIAGNOSIS — I252 Old myocardial infarction: Secondary | ICD-10-CM | POA: Diagnosis not present

## 2019-03-01 DIAGNOSIS — E785 Hyperlipidemia, unspecified: Secondary | ICD-10-CM | POA: Insufficient documentation

## 2019-03-01 DIAGNOSIS — N401 Enlarged prostate with lower urinary tract symptoms: Secondary | ICD-10-CM | POA: Diagnosis present

## 2019-03-01 DIAGNOSIS — I10 Essential (primary) hypertension: Secondary | ICD-10-CM | POA: Insufficient documentation

## 2019-03-01 DIAGNOSIS — Z87891 Personal history of nicotine dependence: Secondary | ICD-10-CM | POA: Diagnosis not present

## 2019-03-01 DIAGNOSIS — R001 Bradycardia, unspecified: Secondary | ICD-10-CM | POA: Insufficient documentation

## 2019-03-01 DIAGNOSIS — I2510 Atherosclerotic heart disease of native coronary artery without angina pectoris: Secondary | ICD-10-CM | POA: Insufficient documentation

## 2019-03-01 DIAGNOSIS — N138 Other obstructive and reflux uropathy: Secondary | ICD-10-CM | POA: Diagnosis not present

## 2019-03-01 DIAGNOSIS — Z6841 Body Mass Index (BMI) 40.0 and over, adult: Secondary | ICD-10-CM | POA: Diagnosis not present

## 2019-03-01 DIAGNOSIS — Z79899 Other long term (current) drug therapy: Secondary | ICD-10-CM | POA: Insufficient documentation

## 2019-03-01 DIAGNOSIS — I440 Atrioventricular block, first degree: Secondary | ICD-10-CM | POA: Diagnosis not present

## 2019-03-01 DIAGNOSIS — I4891 Unspecified atrial fibrillation: Secondary | ICD-10-CM | POA: Diagnosis not present

## 2019-03-01 HISTORY — PX: CYSTOSCOPY WITH INSERTION OF UROLIFT: SHX6678

## 2019-03-01 HISTORY — DX: Atherosclerotic heart disease of native coronary artery without angina pectoris: I25.10

## 2019-03-01 HISTORY — DX: Essential (primary) hypertension: I10

## 2019-03-01 SURGERY — CYSTOSCOPY WITH INSERTION OF UROLIFT
Anesthesia: General

## 2019-03-01 MED ORDER — CEFAZOLIN SODIUM-DEXTROSE 1-4 GM/50ML-% IV SOLN
1.0000 g | Freq: Once | INTRAVENOUS | Status: AC
  Administered 2019-03-01: 2 g via INTRAVENOUS

## 2019-03-01 MED ORDER — URIBEL 118 MG PO CAPS: 1.0000 | ORAL_CAPSULE | Freq: Four times a day (QID) | ORAL | 3 refills | Status: DC | PRN

## 2019-03-01 MED ORDER — FAMOTIDINE 20 MG PO TABS: 20.0000 mg | ORAL_TABLET | Freq: Once | ORAL | Status: AC

## 2019-03-01 MED ORDER — FAMOTIDINE 20 MG PO TABS
ORAL_TABLET | ORAL | Status: AC
  Administered 2019-03-01: 09:00:00 20 mg via ORAL
  Filled 2019-03-01: qty 1

## 2019-03-01 MED ORDER — LIDOCAINE HCL URETHRAL/MUCOSAL 2 % EX GEL
CUTANEOUS | Status: DC | PRN
  Administered 2019-03-01: 1

## 2019-03-01 MED ORDER — ONDANSETRON HCL 4 MG/2ML IJ SOLN: 4.0000 mg | Freq: Once | INTRAMUSCULAR | Status: DC | PRN

## 2019-03-01 MED ORDER — ACETAMINOPHEN 10 MG/ML IV SOLN: 1000.0000 mg | Freq: Once | INTRAVENOUS | Status: DC | PRN

## 2019-03-01 MED ORDER — OXYCODONE HCL 5 MG PO TABS: 5.0000 mg | ORAL_TABLET | Freq: Once | ORAL | Status: DC | PRN

## 2019-03-01 MED ORDER — KETOROLAC TROMETHAMINE 30 MG/ML IJ SOLN
INTRAMUSCULAR | Status: DC | PRN
  Administered 2019-03-01: 30 mg via INTRAVENOUS

## 2019-03-01 MED ORDER — DEXAMETHASONE SODIUM PHOSPHATE 10 MG/ML IJ SOLN
INTRAMUSCULAR | Status: DC | PRN
  Administered 2019-03-01: 10 mg via INTRAVENOUS

## 2019-03-01 MED ORDER — GENTAMICIN SULFATE 40 MG/ML IJ SOLN
INTRAMUSCULAR | Status: AC
  Filled 2019-03-01: qty 2

## 2019-03-01 MED ORDER — LIDOCAINE HCL (CARDIAC) PF 100 MG/5ML IV SOSY
PREFILLED_SYRINGE | INTRAVENOUS | Status: DC | PRN
  Administered 2019-03-01: 100 mg via INTRAVENOUS

## 2019-03-01 MED ORDER — FENTANYL CITRATE (PF) 100 MCG/2ML IJ SOLN
INTRAMUSCULAR | Status: DC | PRN
  Administered 2019-03-01 (×2): 50 ug via INTRAVENOUS

## 2019-03-01 MED ORDER — EPHEDRINE SULFATE 50 MG/ML IJ SOLN
INTRAMUSCULAR | Status: DC | PRN
  Administered 2019-03-01: 25 mg via INTRAVENOUS
  Administered 2019-03-01 (×2): 10 mg via INTRAVENOUS

## 2019-03-01 MED ORDER — CIPROFLOXACIN HCL 500 MG PO TABS: 500.0000 mg | ORAL_TABLET | Freq: Two times a day (BID) | ORAL | 0 refills | Status: DC

## 2019-03-01 MED ORDER — LACTATED RINGERS IV SOLN: INTRAVENOUS | Status: DC

## 2019-03-01 MED ORDER — FENTANYL CITRATE (PF) 100 MCG/2ML IJ SOLN: 25.0000 ug | INTRAMUSCULAR | Status: DC | PRN

## 2019-03-01 MED ORDER — ONDANSETRON HCL 4 MG/2ML IJ SOLN
INTRAMUSCULAR | Status: DC | PRN
  Administered 2019-03-01: 4 mg via INTRAVENOUS

## 2019-03-01 MED ORDER — CEFAZOLIN SODIUM-DEXTROSE 1-4 GM/50ML-% IV SOLN
INTRAVENOUS | Status: AC
  Filled 2019-03-01: qty 50

## 2019-03-01 MED ORDER — SUCCINYLCHOLINE CHLORIDE 20 MG/ML IJ SOLN
INTRAMUSCULAR | Status: DC | PRN
  Administered 2019-03-01: 150 mg via INTRAVENOUS

## 2019-03-01 MED ORDER — LIDOCAINE HCL URETHRAL/MUCOSAL 2 % EX GEL
CUTANEOUS | Status: AC
  Filled 2019-03-01: qty 10

## 2019-03-01 MED ORDER — PROPOFOL 10 MG/ML IV BOLUS
INTRAVENOUS | Status: DC | PRN
  Administered 2019-03-01: 200 mg via INTRAVENOUS

## 2019-03-01 MED ORDER — OXYCODONE HCL 5 MG/5ML PO SOLN: 5.0000 mg | Freq: Once | ORAL | Status: DC | PRN

## 2019-03-01 MED ORDER — GENTAMICIN SULFATE 40 MG/ML IJ SOLN
INTRAMUSCULAR | Status: DC | PRN
  Administered 2019-03-01: 80 mg

## 2019-03-01 SURGICAL SUPPLY — 19 items
BAG DRAIN CYSTO-URO LG1000N (MISCELLANEOUS) ×2 IMPLANT
COVER WAND RF STERILE (DRAPES) ×2 IMPLANT
FIBER LASER FLEXIVA 550 (UROLOGICAL SUPPLIES) IMPLANT
GLOVE BIO SURGEON STRL SZ7.5 (GLOVE) ×2 IMPLANT
GOWN STRL REUS W/ TWL LRG LVL3 (GOWN DISPOSABLE) ×1 IMPLANT
GOWN STRL REUS W/ TWL XL LVL3 (GOWN DISPOSABLE) ×1 IMPLANT
GOWN STRL REUS W/TWL LRG LVL3 (GOWN DISPOSABLE) ×2
GOWN STRL REUS W/TWL XL LVL3 (GOWN DISPOSABLE) ×2
KIT TURNOVER CYSTO (KITS) ×2 IMPLANT
NDL SAFETY ECLIPSE 18X1.5 (NEEDLE) IMPLANT
NEEDLE HYPO 18GX1.5 SHARP (NEEDLE) ×2
PACK CYSTO AR (MISCELLANEOUS) ×2 IMPLANT
SET CYSTO W/LG BORE CLAMP LF (SET/KITS/TRAYS/PACK) ×2 IMPLANT
SURGILUBE 2OZ TUBE FLIPTOP (MISCELLANEOUS) ×2 IMPLANT
SYR 3ML LL SCALE MARK (SYRINGE) ×1 IMPLANT
SYR 50ML LL SCALE MARK (SYRINGE) ×1 IMPLANT
SYSTEM UROLIFT (Male Continence) ×8 IMPLANT
WATER STERILE IRR 1000ML POUR (IV SOLUTION) ×2 IMPLANT
WATER STERILE IRR 3000ML UROMA (IV SOLUTION) ×2 IMPLANT

## 2019-03-01 NOTE — Op Note (Signed)
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 Yves Dill MD  Anesthesia: General  Indications:See the history and physical. After informed consent the above procedure(s) were requested     Technique and findings: After adequate general anesthesia been obtained the patient was placed into dorsal lithotomy position and the perineum was prepped and draped in usual fashion.  Patient had a greater than 1000 cc left hydrocele which did not allow adequate passage of the cystoscope due to angulation.  Therefore, the hydrocele was drained with an 39 [place] Angiocath and approximately 1000 cc of slightly milky fluid obtained.  The hydrocele cavity was then instilled with 80 mg of gentamicin.  At this point the 21 French cystoscope was easily passed into the bladder.  Bladder was mildly trabeculated.  Both ureteral orifices were identified and had clear efflux.  No bladder tumors were identified.  The prostate was obstructing with lateral lobe hypertrophy and a prostatic urethral length of 4 cm.  Median lobe was not present.  At this point the first pair of UroLift implants were placed  1.5 cm still to the bladder neck at the 10 and 2:00 positions.  The next pair of implants were placed at the level of the very montanum in the same positions.  Cystoscopy was performed indicating that there was still obstructive tissue present between the initial sets of implants.  A third and fourth set of implants were stacked between the initial sets of implants.  Cystoscopy indicated a widely patent prostatic urethra  withno significant bleeding was noted.  The bladder was filled with 200 cc and the cystoscope removed.  10 cc of viscous Xylocaine was instilled within the urethra.  The procedure was terminated and patient transferred to the recovery room in stable condition.

## 2019-03-01 NOTE — Anesthesia Postprocedure Evaluation (Signed)
Anesthesia Post Note  Patient: Jacob Small  Procedure(s) Performed: CYSTOSCOPY WITH INSERTION OF UROLIFT (N/A )  Patient location during evaluation: PACU Anesthesia Type: General Level of consciousness: awake and alert Pain management: pain level controlled Vital Signs Assessment: post-procedure vital signs reviewed and stable Respiratory status: spontaneous breathing, nonlabored ventilation, respiratory function stable and patient connected to nasal cannula oxygen Cardiovascular status: blood pressure returned to baseline and stable Postop Assessment: no apparent nausea or vomiting Anesthetic complications: no     Last Vitals:  Vitals:   03/01/19 1105 03/01/19 1126  BP: 129/62 (!) 138/54  Pulse: 71 72  Resp: 16 18  Temp: 36.4 C 36.6 C  SpO2: 97% 98%    Last Pain:  Vitals:   03/01/19 1126  TempSrc: Temporal  PainSc: 0-No pain                 Arita Miss

## 2019-03-01 NOTE — Transfer of Care (Signed)
Immediate Anesthesia Transfer of Care Note  Patient: Jacob Small  Procedure(s) Performed: CYSTOSCOPY WITH INSERTION OF UROLIFT (N/A )  Patient Location: PACU  Anesthesia Type:General  Level of Consciousness: awake and drowsy  Airway & Oxygen Therapy: Patient connected to face mask oxygen  Post-op Assessment: Report given to RN and Post -op Vital signs reviewed and stable  Post vital signs: Reviewed and stable  Last Vitals:  Vitals Value Taken Time  BP 153/97 03/01/19 1017  Temp    Pulse 73 03/01/19 1021  Resp 18 03/01/19 1021  SpO2 98 % 03/01/19 1021  Vitals shown include unvalidated device data.  Last Pain:  Vitals:   03/01/19 0815  TempSrc: Tympanic  PainSc: 0-No pain         Complications: No apparent anesthesia complications

## 2019-03-01 NOTE — Progress Notes (Signed)
Scant amt of meatal bleeding noted

## 2019-03-01 NOTE — Anesthesia Procedure Notes (Signed)
Procedure Name: Intubation Date/Time: 03/01/2019 8:58 AM Performed by: Debe Coder, CRNA Pre-anesthesia Checklist: Patient identified, Emergency Drugs available, Suction available and Patient being monitored Patient Re-evaluated:Patient Re-evaluated prior to induction Oxygen Delivery Method: Circle system utilized Preoxygenation: Pre-oxygenation with 100% oxygen Induction Type: IV induction and Rapid sequence Laryngoscope Size: Mac and 4 Grade View: Grade II Tube type: Oral Tube size: 7.5 mm Number of attempts: 1 Airway Equipment and Method: Stylet and Oral airway (Anterior pressure) Placement Confirmation: ETT inserted through vocal cords under direct vision,  positive ETCO2 and breath sounds checked- equal and bilateral Secured at: 23 cm Tube secured with: Tape Dental Injury: Teeth and Oropharynx as per pre-operative assessment

## 2019-03-01 NOTE — H&P (Signed)
Date of Initial H&P: 02/14/19  History reviewed, patient examined, no change in status, stable for surgery. 

## 2019-03-01 NOTE — Anesthesia Preprocedure Evaluation (Addendum)
Anesthesia Evaluation  Patient identified by MRN, date of birth, ID band Patient awake    Reviewed: Allergy & Precautions, NPO status , Patient's Chart, lab work & pertinent test results  History of Anesthesia Complications Negative for: history of anesthetic complications  Airway Mallampati: III  TM Distance: >3 FB Neck ROM: Full   Comment: Large neck Dental no notable dental hx. (+) Teeth Intact, Partial Upper, Dental Advisory Given   Pulmonary neg pulmonary ROS, neg sleep apnea, neg COPD, Patient abstained from smoking.Not current smoker, former smoker,  Does snore at night   Pulmonary exam normal breath sounds clear to auscultation       Cardiovascular Exercise Tolerance: Poor METShypertension, Pt. on medications + CAD and + Past MI  (-) dysrhythmias  Rhythm:Regular Rate:Normal - Systolic murmurs 2D Doppler echocardiogram 04/22/2016: Study Conclusions - Left ventricle: The cavity size was normal. Wall thickness was increased in a pattern of mild LVH. Systolic function was normal. The estimated ejection fraction was in the range of 60% to 65%. Doppler parameters are consistent with abnormal left ventricular relaxation (grade 1 diastolic dysfunction). - Aortic valve: There was trivial regurgitation. - Aorta: Aortic root is normal at 39 mm. - Left atrium: The atrium was mildly dilated.  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)  Reproductive/Obstetrics                            Anesthesia Physical Anesthesia Plan  ASA: III  Anesthesia Plan: General   Post-op Pain Management:    Induction: Intravenous  PONV Risk Score and Plan: 3 and Ondansetron and Dexamethasone  Airway Management Planned: Oral ETT  Additional Equipment: None  Intra-op Plan:   Post-operative Plan: Extubation in OR  Informed Consent: I have reviewed the patients History and Physical, chart, labs and discussed the procedure including the risks, benefits and alternatives for the proposed anesthesia with the patient or authorized representative who has indicated his/her understanding and acceptance.     Dental advisory given  Plan Discussed with: CRNA and Surgeon  Anesthesia Plan Comments: (Patient noted that during a previous colonoscopy he was told his "tongue fell back in his mouth and they needed to put a tube in".  Discussed risks of anesthesia with patient, including PONV, sore throat, lip/dental damage. Rare risks discussed as well, such as cardiorespiratory sequelae. Patient understands.  Patient informed about  increased incidence of above perioperative risk due to high BMI. Patient understands. )        Anesthesia Quick Evaluation

## 2019-03-01 NOTE — Discharge Instructions (Addendum)
AMBULATORY SURGERY  DISCHARGE INSTRUCTIONS   1) The drugs that you were given will stay in your system until tomorrow so for the next 24 hours you should not:  A) Drive an automobile B) Make any legal decisions C) Drink any alcoholic beverage   2) You may resume regular meals tomorrow.  Today it is better to start with liquids and gradually work up to solid foods.  You may eat anything you prefer, but it is better to start with liquids, then soup and crackers, and gradually work up to solid foods.   3) Please notify your doctor immediately if you have any unusual bleeding, trouble breathing, redness and pain at the surgery site, drainage, fever, or pain not relieved by medication.    4) Additional Instructions:        Please contact your physician with any problems or Same Day Surgery at [phone], Monday through Friday 6 am to 4 pm, or [place] at [facility] number at [phone].Benign Prostatic Hyperplasia  Benign prostatic hyperplasia (BPH) is an enlarged prostate gland that is caused by the normal aging process and not by cancer. The prostate is a walnut-sized gland that is involved in the production of semen. It is located in front of the rectum and below the bladder. The bladder stores urine and the urethra is the tube that carries the urine out of the body. The prostate may get bigger as a man gets older. An enlarged prostate can press on the urethra. This can make it harder to pass urine. The build-up of urine in the bladder can cause infection. Back pressure and infection may progress to bladder damage and kidney (renal) failure. What are the causes? This condition is part of a normal aging process. However, not all men develop problems from this condition. If the prostate enlarges away from the urethra, urine flow will not be blocked. If it enlarges toward the urethra and compresses it, there will be problems passing urine. What increases the risk? This  condition is more likely to develop in men over the age of 77 years. What are the signs or symptoms? Symptoms of this condition include:  Getting up often during the night to urinate.  Needing to urinate frequently during the day.  Difficulty starting urine flow.  Decrease in size and strength of your urine stream.  Leaking (dribbling) after urinating.  Inability to pass urine. This needs immediate treatment.  Inability to completely empty your bladder.  Pain when you pass urine. This is more common if there is also an infection.  Urinary tract infection (UTI). How is this diagnosed? This condition is diagnosed based on your medical history, a physical exam, and your symptoms. Tests will also be done, such as:  A post-void bladder scan. This measures any amount of urine that may remain in your bladder after you finish urinating.  A digital rectal exam. In a rectal exam, your health care provider checks your prostate by putting a lubricated, gloved finger into your rectum to feel the back of your prostate gland. This exam detects the size of your gland and any abnormal lumps or growths.  An exam of your urine (urinalysis).  A prostate specific antigen (PSA) screening. This is a blood test used to screen for prostate cancer.  An ultrasound. This test uses sound waves to electronically produce a picture of your prostate gland. Your health care provider may refer you to a specialist in kidney and prostate diseases (urologist). How is this treated? Once  symptoms begin, your health care provider will monitor your condition (active surveillance or watchful waiting). Treatment for this condition will depend on the severity of your condition. Treatment may include:  Observation and yearly exams. This may be the only treatment needed if your condition and symptoms are mild.  Medicines to relieve your symptoms, including: ? Medicines to shrink the prostate. ? Medicines to relax the  muscle of the prostate.  Surgery in severe cases. Surgery may include: ? Prostatectomy. In this procedure, the prostate tissue is removed completely through an open incision or with a laparoscope or robotics. ? Transurethral resection of the prostate (TURP). In this procedure, a tool is inserted through the opening at the tip of the penis (urethra). It is used to cut away tissue of the inner core of the prostate. The pieces are removed through the same opening of the penis. This removes the blockage. ? Transurethral incision (TUIP). In this procedure, small cuts are made in the prostate. This lessens the prostate's pressure on the urethra. ? Transurethral microwave thermotherapy (TUMT). This procedure uses microwaves to create heat. The heat destroys and removes a small amount of prostate tissue. ? Transurethral needle ablation (TUNA). This procedure uses radio frequencies to destroy and remove a small amount of prostate tissue. ? Interstitial laser coagulation ([place]). This procedure uses a laser to destroy and remove a small amount of prostate tissue. ? Transurethral electrovaporization (TUVP). This procedure uses electrodes to destroy and remove a small amount of prostate tissue. ? Prostatic urethral lift. This procedure inserts an implant to push the lobes of the prostate away from the urethra. Follow these instructions at home:  Take over-the-counter and prescription medicines only as told by your health care provider.  Monitor your symptoms for any changes. Contact your health care provider with any changes.  Avoid drinking large amounts of liquid before going to bed or out in public.  Avoid or reduce how much caffeine or alcohol you drink.  Give yourself time when you urinate.  Keep all follow-up visits as told by your health care provider. This is important. Contact a health care provider if:  You have unexplained back pain.  Your symptoms do not get better with treatment.  You  develop side effects from the medicine you are taking.  Your urine becomes very dark or has a bad smell.  Your lower abdomen becomes distended and you have trouble passing your urine. Get help right away if:  You have a fever or chills.  You suddenly cannot urinate.  You feel lightheaded, or very dizzy, or you faint.  There are large amounts of blood or clots in the urine.  Your urinary problems become hard to manage.  You develop moderate to severe low back or flank pain. The flank is the side of your body between the ribs and the hip. These symptoms may represent a serious problem that is an emergency. Do not wait to see if the symptoms will go away. Get medical help right away. Call your local emergency services (911 in the [place] Do not drive yourself to the hospital. Summary  Benign prostatic hyperplasia (BPH) is an enlarged prostate that is caused by the normal aging process and not by cancer.  An enlarged prostate can press on the urethra. This can make it hard to pass urine.  This condition is part of a normal aging process and is more likely to develop in men over the age of 107 years.  Get help right away if  you suddenly cannot urinate. This information is not intended to replace advice given to you by your health care provider. Make sure you discuss any questions you have with your health care provider. Document Revised: 12/27/2017 Document Reviewed: 03/08/2016 Elsevier Patient Education  2020 [place]   5) The drugs that you were given will stay in your system until tomorrow so for the next 24 hours you should not:  D) Drive an automobile E) Make any legal decisions F) Drink any alcoholic beverage   6) You may resume regular meals tomorrow.  Today it is better to start with liquids and gradually work up to solid foods.  You may eat anything you prefer, but it is better to start with liquids, then soup and  crackers, and gradually work up to solid foods.   7) Please notify your doctor immediately if you have any unusual bleeding, trouble breathing, redness and pain at the surgery site, drainage, fever, or pain not relieved by medication.    8) Additional Instructions:        Please contact your physician with any problems or Same Day Surgery at [phone], Monday through Friday 6 am to 4 pm, or [place] at [facility] number at [phone].

## 2019-05-16 ENCOUNTER — Other Ambulatory Visit: Payer: Self-pay | Admitting: Interventional Cardiology

## 2019-05-21 ENCOUNTER — Other Ambulatory Visit: Payer: Self-pay | Admitting: Interventional Cardiology

## 2019-08-18 ENCOUNTER — Other Ambulatory Visit: Payer: Self-pay | Admitting: Interventional Cardiology

## 2019-08-27 ENCOUNTER — Ambulatory Visit (HOSPITAL_COMMUNITY): Payer: 59 | Attending: Cardiology

## 2019-08-27 ENCOUNTER — Other Ambulatory Visit: Payer: Self-pay

## 2019-08-27 DIAGNOSIS — Q231 Congenital insufficiency of aortic valve: Secondary | ICD-10-CM

## 2019-08-27 NOTE — Progress Notes (Signed)
Cardiology Office Note:    Date:  08/29/2019   ID:  Jacob Small, DOB August 20, 1955, MRN [id number]  PCP:  Jacob Arabian, MD  Cardiologist:  Jacob Grooms, MD   Referring MD: Jacob Arabian, MD   Chief Complaint  Patient presents with  . Coronary Artery Disease    History of Present Illness:    Jacob Small is a 64 y.o. male with a hx of essential hypertension, hyperlipidemia, prior myocardial infarction, morbid obesity, bilateral hearing loss, bicuspid aortic valve, status post aneurysm repair and valve sparing aortic root replacement 2012, CAD with CABG (LIMA to LAD and SVG to PDA) 2012.   Jacob Small is doing well and has no specific cardiac complaints.  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  This requires his attention and much work.  He has not had chest discomfort, excessive dyspnea, orthopnea, palpitations, or PND.  Past Medical History:  Diagnosis Date  . Aortic aneurysm, thoracic ([place])   . Bilateral hearing loss   . CAD (coronary artery disease)   . CAD (coronary artery disease), native coronary artery    Aortic Root Replacement and CABGx2 on 09/22/2010  Dr. Roxy Small  . Coronary artery disease   . HTN (hypertension)   . Hyperlipidemia   . Hypertension   . Myocardial infarction ([place])   . Obesity, morbid ([place])   . S/P CABG x 2 09/22/2010   LIMA to LAD, SVG to PDA  . S/P thoracic aortic aneurysm repair 09/22/2010   Valve-sparing aortic root replacement ([place] Type I)    Past Surgical History:  Procedure Laterality Date  . AORTIC ROOT REPLACEMENT  09/22/2010   Median sternotomy for valve-sparing aortic root replacemnt Jacob Small I reimplanation technique) with resection of engrafting of the ascending thoracic aorta .Partial circulatoru arrest, coronaru aartery bypass grafting X2 (left intrnal mammary artery to distal left anterior descending coronary artery, saphenous vein graft to posterrior descending coronary artery, EVH  rt thigh  . CORONARY ARTERY BYPASS GRAFT  09/22/2010   CABGx2 with LIMA to LAD, SVG to PDA  . CORONARY ARTERY BYPASS GRAFT    . CYSTOSCOPY WITH INSERTION OF UROLIFT N/A 03/01/2019   Procedure: CYSTOSCOPY WITH INSERTION OF UROLIFT;  Surgeon: Royston Cowper, MD;  Location: [facility] ORS;  Service: Urology;  Laterality: N/A;  . EXTERNAL EAR SURGERY    . HAND SURGERY    . KNEE DEBRIDEMENT     02/2008 CHONDROPLASTY/DEB. OF MENISCUS  . PILONIDAL CYST EXCISION    . PROSTATE SURGERY  09/30/10   BX  2010 NEG    Current Medications: Current Meds  Medication Sig  . amLODipine (NORVASC) 5 MG tablet TAKE 1 TABLET BY MOUTH EVERY DAY  . aspirin EC 81 MG tablet Take 1 tablet (81 mg total) by mouth daily.  . carvedilol (COREG) 6.25 MG tablet Take 1 tablet (6.25 mg total) by mouth 2 (two) times daily with a meal.  . losartan-hydrochlorothiazide (HYZAAR) 100-25 MG tablet Take 1 tablet by mouth daily.  . rosuvastatin (CRESTOR) 5 MG tablet Take 1 tablet by mouth three times a week  . [DISCONTINUED] carvedilol (COREG) 6.25 MG tablet TAKE 1 TABLET (6.25 MG TOTAL) BY MOUTH 2 (TWO) TIMES DAILY WITH A MEAL.  . [DISCONTINUED] losartan-hydrochlorothiazide (HYZAAR) 100-25 MG tablet TAKE 1 TABLET BY MOUTH EVERY DAY  . [DISCONTINUED] rosuvastatin (CRESTOR) 5 MG tablet Take 1 tablet by mouth three times a week     Allergies:  Lisinopril, Toprol xl [metoprolol succinate], Lisinopril, Metoprolol, and Other   Social History   Socioeconomic History  . Marital status: Married    Spouse name: Not on file  . Number of children: Not on file  . Years of education: Not on file  . Highest education level: Not on file  Occupational History  . Not on file  Tobacco Use  . Smoking status: Former Smoker    Quit date: 02/16/1995    Years since quitting: 24.5  . Smokeless tobacco: Never Used  Vaping Use  . Vaping Use: Never used  Substance and Sexual Activity  . Alcohol use: Not Currently  . Drug use: Never  . Sexual  activity: Not on file  Other Topics Concern  . Not on file  Social History Narrative   ** Merged History Encounter **       Married ,lives in [place] Strain:   . Difficulty of Paying Living Expenses:   Food Insecurity:   . Worried About Charity fundraiser in the Last Year:   . Arboriculturist in the Last Year:   Transportation Needs:   . Film/video editor (Medical):   Marland Kitchen Lack of Transportation (Non-Medical):   Physical Activity:   . Days of Exercise per Week:   . Minutes of Exercise per Session:   Stress:   . Feeling of Stress :   Social Connections:   . Frequency of Communication with Friends and Family:   . Frequency of Social Gatherings with Friends and Family:   . Attends Religious Services:   . Active Member of Clubs or Organizations:   . Attends Archivist Meetings:   Marland Kitchen Marital Status:      Family History: The patient's family history includes Cancer in his brother; Heart disease in his father.  ROS:   Please see the history of present illness.    Ankle edema left greater than right.  The swelling is better in a.m. and increases as the day goes along.  Prolonged sitting increases the degree of swelling.  All other systems reviewed and are negative.  EKGs/Labs/Other Studies Reviewed:    The following studies were reviewed today:  ECHOCARDIOGRAM 08/27/19: IMPRESSIONS    1. Left ventricular ejection fraction, by estimation, is 60 to 65%. The  left ventricle has normal function. The left ventricle has no regional  wall motion abnormalities. There is moderate concentric left ventricular  hypertrophy. Left ventricular  diastolic parameters are consistent with Grade II diastolic dysfunction  (pseudonormalization).  2. Right ventricular systolic function is normal. The right ventricular  size is normal.  3. Left atrial size was moderately dilated.  4. The mitral valve is normal in  structure. No evidence of mitral valve  regurgitation. No evidence of mitral stenosis.  5. The aortic valve is normal in structure. Aortic valve regurgitation is  trivial. No aortic stenosis is present.  6. The inferior vena cava is normal in size with greater than 50%  respiratory variability, suggesting right atrial pressure of 3 mmHg.    EKG:  EKG performed 02/27/2019 demonstrates inferior Q waves, sinus bradycardia, poor R wave progression V1 through V3.  When compared to prior no changes noted.  Recent Labs: 02/27/2019: BUN 19; Creatinine, Ser 0.99; Hemoglobin 14.1; Platelets 198; Potassium 3.8; Sodium 140  Recent Lipid Panel    Component Value Date/Time   CHOL 108 08/10/2018 0908   TRIG 118 08/10/2018 0908  HDL 23 (L) 08/10/2018 0908   CHOLHDL 4.7 08/10/2018 0908   LDLCALC 61 08/10/2018 0908    Physical Exam:    VS:  BP 126/76   Pulse (!) 59   Ht 6' 1.5" (1.867 m)   Wt (!) 329 lb (149.2 kg)   SpO2 95%   BMI 42.82 kg/m     Wt Readings from Last 3 Encounters:  08/29/19 (!) 329 lb (149.2 kg)  03/01/19 (!) 328 lb 6.4 oz (149 kg)  08/10/18 (!) 325 lb 12.8 oz (147.8 kg)     GEN: Morbid obesity. No acute distress HEENT: Normal NECK: No JVD. LYMPHATICS: No lymphadenopathy CARDIAC:  RRR without murmur, gallop, or edema. VASCULAR:  Normal Pulses. No bruits. RESPIRATORY:  Clear to auscultation without rales, wheezing or rhonchi  ABDOMEN: Soft, non-tender, non-distended, No pulsatile mass, MUSCULOSKELETAL: No deformity  SKIN: Warm and dry NEUROLOGIC:  Alert and oriented x 3 PSYCHIATRIC:  Normal affect   ASSESSMENT:    1. Coronary artery disease involving coronary bypass graft of native heart with angina pectoris ([place])   2. Mixed hyperlipidemia   3. Bicuspid aortic valve   4. Essential hypertension   5. Left ventricular hypertrophy   6. S/P thoracic aortic aneurysm repair   7. Educated about COVID-19 virus infection   8. SOB (shortness of breath)    PLAN:     In order of problems listed above:  1. Secondary prevention discussed in detail emphasizing aerobic activity, weight loss, diet, and sleep. 2. Will check LDL today.  Continue/intensity.  Target less than 70. 3. No evidence of a bicuspid valve on recent echo.  This problem as it exists will be extracted from the problem list.  LDL will be obtained today. 4. Blood pressure is excellent.  Target is 130/80.  Continue amlodipine 5 mg/day, carvedilol 6.25 mg twice daily, Hyzaar 100/25 mg daily 5. Left ventricular hypertrophy is persistent despite excellent blood pressure control.  No symptoms to suggest diastolic heart failure. 6. Aorta by echo is normal sized. 7. Vaccination has occurred.  Still practicing social distancing. 8. Likely related to deconditioning.  Check BNP to remove any concern about the possibility of diastolic heart failure. 9. Left greater than right edema.  Likely a combination of worsening left leg than right.  Recommended elevation, if becomes troubling or if skin irritation consider moderate tension knee-high support stockings.  Will check BNP to make sure there is no signal of volume overload.  I believe current dyspnea is related to deconditioning.  Overall education and awareness concerning primary/secondary risk prevention was discussed in detail: LDL less than 70, hemoglobin A1c less than 7, blood pressure target less than 130/80 mmHg, >150 minutes of moderate aerobic activity per week, avoidance of smoking, weight control (via diet and exercise), and continued surveillance/management of/for obstructive sleep apnea.    Medication Adjustments/Labs and Tests Ordered: Current medicines are reviewed at length with the patient today.  Concerns regarding medicines are outlined above.  Orders Placed This Encounter  Procedures  . Basic metabolic panel  . Hepatic function panel  . Pro b natriuretic peptide  . Lipid panel   Meds ordered this encounter  Medications  .  rosuvastatin (CRESTOR) 5 MG tablet    Sig: Take 1 tablet by mouth three times a week    Dispense:  12 tablet    Refill:  11  . losartan-hydrochlorothiazide (HYZAAR) 100-25 MG tablet    Sig: Take 1 tablet by mouth daily.    Dispense:  30  tablet    Refill:  11  . carvedilol (COREG) 6.25 MG tablet    Sig: Take 1 tablet (6.25 mg total) by mouth 2 (two) times daily with a meal.    Dispense:  60 tablet    Refill:  11    Patient Instructions  Medication Instructions:  Your physician recommends that you continue on your current medications as directed. Please refer to the Current Medication list given to you today.  *If you need a refill on your cardiac medications before your next appointment, please call your pharmacy*   Lab Work: BMET, Liver, Lipid and Pro BNP today  If you have labs (blood work) drawn today and your tests are completely normal, you will receive your results only by: Marland Kitchen MyChart Message (if you have MyChart) OR . A paper copy in the mail If you have any lab test that is abnormal or we need to change your treatment, we will call you to review the results.   Testing/Procedures: None   Follow-Up: At [facility], you and your health needs are our priority.  As part of our continuing mission to provide you with exceptional heart care, we have created designated Provider Care Teams.  These Care Teams include your primary Cardiologist (physician) and Advanced Practice Providers (APPs -  Physician Assistants and Nurse Practitioners) who all work together to provide you with the care you need, when you need it.  We recommend signing up for the patient portal called "MyChart".  Sign up information is provided on this After Visit Summary.  MyChart is used to connect with patients for Virtual Visits (Telemedicine).  Patients are able to view lab/test results, encounter notes, upcoming appointments, etc.  Non-urgent messages can be sent to your provider as well.   To learn more  about what you can do with MyChart, go to [email or web address].    Your next appointment:   12 month(s)  The format for your next appointment:   In Person  Provider:   You may see Jacob Grooms, MD or one of the following Advanced Practice Providers on your designated Care Team:    Truitt Merle, NP  Cecilie Kicks, NP  Kathyrn Drown, NP    Other Instructions      Signed, Jacob Grooms, MD  08/29/2019 8:40 AM    [place]

## 2019-08-29 ENCOUNTER — Other Ambulatory Visit: Payer: Self-pay

## 2019-08-29 ENCOUNTER — Ambulatory Visit: Payer: 59 | Admitting: Interventional Cardiology

## 2019-08-29 ENCOUNTER — Encounter: Payer: Self-pay | Admitting: Interventional Cardiology

## 2019-08-29 VITALS — BP 126/76 | HR 59 | Ht 73.5 in | Wt 329.0 lb

## 2019-08-29 DIAGNOSIS — R0602 Shortness of breath: Secondary | ICD-10-CM

## 2019-08-29 DIAGNOSIS — Z8679 Personal history of other diseases of the circulatory system: Secondary | ICD-10-CM

## 2019-08-29 DIAGNOSIS — E782 Mixed hyperlipidemia: Secondary | ICD-10-CM

## 2019-08-29 DIAGNOSIS — I25709 Atherosclerosis of coronary artery bypass graft(s), unspecified, with unspecified angina pectoris: Secondary | ICD-10-CM | POA: Diagnosis not present

## 2019-08-29 DIAGNOSIS — I10 Essential (primary) hypertension: Secondary | ICD-10-CM

## 2019-08-29 DIAGNOSIS — Q231 Congenital insufficiency of aortic valve: Secondary | ICD-10-CM | POA: Diagnosis not present

## 2019-08-29 DIAGNOSIS — Z7189 Other specified counseling: Secondary | ICD-10-CM

## 2019-08-29 DIAGNOSIS — R600 Localized edema: Secondary | ICD-10-CM

## 2019-08-29 DIAGNOSIS — Z9889 Other specified postprocedural states: Secondary | ICD-10-CM

## 2019-08-29 DIAGNOSIS — I517 Cardiomegaly: Secondary | ICD-10-CM

## 2019-08-29 MED ORDER — AMLODIPINE BESYLATE 5 MG PO TABS: 5.0000 mg | ORAL_TABLET | Freq: Every day | ORAL | 3 refills | Status: DC

## 2019-08-29 MED ORDER — ROSUVASTATIN CALCIUM 5 MG PO TABS: ORAL_TABLET | ORAL | 11 refills | Status: DC

## 2019-08-29 MED ORDER — LOSARTAN POTASSIUM-HCTZ 100-25 MG PO TABS: 1.0000 | ORAL_TABLET | Freq: Every day | ORAL | 11 refills | Status: DC

## 2019-08-29 MED ORDER — CARVEDILOL 6.25 MG PO TABS: 6.2500 mg | ORAL_TABLET | Freq: Two times a day (BID) | ORAL | 11 refills | Status: DC

## 2019-08-29 NOTE — Patient Instructions (Signed)
Medication Instructions:  Your physician recommends that you continue on your current medications as directed. Please refer to the Current Medication list given to you today.  *If you need a refill on your cardiac medications before your next appointment, please call your pharmacy*   Lab Work: BMET, Liver, Lipid and Pro BNP today  If you have labs (blood work) drawn today and your tests are completely normal, you will receive your results only by: Marland Kitchen MyChart Message (if you have MyChart) OR . A paper copy in the mail If you have any lab test that is abnormal or we need to change your treatment, we will call you to review the results.   Testing/Procedures: None   Follow-Up: At [facility], you and your health needs are our priority.  As part of our continuing mission to provide you with exceptional heart care, we have created designated Provider Care Teams.  These Care Teams include your primary Cardiologist (physician) and Advanced Practice Providers (APPs -  Physician Assistants and Nurse Practitioners) who all work together to provide you with the care you need, when you need it.  We recommend signing up for the patient portal called "MyChart".  Sign up information is provided on this After Visit Summary.  MyChart is used to connect with patients for Virtual Visits (Telemedicine).  Patients are able to view lab/test results, encounter notes, upcoming appointments, etc.  Non-urgent messages can be sent to your provider as well.   To learn more about what you can do with MyChart, go to [email or web address].    Your next appointment:   12 month(s)  The format for your next appointment:   In Person  Provider:   You may see Sinclair Grooms, MD or one of the following Advanced Practice Providers on your designated Care Team:    Truitt Merle, NP  Cecilie Kicks, NP  Kathyrn Drown, NP    Other Instructions

## 2019-08-30 LAB — BASIC METABOLIC PANEL
BUN/Creatinine Ratio: 22 (ref 10–24)
BUN: 21 mg/dL (ref 8–27)
CO2: 24 mmol/L (ref 20–29)
Calcium: 9.6 mg/dL (ref 8.6–10.2)
Chloride: 102 mmol/L (ref 96–106)
Creatinine, Ser: 0.96 mg/dL (ref 0.76–1.27)
GFR calc Af Amer: 96 mL/min/{1.73_m2} (ref >59)
GFR calc non Af Amer: 83 mL/min/{1.73_m2} (ref >59)
Glucose: 109 mg/dL — ABNORMAL HIGH (ref 65–99)
Potassium: 3.8 mmol/L (ref 3.5–5.2)
Sodium: 141 mmol/L (ref 134–144)

## 2019-08-30 LAB — LIPID PANEL
Chol/HDL Ratio: 4.3 ratio (ref 0.0–5.0)
Cholesterol, Total: 104 mg/dL (ref 100–199)
HDL: 24 mg/dL — ABNORMAL LOW (ref >39)
LDL Chol Calc (NIH): 54 mg/dL (ref 0–99)
Triglycerides: 151 mg/dL — ABNORMAL HIGH (ref 0–149)
VLDL Cholesterol Cal: 26 mg/dL (ref 5–40)

## 2019-08-30 LAB — HEPATIC FUNCTION PANEL
ALT: 16 IU/L (ref 0–44)
AST: 14 IU/L (ref 0–40)
Albumin: 4.4 g/dL (ref 3.8–4.8)
Alkaline Phosphatase: 98 IU/L (ref 48–121)
Bilirubin Total: 1.6 mg/dL — ABNORMAL HIGH (ref 0.0–1.2)
Bilirubin, Direct: 0.35 mg/dL (ref 0.00–0.40)
Total Protein: 6.6 g/dL (ref 6.0–8.5)

## 2019-08-30 LAB — PRO B NATRIURETIC PEPTIDE: NT-Pro BNP: 64 pg/mL (ref 0–210)

## 2019-09-03 ENCOUNTER — Telehealth: Payer: Self-pay | Admitting: Interventional Cardiology

## 2019-09-03 NOTE — Telephone Encounter (Signed)
Informed pt of results. Pt verbalized understanding. 

## 2019-09-03 NOTE — Telephone Encounter (Signed)
Patient is returning call to discuss lab work completed on 08/29/19. Please call.

## 2019-09-22 ENCOUNTER — Other Ambulatory Visit: Payer: Self-pay | Admitting: Interventional Cardiology

## 2019-12-12 IMAGING — CT CT TEMPORAL BONES W/ CM
4 of 9 series · 10 of 30 positions shown, 11 images · IV contrast (iopamidol)
Comparison: Temporal bone CT 05/22/2010

CLINICAL DATA: Chronic right-sided ear infections. Possible
mastoiditis. History of 2 prior right-sided surgeries.

EXAM:
CT TEMPORAL BONES WITH CONTRAST
TECHNIQUE: Axial and coronal plane CT imaging of the petrous temporal bones was
performed with thin-collimation image reconstruction after
intravenous contrast administration. Multiplanar CT image
reconstructions were also generated.
CONTRAST:  75mL 10MVDZ-NWW IOPAMIDOL (10MVDZ-NWW) INJECTION 61%

[Series 2: temperal bones 0.60 hr60 s3 ax bone · axial · 0.42mm/px · z∈[-567,-545]mm · 2 of 108 slices shown]
[im 36/108  bone]
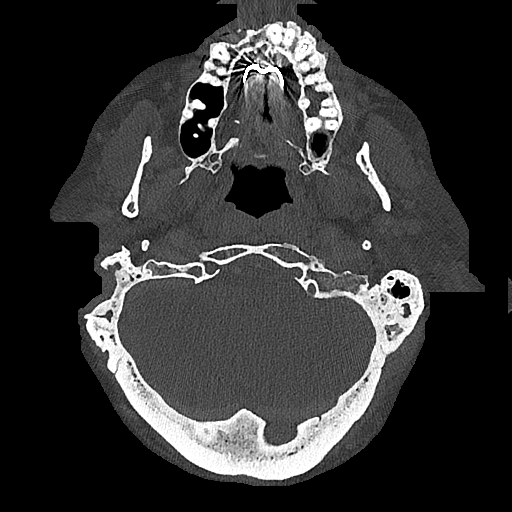
[im 72/108  bone]
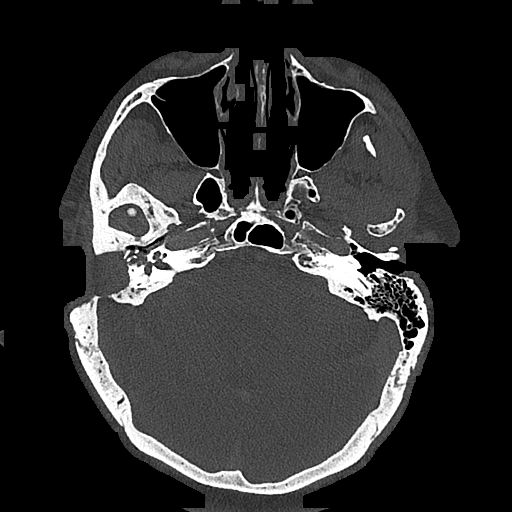

[Series 4: temperal bones 0.60 hr60 s3 cor bone · axial · 0.44mm/px · z∈[-588,-539]mm · 3 of 166 slices shown, 4 images]
[im 42/166  brain]
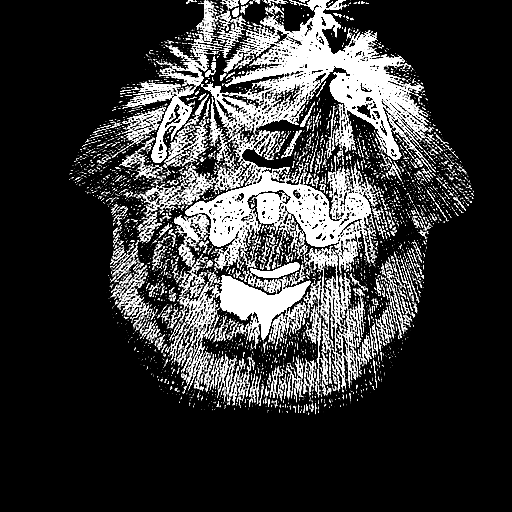
[im 42/166  bone]
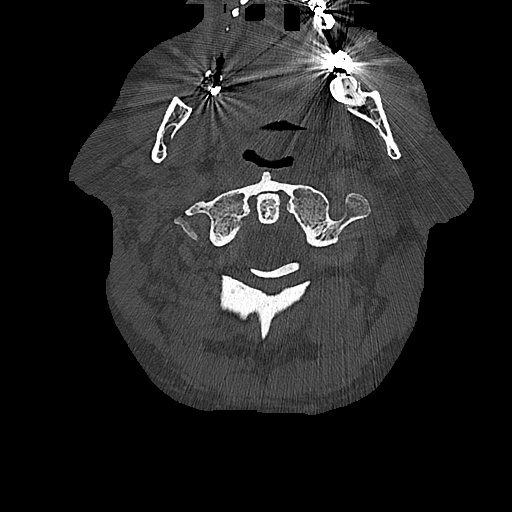
[im 83/166  bone]
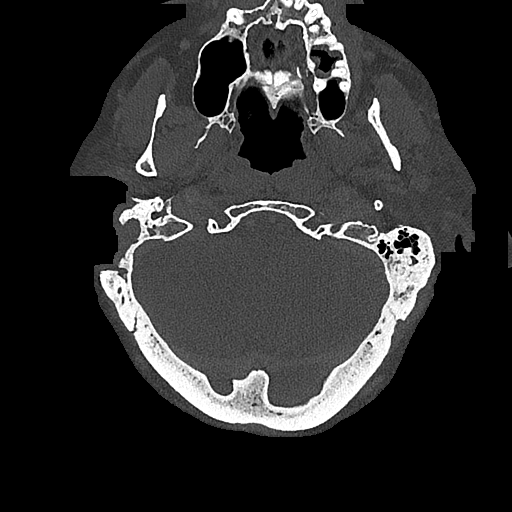
[im 124/166  bone]
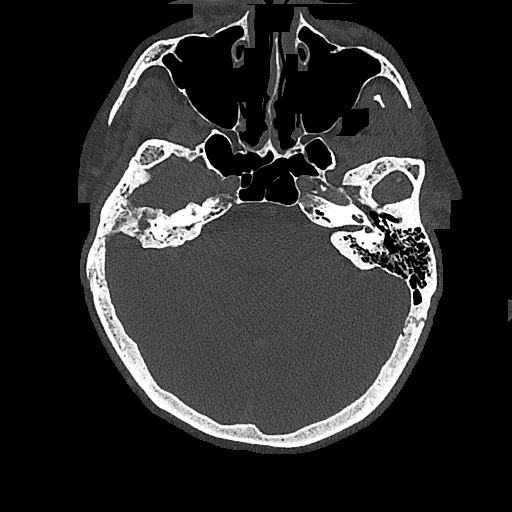

[Series 8: temperal bones 0.80 hr60 s3 coronal cor mag right · coronal · 0.20mm/px · 3 of 153 slices shown]
[im 39/153  bone]
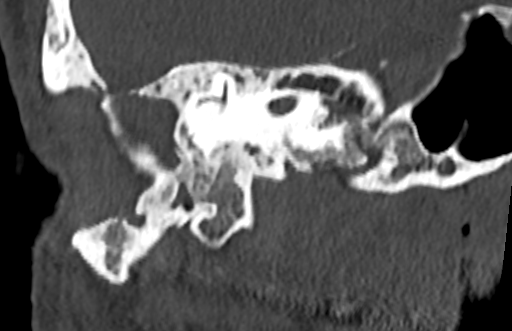
[im 77/153  bone]
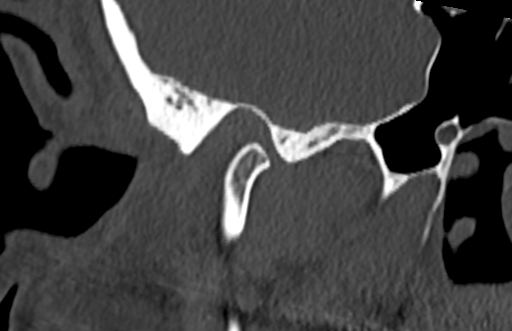
[im 115/153  bone]
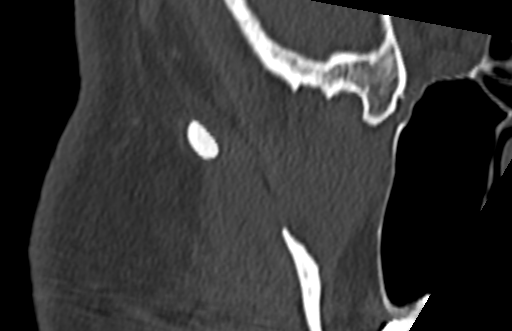

[Series 10: temperal bones 0.80 hr60 s3 coronal cor mag left · coronal · 0.18mm/px · 2 of 150 slices shown]
[im 50/150  bone]
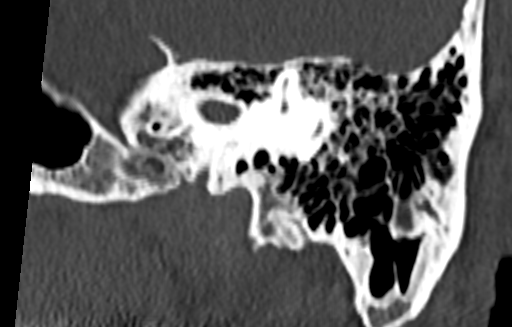
[im 100/150  bone]
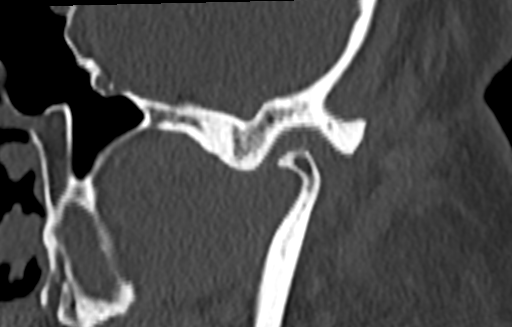

[10 of 30 positions shown; findings below may reference images not displayed]

FINDINGS: RIGHT:

--Pinna and external auditory canal: Normal.

--Ossicular chain: The ossicles are encased in intermediate
attenuation material.

--Tympanic membrane: Not visualized.

--Middle ear: There is intermediate attenuation material filling the
sinus tympani.

--Epitympanum: Postsurgical changes with resection of the lateral
wall of the epitympanum. The scutum is eroded, new from 05/22/2010.
The epitympanum is filled with intermediate attenuation material.

--Cochlea, vestibule, vestibular aqueduct and semicircular canals:
Normal. No evidence of canal dehiscence or otospongiosis.

--Internal auditory canal: Normal. No widening of the porus
acusticus.

--Facial nerve: No focal abnormality along the course of the facial
nerve.

--Cerebellopontine angle: Normal.

--Petrous Apex: Normal.

--Mastoids: Status post mastoidectomy. The remaining mastoid air
cells are opacified.

--Carotid canal: Normal position.

LEFT:

--Pinna and external auditory canal: Normal.

--Ossicular chain: Normal. No erosion or dislocation.

--Tympanic membrane: Normal.

--Middle ear: Normal.

--Epitympanum: The [place] space is clear. The scutum is sharp.
Tegmen tympani is intact.

--Cochlea, vestibule, vestibular aqueduct and semicircular canals:
Normal. No evidence of canal dehiscence or otospongiosis.

--Internal auditory canal: Normal. No widening of the porus
acusticus.

--Facial nerve: No focal abnormality along the course of the facial
nerve.

--Cerebellopontine angle: Normal.

--Petrous Apex: Normal.

--Mastoids: Minimal inferior mastoid opacification.  No coalescence

--Carotid canal: Normal position.

OTHER:

--Visualized intracranial: Normal.

--Visualized paranasal sinuses: Normal.

--Nasopharynx: Clear.

--Temporomandibular joints: Normal.

--Visualized extracranial soft tissues: Normal.
IMPRESSION: 1. Postsurgical changes of the right temporal bone with fluid or
soft tissue filling the epitympanum and sinus tympani. The remaining
mastoid air cells are opacified. Since the scan of 05/22/2010, there
has been erosion of the scutum and encasement of the ossicles by
fluid or soft tissue, which may indicate cholesteatoma.
2. Minimal opacification of the inferior air cells of the left
mastoid without coalescence. Otherwise normal left temporal bone.

## 2020-02-19 ENCOUNTER — Telehealth: Payer: Self-pay

## 2020-02-19 NOTE — Telephone Encounter (Signed)
[facility] is requesting a separate new Rx for losartan and HCTZ. The combination drug is on backorder with no release date. Would Dr. Katrinka Blazing like to prescribe medication separately? Please address

## 2020-02-20 MED ORDER — LOSARTAN POTASSIUM 100 MG PO TABS: 100.0000 mg | ORAL_TABLET | Freq: Every day | ORAL | 3 refills | Status: DC

## 2020-02-20 MED ORDER — HYDROCHLOROTHIAZIDE 25 MG PO TABS: 25.0000 mg | ORAL_TABLET | Freq: Every day | ORAL | 3 refills | Status: DC

## 2020-02-20 NOTE — Telephone Encounter (Signed)
New prescriptions sent for Losartan and HCTZ to be separated since the combo pill is on backorder.

## 2020-02-25 ENCOUNTER — Other Ambulatory Visit: Payer: Self-pay | Admitting: Interventional Cardiology

## 2020-06-19 ENCOUNTER — Telehealth: Payer: Self-pay | Admitting: Interventional Cardiology

## 2020-06-19 NOTE — Telephone Encounter (Signed)
New Message:      Pt have been suffering with a dry cough with mucous. He saud this have been going on for 2 and half weeks.. He have been 2 doctors and he have had several Covid tests, they were negative. He was told he had a dry cough and heard some rattling in his chest. He was given medicine, he still have the cough. He is now  concerned that this might be related to Congested Heart Failure.

## 2020-06-19 NOTE — Telephone Encounter (Signed)
Pt with dry cough for 2.5 weeks.  Coughs up mucous at times.  Sometimes it's green, sometimes it's clear.  Seen Dr. Alroy Dust at his PCP's office.  States he said no rattling and gave him cough syrup.  Cough gone for about 3 days and then returned when he ran out of medicine.  Went to [facility] walk in clinic Tuesday and they gave him Prednisone and and antibiotic.  Has 3 days of Prednisone and 5 days of antibiotic left. Oxygen level was normal.  All covid tests were negative.  Coughs more at night and has to sleep in a recliner.  Wife did some online research and they became concerned about HF.  Advised with mucous being green, more likely to be an infection.  Advised pt to continue Prednisone and antibiotic.  Encouraged pt to reach out to PCP office to see if they would do a chest xray if he wasn't feeling any better.  Pt agreeable.  Advised I would send to Dr. Tamala Julian to see if he has any other recommendations.

## 2020-06-20 NOTE — Telephone Encounter (Signed)
Agree with recommendation

## 2020-06-20 NOTE — Telephone Encounter (Signed)
Spoke with pt and made him aware that Dr Tamala Julian agreed with plan.  Pt will contact PCP to see about getting CXR.

## 2020-08-23 NOTE — Progress Notes (Signed)
Cardiology Office Note:    Date:  08/25/2020   ID:  Jacob Small, DOB [birth year], MRN [id number]  PCP:  Jacob Arabian, MD  Cardiologist:  Jacob Grooms, MD   Referring MD: Jacob Arabian, MD   Chief Complaint  Patient presents with   Cardiac Valve Problem   Hypertension     History of Present Illness:    Jacob Small is a 65 y.o. male with a hx of essential hypertension, hyperlipidemia, prior myocardial infarction, morbid obesity, bilateral hearing loss, bicuspid aortic valve, status post aneurysm repair and valve sparing aortic root replacement 2012, CAD with CABG (LIMA to LAD and SVG to PDA) 2012.  Jacob Small is doing okay.  He had a hacking cough earlier this year.  He was tested for COVID multiple times but was negative.  He eventually used a home remedy and the cough disappeared after about 3 days.  Exertional tolerance has been stable.  He has not had a primary care checkup since the pandemic has been going on.  He denies angina/chest pain.  Past Medical History:  Diagnosis Date   Aortic aneurysm, thoracic (HCC)    Bilateral hearing loss    CAD (coronary artery disease)    CAD (coronary artery disease), native coronary artery    Aortic Root Replacement and CABGx2 on 09/22/2010  Dr. 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)    Past Surgical History:  Procedure Laterality Date   AORTIC ROOT REPLACEMENT  09/22/2010   Median sternotomy for valve-sparing aortic root replacemnt Jacob Small reimplanation technique) with resection of engrafting of the ascending thoracic aorta .Partial circulatoru arrest, coronaru aartery bypass grafting X2 (left intrnal mammary artery to distal left anterior descending coronary artery,  saphenous vein graft to posterrior descending coronary artery, EVH rt thigh   CORONARY ARTERY BYPASS GRAFT  09/22/2010   CABGx2 with LIMA to LAD, SVG to PDA   CORONARY ARTERY BYPASS GRAFT     CYSTOSCOPY WITH INSERTION OF UROLIFT N/A 03/01/2019   Procedure: CYSTOSCOPY WITH INSERTION OF UROLIFT;  Surgeon: Jacob Cowper, MD;  Location: [facility] ORS;  Service: Urology;  Laterality: N/A;   EXTERNAL EAR SURGERY     HAND SURGERY     KNEE DEBRIDEMENT     02/2008 CHONDROPLASTY/DEB. OF MENISCUS   PILONIDAL CYST EXCISION     PROSTATE SURGERY  09/30/10   BX  2010 NEG    Current Medications: Current Meds  Medication Sig   amLODipine (NORVASC) 5 MG tablet TAKE 1 TABLET BY MOUTH EVERY DAY   aspirin EC 81 MG tablet Take 1 tablet (81 mg total) by mouth daily.   carvedilol (COREG) 6.25 MG tablet Take 1 tablet (6.25 mg total) by mouth 2 (two) times daily with a meal.   [DISCONTINUED] rosuvastatin (CRESTOR) 5 MG tablet TAKE 1 TABLET BY MOUTH THREE TIMES A WEEK     Allergies:   Lisinopril, Toprol xl [metoprolol succinate], Lisinopril, Metoprolol, and Other   Social History   Socioeconomic History   Marital status: Married    Spouse name: Not on file   Number of children: Not on file   Years of education: Not on file  Highest education level: Not on file  Occupational History   Not on file  Tobacco Use   Smoking status: Former    Pack years: 0.00   Smokeless tobacco: Never  Vaping Use   Vaping Use: Never used  Substance and Sexual Activity   Alcohol use: Not Currently   Drug use: Never   Sexual activity: Not on file  Other Topics Concern   Not on file  Social History Narrative   ** Merged History Encounter **       Married ,lives in [place] Determinants of Health   Financial Resource Strain: Not on file  Food Insecurity: Not on file  Transportation Needs: Not on file  Physical Activity: Not on file  Stress: Not on file  Social Connections: Not on file     Family  History: The patient's family history includes Cancer in his brother; Heart disease in his father.  ROS:   Please see the history of present illness.    Denies orthopnea, snoring, palpitations, and syncope.  All other systems reviewed and are negative.  EKGs/Labs/Other Studies Reviewed:    The following studies were reviewed today: No new imaging data.    Last echo 08/27/2019: IMPRESSIONS    1. Left ventricular ejection fraction, by estimation, is 60 to 65%. The  left ventricle has normal function. The left ventricle has no regional  wall motion abnormalities. There is moderate concentric left ventricular  hypertrophy. Left ventricular  diastolic parameters are consistent with Grade II diastolic dysfunction  (pseudonormalization).  2. Right ventricular systolic function is normal. The right ventricular  size is normal.  3. Left atrial size was moderately dilated.  4. The mitral valve is normal in structure. No evidence of mitral valve  regurgitation. No evidence of mitral stenosis.  5. The aortic valve is normal in structure. Aortic valve regurgitation is  trivial. No aortic stenosis is present.  6. The inferior vena cava is normal in size with greater than 50%  respiratory variability, suggesting right atrial pressure of 3 mmHg.    EKG:  EKG normal sinus rhythm, relatively low voltage, no abnormality.  There is no change when compared to prior.  Recent Labs: 08/29/2019: ALT 16; BUN 21; Creatinine, Ser 0.96; NT-Pro BNP 64; Potassium 3.8; Sodium 141  Recent Lipid Panel    Component Value Date/Time   CHOL 104 08/29/2019 0841   TRIG 151 (H) 08/29/2019 0841   HDL 24 (L) 08/29/2019 0841   CHOLHDL 4.3 08/29/2019 0841   LDLCALC 54 08/29/2019 0841    Physical Exam:    VS:  BP 132/60   Pulse 68   Ht 6\' 3"  (1.905 m)   Wt (!) 331 lb 3.2 oz (150.2 kg)   SpO2 96%   BMI 41.40 kg/m     Wt Readings from Last 3 Encounters:  08/25/20 (!) 331 lb 3.2 oz (150.2 kg)  08/29/19 (!)  329 lb (149.2 kg)  03/01/19 (!) 328 lb 6.4 oz (149 kg)     GEN: This. No acute distress HEENT: Normal NECK: No JVD. LYMPHATICS: No lymphadenopathy CARDIAC: 1-2 over 6 right upper sternal systolic murmur without diastolic murmur. RRR S4 gallop gallop, or edema. VASCULAR:  Normal Pulses. No bruits. RESPIRATORY:  Clear to auscultation without rales, wheezing or rhonchi  ABDOMEN: Soft, non-tender, non-distended, No pulsatile mass, MUSCULOSKELETAL: No deformity  SKIN: Warm and dry NEUROLOGIC:  Alert and oriented x 3 PSYCHIATRIC:  Normal affect   ASSESSMENT:    1. Coronary artery  disease involving coronary bypass graft of native heart with angina pectoris ([place])   2. S/P thoracic aortic aneurysm repair   3. Bicuspid aortic valve   4. Mixed hyperlipidemia   5. Essential hypertension   6. Left ventricular hypertrophy    PLAN:    In order of problems listed above:  Secondary prevention discussed Patient is status post valve sparing ascending aortic aneurysm repair, Doroteo [place]. The most recent echo did not suggest that he has a bicuspid valve.  He had valve sparing/David II surgery and has no significant stenosis or regurgitation at this time. Continue rosuvastatin 5 mg/day.  Most recent LDL was 54.  Lipid panel will be repeated today along with a liver panel Excellent current blood pressure control.  Continue carvedilol 6.25 mg twice daily, Cozaar 100 mg/day and hydrochlorothiazide 25 mg/day.  Target blood pressure 130/80. See echo noted above from 2021.  Clinical follow-up in 1 year.  May need to repeat echocardiogram in 2 years.   Medication Adjustments/Labs and Tests Ordered: Current medicines are reviewed at length with the patient today.  Concerns regarding medicines are outlined above.  Orders Placed This Encounter  Procedures   Lipid panel   Basic metabolic panel   Hepatic function panel   HgB A1c   CBC   TSH   EKG 12-Lead   Meds ordered this encounter  Medications    rosuvastatin (CRESTOR) 5 MG tablet    Sig: Take one tablet by mouth three times per week    Dispense:  12 tablet    Refill:  11     Patient Instructions  Medication Instructions:  Your physician recommends that you continue on your current medications as directed. Please refer to the Current Medication list given to you today.  *If you need a refill on your cardiac medications before your next appointment, please call your pharmacy*   Lab Work: BMET, Liver, Lipid, A1C, CBC and TSH today  If you have labs (blood work) drawn today and your tests are completely normal, you will receive your results only by: [place] (if you have MyChart) OR A paper copy in the mail If you have any lab test that is abnormal or we need to change your treatment, we will call you to review the results.   Testing/Procedures: None   Follow-Up: At [facility], you and your health needs are our priority.  As part of our continuing mission to provide you with exceptional heart care, we have created designated Provider Care Teams.  These Care Teams include your primary Cardiologist (physician) and Advanced Practice Providers (APPs -  Physician Assistants and Nurse Practitioners) who all work together to provide you with the care you need, when you need it.  We recommend signing up for the patient portal called "MyChart".  Sign up information is provided on this After Visit Summary.  MyChart is used to connect with patients for Virtual Visits (Telemedicine).  Patients are able to view lab/test results, encounter notes, upcoming appointments, etc.  Non-urgent messages can be sent to your provider as well.   To learn more about what you can do with MyChart, go to [email or web address].    Your next appointment:   1 year(s)  The format for your next appointment:   In Person  Provider:   You may see Jacob Grooms, MD or one of the following Advanced Practice Providers on your designated Care  Team:   Cecilie Kicks, NP    Other Instructions  Signed, Jacob Grooms, MD  08/25/2020 6:35 PM    [place]

## 2020-08-24 ENCOUNTER — Other Ambulatory Visit: Payer: Self-pay | Admitting: Interventional Cardiology

## 2020-08-25 ENCOUNTER — Encounter: Payer: Self-pay | Admitting: Interventional Cardiology

## 2020-08-25 ENCOUNTER — Ambulatory Visit (INDEPENDENT_AMBULATORY_CARE_PROVIDER_SITE_OTHER): Payer: Medicare Other | Admitting: Interventional Cardiology

## 2020-08-25 ENCOUNTER — Other Ambulatory Visit: Payer: Self-pay

## 2020-08-25 VITALS — BP 132/60 | HR 68 | Ht 75.0 in | Wt 331.2 lb

## 2020-08-25 DIAGNOSIS — Z8679 Personal history of other diseases of the circulatory system: Secondary | ICD-10-CM

## 2020-08-25 DIAGNOSIS — E782 Mixed hyperlipidemia: Secondary | ICD-10-CM | POA: Diagnosis not present

## 2020-08-25 DIAGNOSIS — I25709 Atherosclerosis of coronary artery bypass graft(s), unspecified, with unspecified angina pectoris: Secondary | ICD-10-CM | POA: Diagnosis not present

## 2020-08-25 DIAGNOSIS — Q231 Congenital insufficiency of aortic valve: Secondary | ICD-10-CM

## 2020-08-25 DIAGNOSIS — Z9889 Other specified postprocedural states: Secondary | ICD-10-CM | POA: Diagnosis not present

## 2020-08-25 DIAGNOSIS — I10 Essential (primary) hypertension: Secondary | ICD-10-CM

## 2020-08-25 DIAGNOSIS — I517 Cardiomegaly: Secondary | ICD-10-CM

## 2020-08-25 MED ORDER — ROSUVASTATIN CALCIUM 5 MG PO TABS: ORAL_TABLET | ORAL | 11 refills | Status: DC

## 2020-08-25 NOTE — Patient Instructions (Signed)
Medication Instructions:  Your physician recommends that you continue on your current medications as directed. Please refer to the Current Medication list given to you today.  *If you need a refill on your cardiac medications before your next appointment, please call your pharmacy*   Lab Work: BMET, Liver, Lipid, A1C, CBC and TSH today  If you have labs (blood work) drawn today and your tests are completely normal, you will receive your results only by: [place] (if you have MyChart) OR A paper copy in the mail If you have any lab test that is abnormal or we need to change your treatment, we will call you to review the results.   Testing/Procedures: None   Follow-Up: At [facility], you and your health needs are our priority.  As part of our continuing mission to provide you with exceptional heart care, we have created designated Provider Care Teams.  These Care Teams include your primary Cardiologist (physician) and Advanced Practice Providers (APPs -  Physician Assistants and Nurse Practitioners) who all work together to provide you with the care you need, when you need it.  We recommend signing up for the patient portal called "MyChart".  Sign up information is provided on this After Visit Summary.  MyChart is used to connect with patients for Virtual Visits (Telemedicine).  Patients are able to view lab/test results, encounter notes, upcoming appointments, etc.  Non-urgent messages can be sent to your provider as well.   To learn more about what you can do with MyChart, go to [email or web address].    Your next appointment:   1 year(s)  The format for your next appointment:   In Person  Provider:   You may see Sinclair Grooms, MD or one of the following Advanced Practice Providers on your designated Care Team:   Cecilie Kicks, NP    Other Instructions

## 2020-08-26 LAB — HEPATIC FUNCTION PANEL
ALT: 22 IU/L (ref 0–44)
AST: 18 IU/L (ref 0–40)
Albumin: 4.7 g/dL (ref 3.8–4.8)
Alkaline Phosphatase: 102 IU/L (ref 44–121)
Bilirubin Total: 1.7 mg/dL — ABNORMAL HIGH (ref 0.0–1.2)
Bilirubin, Direct: 0.3 mg/dL (ref 0.00–0.40)
Total Protein: 7 g/dL (ref 6.0–8.5)

## 2020-08-26 LAB — BASIC METABOLIC PANEL
BUN/Creatinine Ratio: 16 (ref 10–24)
BUN: 15 mg/dL (ref 8–27)
CO2: 27 mmol/L (ref 20–29)
Calcium: 9.8 mg/dL (ref 8.6–10.2)
Chloride: 99 mmol/L (ref 96–106)
Creatinine, Ser: 0.93 mg/dL (ref 0.76–1.27)
Glucose: 98 mg/dL (ref 65–99)
Potassium: 4.3 mmol/L (ref 3.5–5.2)
Sodium: 141 mmol/L (ref 134–144)
eGFR: 91 mL/min/{1.73_m2} (ref >59)

## 2020-08-26 LAB — CBC
Hematocrit: 43 % (ref 37.5–51.0)
Hemoglobin: 14.5 g/dL (ref 13.0–17.7)
MCH: 27.9 pg (ref 26.6–33.0)
MCHC: 33.7 g/dL (ref 31.5–35.7)
MCV: 83 fL (ref 79–97)
Platelets: 191 10*3/uL (ref 150–450)
RBC: 5.19 x10E6/uL (ref 4.14–5.80)
RDW: 14.1 % (ref 11.6–15.4)
WBC: 7.9 10*3/uL (ref 3.4–10.8)

## 2020-08-26 LAB — HEMOGLOBIN A1C
Est. average glucose Bld gHb Est-mCnc: 114 mg/dL
Hgb A1c MFr Bld: 5.6 % (ref 4.8–5.6)

## 2020-08-26 LAB — LIPID PANEL
Chol/HDL Ratio: 4.6 ratio (ref 0.0–5.0)
Cholesterol, Total: 110 mg/dL (ref 100–199)
HDL: 24 mg/dL — ABNORMAL LOW (ref >39)
LDL Chol Calc (NIH): 57 mg/dL (ref 0–99)
Triglycerides: 168 mg/dL — ABNORMAL HIGH (ref 0–149)
VLDL Cholesterol Cal: 29 mg/dL (ref 5–40)

## 2020-08-26 LAB — TSH: TSH: 2.16 u[IU]/mL (ref 0.450–4.500)

## 2020-08-29 ENCOUNTER — Other Ambulatory Visit: Payer: Self-pay | Admitting: Interventional Cardiology

## 2020-09-22 ENCOUNTER — Telehealth: Payer: Self-pay | Admitting: Interventional Cardiology

## 2020-09-22 ENCOUNTER — Other Ambulatory Visit: Payer: Self-pay | Admitting: Interventional Cardiology

## 2020-09-22 NOTE — Telephone Encounter (Signed)
*  STAT* If patient is at the pharmacy, call can be transferred to refill team.   1. Which medications need to be refilled? (please list name of each medication and dose if known)  losartan-hydrochlorothiazide (HYZAAR) 100-25 MG tablet carvedilol (COREG) 6.25 MG tablet  2. Which pharmacy/location (including street and city if local pharmacy) is medication to be sent to? [id number] [facility] [address]. Do they need a 30 day or 90 day supply? 30 day

## 2020-09-22 NOTE — Telephone Encounter (Signed)
Ok to fill Losartan-HCTZ (Hyzaar).  Doses are equivalent.

## 2020-09-23 MED ORDER — CARVEDILOL 6.25 MG PO TABS: 6.2500 mg | ORAL_TABLET | Freq: Two times a day (BID) | ORAL | 3 refills | Status: DC

## 2020-09-23 MED ORDER — LOSARTAN POTASSIUM-HCTZ 100-25 MG PO TABS: 1.0000 | ORAL_TABLET | Freq: Every day | ORAL | 3 refills | Status: DC

## 2020-09-23 NOTE — Telephone Encounter (Signed)
[facility] [place].

## 2021-08-14 ENCOUNTER — Other Ambulatory Visit: Payer: Self-pay | Admitting: Interventional Cardiology

## 2021-08-18 ENCOUNTER — Other Ambulatory Visit: Payer: Self-pay | Admitting: Interventional Cardiology

## 2021-09-19 NOTE — Progress Notes (Signed)
Cardiology Office Note:    Date:  09/23/2021   ID:  Jacob Small, DOB [birth year], MRN [id number]  PCP:  Jacob Arabian, MD  Cardiologist:  Jacob Grooms, MD   Referring MD: Jacob Arabian, MD   Chief Complaint  Patient presents with   Coronary Artery Disease   Cardiac Valve Problem   Hyperlipidemia   Hypertension    History of Present Illness:    Jacob Small is a 66 y.o. male with a hx of essential hypertension, hyperlipidemia, prior myocardial infarction, morbid obesity, bilateral hearing loss, bicuspid aortic valve, status post aneurysm repair and valve sparing aortic root replacement 2012, CAD with CABG (LIMA to LAD and SVG to PDA) 2012.   Feels well.  No orthopnea or PND.  Does not sleep well.  Physically active.  No prolonged palpitations or syncope.  No significant lower extremity swelling.  Occasional left lateral chest discomfort.  Donated blood several weeks ago and the blood clotted.  He was wondering if this meant anything.  He has not had any lower extremity swelling or incidence of DVT or stroke.  Past Medical History:  Diagnosis Date   Aortic aneurysm, thoracic (HCC)    Bilateral hearing loss    CAD (coronary artery disease)    CAD (coronary artery disease), native coronary artery    Aortic Root Replacement and CABGx2 on 09/22/2010  Dr. Roxy Small   Coronary artery disease    HTN (hypertension)    Hyperlipidemia    Hypertension    Myocardial infarction [facility])    Obesity, morbid ([place])    S/P CABG x 2 09/22/2010   LIMA to LAD, SVG to PDA   S/P thoracic aortic aneurysm repair 09/22/2010   Valve-sparing aortic root replacement (David Type I)    Past Surgical History:  Procedure Laterality Date   AORTIC ROOT REPLACEMENT  09/22/2010   Median sternotomy for valve-sparing aortic root replacemnt Jacob Small I reimplanation technique) with resection of engrafting of the ascending thoracic aorta .Partial circulatoru arrest, coronaru aartery bypass grafting X2 (left  intrnal mammary artery to distal left anterior descending coronary artery, saphenous vein graft to posterrior descending coronary artery, EVH rt thigh   CORONARY ARTERY BYPASS GRAFT  09/22/2010   CABGx2 with LIMA to LAD, SVG to PDA   CORONARY ARTERY BYPASS GRAFT     CYSTOSCOPY WITH INSERTION OF UROLIFT N/A 03/01/2019   Procedure: CYSTOSCOPY WITH INSERTION OF UROLIFT;  Surgeon: Jacob Cowper, MD;  Location: [facility] ORS;  Service: Urology;  Laterality: N/A;   EXTERNAL EAR SURGERY     HAND SURGERY     KNEE DEBRIDEMENT     02/2008 CHONDROPLASTY/DEB. OF MENISCUS   PILONIDAL CYST EXCISION     PROSTATE SURGERY  09/30/10   BX  2010 NEG    Current Medications: Current Meds  Medication Sig   amLODipine (NORVASC) 5 MG tablet TAKE 1 TABLET BY MOUTH EVERY DAY   aspirin EC 81 MG tablet Take 1 tablet (81 mg total) by mouth daily.   carvedilol (COREG) 6.25 MG tablet Take 1 tablet (6.25 mg total) by mouth 2 (two) times daily with a meal.   losartan-hydrochlorothiazide (HYZAAR) 100-25 MG tablet Take 1 tablet by mouth daily. Take 1 tablet by mouth daily.   rosuvastatin (CRESTOR) 5 MG tablet TAKE 1 TABLET BY MOUTH THREE TIMES A WEEK.     Allergies:   Lisinopril, Toprol xl [metoprolol succinate], Lisinopril, Metoprolol, Other, and Rosuvastatin   Social History   Socioeconomic History  Marital status: Married    Spouse name: Not on file   Number of children: Not on file   Years of education: Not on file   Highest education level: Not on file  Occupational History   Not on file  Tobacco Use   Smoking status: Former    Types: Cigarettes    Quit date: 02/16/1995    Years since quitting: 26.6   Smokeless tobacco: Never  Vaping Use   Vaping Use: Never used  Substance and Sexual Activity   Alcohol use: Not Currently   Drug use: Never   Sexual activity: Not on file  Other Topics Concern   Not on file  Social History Narrative   ** Merged History Encounter **       Married ,lives in [place] Determinants of Health   Financial Resource Strain: Not on file  Food Insecurity: Not on file  Transportation Needs: Not on file  Physical Activity: Not on file  Stress: Not on file  Social Connections: Not on file     Family History: The patient's family history includes Cancer in his brother; Heart disease in his father.  ROS:   Please see the history of present illness.    Denies limitations.  Does not sleep well.  Says his wife does not complain that he snores.  No excessive daytime sleepiness.  All other systems reviewed and are negative.  EKGs/Labs/Other Studies Reviewed:    The following studies were reviewed today:  ECHOCARDIOGRAM 2021: IMPRESSIONS   1. Left ventricular ejection fraction, by estimation, is 60 to 65%. The  left ventricle has normal function. The left ventricle has no regional  wall motion abnormalities. There is moderate concentric left ventricular  hypertrophy. Left ventricular  diastolic parameters are consistent with Grade II diastolic dysfunction  (pseudonormalization).   2. Right ventricular systolic function is normal. The right ventricular  size is normal.   3. Left atrial size was moderately dilated.   4. The mitral valve is normal in structure. No evidence of mitral valve  regurgitation. No evidence of mitral stenosis.   5. The aortic valve is normal in structure. Aortic valve regurgitation is  trivial. No aortic stenosis is present.   6. The inferior vena cava is normal in size with greater than 50%  respiratory variability, suggesting right atrial pressure of 3 mmHg.   EKG:  EKG sinus bradycardia, inferior Q waves, PVCs, with low voltage.  Otherwise when compared to the tracing from July 2022, the heart rate is slower, and PVCs are new.  Recent Labs: No results found for requested labs within last 365 days.  Recent Lipid Panel    Component Value Date/Time   CHOL 110 08/25/2020 1527   TRIG 168 (H) 08/25/2020 1527   HDL 24  (L) 08/25/2020 1527   CHOLHDL 4.6 08/25/2020 1527   LDLCALC 57 08/25/2020 1527    Physical Exam:    VS:  BP 130/80   Pulse (!) 56   Ht '6\' 3"'$  (1.905 m)   Wt (!) 329 lb 9.6 oz (149.5 kg)   SpO2 96%   BMI 41.20 kg/m     Wt Readings from Last 3 Encounters:  09/23/21 (!) 329 lb 9.6 oz (149.5 kg)  08/25/20 (!) 331 lb 3.2 oz (150.2 kg)  08/29/19 (!) 329 lb (149.2 kg)     GEN: Markedly obese/morbid. No acute distress HEENT: Normal NECK: No JVD. LYMPHATICS: No lymphadenopathy CARDIAC: 1-2/6 right upper sternal systolic without diastolic murmur.  RRR S4.  Gallop, or edema. VASCULAR:  Normal Pulses. No bruits. RESPIRATORY:  Clear to auscultation without rales, wheezing or rhonchi  ABDOMEN: Soft, non-tender, non-distended, No pulsatile mass, MUSCULOSKELETAL: No deformity  SKIN: Warm and dry NEUROLOGIC:  Alert and oriented x 3 PSYCHIATRIC:  Normal affect   ASSESSMENT:    1. Coronary artery disease involving coronary bypass graft of native heart with angina pectoris ([place])   2. Bicuspid aortic valve   3. Mixed hyperlipidemia   4. Essential hypertension   5. Left ventricular hypertrophy   6. S/P thoracic aortic aneurysm repair    PLAN:    In order of problems listed above:  Stable without angina.  Laboratory data will be obtained today including a hemoglobin A1c. 2D Doppler echocardiogram to follow-up on bicuspid aortic valve.  Systolic murmur is heard today. Continue Crestor 5 mg/day. Continue carvedilol and amlodipine for blood pressure control. 2D Doppler echocardiogram for follow-up. Status post ascending aortic aneurysm repair with valve sparing surgery.  2D Doppler echo will help assess ascending aorta.   Overall education and awareness concerning secondary risk prevention was discussed in detail: LDL less than 70, hemoglobin A1c less than 7, blood pressure target less than 130/80 mmHg, >150 minutes of moderate aerobic activity per week, avoidance of smoking, weight  control (via diet and exercise), and continued surveillance/management of/for obstructive sleep apnea.  He does not sleep well.  At some point in future we should consider a sleep study.    Medication Adjustments/Labs and Tests Ordered: Current medicines are reviewed at length with the patient today.  Concerns regarding medicines are outlined above.  No orders of the defined types were placed in this encounter.  No orders of the defined types were placed in this encounter.   There are no Patient Instructions on file for this visit.   Signed, Jacob Grooms, MD  09/23/2021 9:09 AM    [place]

## 2021-09-23 ENCOUNTER — Encounter: Payer: Self-pay | Admitting: Interventional Cardiology

## 2021-09-23 ENCOUNTER — Ambulatory Visit (INDEPENDENT_AMBULATORY_CARE_PROVIDER_SITE_OTHER): Payer: Medicare Other | Admitting: Interventional Cardiology

## 2021-09-23 VITALS — BP 130/80 | HR 56 | Ht 75.0 in | Wt 329.6 lb

## 2021-09-23 DIAGNOSIS — Q231 Congenital insufficiency of aortic valve: Secondary | ICD-10-CM

## 2021-09-23 DIAGNOSIS — I10 Essential (primary) hypertension: Secondary | ICD-10-CM | POA: Diagnosis not present

## 2021-09-23 DIAGNOSIS — Z8679 Personal history of other diseases of the circulatory system: Secondary | ICD-10-CM

## 2021-09-23 DIAGNOSIS — I25709 Atherosclerosis of coronary artery bypass graft(s), unspecified, with unspecified angina pectoris: Secondary | ICD-10-CM

## 2021-09-23 DIAGNOSIS — Z9889 Other specified postprocedural states: Secondary | ICD-10-CM

## 2021-09-23 DIAGNOSIS — I517 Cardiomegaly: Secondary | ICD-10-CM

## 2021-09-23 DIAGNOSIS — E782 Mixed hyperlipidemia: Secondary | ICD-10-CM | POA: Diagnosis not present

## 2021-09-23 NOTE — Patient Instructions (Signed)
Medication Instructions:  Your physician recommends that you continue on your current medications as directed. Please refer to the Current Medication list given to you today.  *If you need a refill on your cardiac medications before your next appointment, please call your pharmacy*  Lab Work: TODAY: CMET, Lipids, CBC, Hgb A1c If you have labs (blood work) drawn today and your tests are completely normal, you will receive your results only by: [place] (if you have MyChart) OR A paper copy in the mail If you have any lab test that is abnormal or we need to change your treatment, we will call you to review the results.  Testing/Procedures: Your physician has requested that you have an echocardiogram. Echocardiography is a painless test that uses sound waves to create images of your heart. It provides your doctor with information about the size and shape of your heart and how well your heart's chambers and valves are working. This procedure takes approximately one hour. There are no restrictions for this procedure.  Follow-Up: At [facility], you and your health needs are our priority.  As part of our continuing mission to provide you with exceptional heart care, we have created designated Provider Care Teams.  These Care Teams include your primary Cardiologist (physician) and Advanced Practice Providers (APPs -  Physician Assistants and Nurse Practitioners) who all work together to provide you with the care you need, when you need it.  Your next appointment:   1 year(s)  The format for your next appointment:   In Person  Provider:   Sinclair Grooms, MD {   Important Information About [place]

## 2021-09-24 LAB — COMPREHENSIVE METABOLIC PANEL
ALT: 18 IU/L (ref 0–44)
AST: 21 IU/L (ref 0–40)
Albumin/Globulin Ratio: 2 (ref 1.2–2.2)
Albumin: 4.6 g/dL (ref 3.9–4.9)
Alkaline Phosphatase: 91 IU/L (ref 44–121)
BUN/Creatinine Ratio: 16 (ref 10–24)
BUN: 14 mg/dL (ref 8–27)
Bilirubin Total: 1.2 mg/dL (ref 0.0–1.2)
CO2: 25 mmol/L (ref 20–29)
Calcium: 9.9 mg/dL (ref 8.6–10.2)
Chloride: 100 mmol/L (ref 96–106)
Creatinine, Ser: 0.85 mg/dL (ref 0.76–1.27)
Globulin, Total: 2.3 g/dL (ref 1.5–4.5)
Glucose: 95 mg/dL (ref 70–99)
Potassium: 4 mmol/L (ref 3.5–5.2)
Sodium: 139 mmol/L (ref 134–144)
Total Protein: 6.9 g/dL (ref 6.0–8.5)
eGFR: 96 mL/min/{1.73_m2} (ref >59)

## 2021-09-24 LAB — CBC
Hematocrit: 41.9 % (ref 37.5–51.0)
Hemoglobin: 13.8 g/dL (ref 13.0–17.7)
MCH: 25.9 pg — ABNORMAL LOW (ref 26.6–33.0)
MCHC: 32.9 g/dL (ref 31.5–35.7)
MCV: 79 fL (ref 79–97)
Platelets: 188 10*3/uL (ref 150–450)
RBC: 5.32 x10E6/uL (ref 4.14–5.80)
RDW: 16.2 % — ABNORMAL HIGH (ref 11.6–15.4)
WBC: 8 10*3/uL (ref 3.4–10.8)

## 2021-09-24 LAB — HEMOGLOBIN A1C
Est. average glucose Bld gHb Est-mCnc: 120 mg/dL
Hgb A1c MFr Bld: 5.8 % — ABNORMAL HIGH (ref 4.8–5.6)

## 2021-09-24 LAB — LIPID PANEL
Chol/HDL Ratio: 4.8 ratio (ref 0.0–5.0)
Cholesterol, Total: 125 mg/dL (ref 100–199)
HDL: 26 mg/dL — ABNORMAL LOW (ref >39)
LDL Chol Calc (NIH): 73 mg/dL (ref 0–99)
Triglycerides: 145 mg/dL (ref 0–149)
VLDL Cholesterol Cal: 26 mg/dL (ref 5–40)

## 2021-09-27 ENCOUNTER — Other Ambulatory Visit: Payer: Self-pay | Admitting: Interventional Cardiology

## 2021-10-07 ENCOUNTER — Ambulatory Visit (HOSPITAL_COMMUNITY): Payer: Medicare Other | Attending: Cardiovascular Disease

## 2021-10-07 DIAGNOSIS — Q231 Congenital insufficiency of aortic valve: Secondary | ICD-10-CM | POA: Diagnosis present

## 2021-10-07 LAB — ECHOCARDIOGRAM COMPLETE
AR max vel: 1.98 cm2
AV Area VTI: 2.08 cm2
AV Area mean vel: 2.12 cm2
AV Mean grad: 12 mmHg
AV Peak grad: 22.5 mmHg
Ao pk vel: 2.37 m/s
Area-P 1/2: 4.96 cm2
P 1/2 time: 356 msec
S' Lateral: 3.3 cm

## 2021-10-07 MED ORDER — PERFLUTREN LIPID MICROSPHERE
1.0000 mL | INTRAVENOUS | Status: AC | PRN
  Administered 2021-10-07: 3 mL via INTRAVENOUS

## 2021-10-14 ENCOUNTER — Telehealth: Payer: Self-pay | Admitting: Interventional Cardiology

## 2021-10-14 NOTE — Telephone Encounter (Signed)
Patient returned RN's call regarding results. 

## 2021-10-14 NOTE — Telephone Encounter (Signed)
Returned call to patient and discussed echocardiogram results.

## 2021-10-21 ENCOUNTER — Telehealth: Payer: Self-pay | Admitting: Interventional Cardiology

## 2021-10-21 NOTE — Telephone Encounter (Signed)
Pt states that they need a call back to discuss results given to him recently. He would like to know if he can do a virtual appt with the doctor to discuss or if he can come in. He states he would also like his wife to come along to help him understand better. Please advise.

## 2021-10-21 NOTE — Telephone Encounter (Signed)
Returned call to patient to discuss echo results.  Patient states he has never heard of having a bicuspid aortic valve from previous echoes. He has several questions regarding this and would like to discuss this with Dr. Tamala Julian over the phone with his wife Juliann Pulse present.  Patient states he has hearing aids and is not sure he hears everything said to him clearly and would like his wife to hear what Dr. Tamala Julian has to say.  Patient requests Dr. Tamala Julian call him and his wife Juliann Pulse on her phone [phone] to discuss his results.

## 2021-10-27 ENCOUNTER — Encounter (INDEPENDENT_AMBULATORY_CARE_PROVIDER_SITE_OTHER): Payer: Self-pay

## 2021-10-28 ENCOUNTER — Ambulatory Visit (INDEPENDENT_AMBULATORY_CARE_PROVIDER_SITE_OTHER): Payer: Medicare Other | Admitting: Ophthalmology

## 2021-10-28 ENCOUNTER — Encounter (INDEPENDENT_AMBULATORY_CARE_PROVIDER_SITE_OTHER): Payer: Self-pay | Admitting: Ophthalmology

## 2021-10-28 DIAGNOSIS — H43822 Vitreomacular adhesion, left eye: Secondary | ICD-10-CM | POA: Diagnosis not present

## 2021-10-28 DIAGNOSIS — H35352 Cystoid macular degeneration, left eye: Secondary | ICD-10-CM | POA: Diagnosis not present

## 2021-10-28 DIAGNOSIS — H2513 Age-related nuclear cataract, bilateral: Secondary | ICD-10-CM

## 2021-10-28 DIAGNOSIS — H4311 Vitreous hemorrhage, right eye: Secondary | ICD-10-CM

## 2021-10-28 NOTE — Assessment & Plan Note (Signed)
Will evaluate in the future if required.  No impact on acuity at this time

## 2021-10-28 NOTE — Assessment & Plan Note (Signed)
Follow-up Dr. Katy Fitch as scheduled

## 2021-10-28 NOTE — Progress Notes (Signed)
10/28/2021     CHIEF COMPLAINT Patient presents for  Chief Complaint  Patient presents with   Retina Evaluation      HISTORY OF PRESENT ILLNESS: Jacob Small is a 66 y.o. male who presents to the clinic today for:   HPI     Retina Evaluation           Laterality: right eye   Associated Symptoms: Negative for Flashes, Floaters, Distortion, Blind Spot, Pain, Redness, Photophobia, Glare, Trauma, Scalp Tenderness, Jaw Claudication, Shoulder/Hip pain, Fever, Weight Loss and Fatigue         Comments   NP VIT HEM REF DR ROBERT GROAT. Pt stated seeing strings in vision, mainly in the right eye. Pt reports symptoms started 10/27/21. Pt stated vision is a little better today.  Pt stated, "I went to see Dr. Clent Jacks yesterday and he wanted me to see Dr. Zadie Rhine because they think it has something to with my retina. I had a fall two days ago but I didn't notice any changes in my vision. But when I woke up the next morning, I noticed the string and it was really bad. I told dr. Katy Fitch I didn't see any FOL."       Last edited by Silvestre Moment on 10/28/2021  8:25 AM.      Referring physician: Clent Jacks, MD [address] [place],  [place] [address]  HISTORICAL INFORMATION:   Selected notes from the MEDICAL RECORD NUMBER    Lab Results  Component Value Date   HGBA1C 5.8 (H) 09/23/2021     CURRENT MEDICATIONS: No current outpatient medications on file. (Ophthalmic Drugs)   No current facility-administered medications for this visit. (Ophthalmic Drugs)   Current Outpatient Medications (Other)  Medication Sig   amLODipine (NORVASC) 5 MG tablet TAKE 1 TABLET BY MOUTH EVERY DAY   aspirin EC 81 MG tablet Take 1 tablet (81 mg total) by mouth daily.   carvedilol (COREG) 6.25 MG tablet TAKE 1 TABLET BY MOUTH 2 TIMES DAILY WITH A MEAL.   losartan-hydrochlorothiazide (HYZAAR) 100-25 MG tablet Take 1 tablet by mouth daily. Take 1 tablet by mouth daily.   rosuvastatin (CRESTOR) 5  MG tablet TAKE 1 TABLET BY MOUTH THREE TIMES A WEEK.   No current facility-administered medications for this visit. (Other)      REVIEW OF SYSTEMS: ROS   Negative for: Constitutional, Gastrointestinal, Neurological, Skin, Genitourinary, Musculoskeletal, HENT, Endocrine, Cardiovascular, Eyes, Respiratory, Psychiatric, Allergic/Imm, Heme/Lymph Last edited by Silvestre Moment on 10/28/2021  8:25 AM.       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)   Past Surgical History:  Procedure Laterality Date   AORTIC ROOT REPLACEMENT  09/22/2010   Median sternotomy for valve-sparing aortic root replacemnt Shanon Brow I reimplanation technique) with resection of engrafting of the ascending thoracic aorta .Partial circulatoru arrest, coronaru aartery bypass grafting X2 (left intrnal mammary artery to distal left anterior descending coronary artery, saphenous vein graft to posterrior descending coronary artery, EVH rt thigh   CORONARY ARTERY BYPASS GRAFT  09/22/2010   CABGx2 with LIMA to LAD, SVG to PDA   CORONARY ARTERY BYPASS GRAFT     CYSTOSCOPY WITH INSERTION OF UROLIFT N/A 03/01/2019    Procedure: CYSTOSCOPY WITH INSERTION OF UROLIFT;  Surgeon: Royston Cowper, MD;  Location: [facility] ORS;  Service: Urology;  Laterality: N/A;   EXTERNAL EAR SURGERY     HAND SURGERY     KNEE DEBRIDEMENT     02/2008 CHONDROPLASTY/DEB. OF MENISCUS   PILONIDAL CYST EXCISION     PROSTATE SURGERY  09/30/10   BX  2010 NEG    FAMILY HISTORY Family History  Problem Relation Age of Onset   Heart disease Father    Cancer Brother        skin    SOCIAL HISTORY Social History   Tobacco Use   Smoking status: Former    Types: Cigarettes    Quit date: 02/16/1995    Years since quitting: 26.7   Smokeless tobacco: Never  Vaping Use   Vaping Use: Never used  Substance Use Topics   Alcohol use: Not Currently   Drug use: Never         OPHTHALMIC EXAM:  Base Eye Exam     Visual Acuity (ETDRS)       Right Left   Dist [place] 20/25 20/25 -1         Tonometry (Tonopen, 8:29 AM)       Right Left   Pressure 14 15         Pupils       Pupils APD   Right PERRL None   Left PERRL None         Visual Fields       Left Right    Full Full         Extraocular Movement       Right Left    Full, Ortho Full, Ortho         Dilation     Both eyes: 1.0% Mydriacyl, 2.5% Phenylephrine @ 8:29 AM           Slit Lamp and Fundus Exam     Slit Lamp Exam       Right Left   Lens 2+ Nuclear sclerosis 2+ Nuclear sclerosis         Fundus Exam       Right Left   Posterior Vitreous Central vitreous floaters, Vitreous hemorrhage Central vitreous floaters, Vitreous hemorrhage   Disc Normal Normal   C/D Ratio 0.5 0.5   Macula Normal Normal   Vessels Normal Normal   Periphery  Normal            IMAGING AND PROCEDURES  Imaging and Procedures for 10/28/21  OCT, Retina - OU - Both Eyes       Right Eye Quality was borderline. Progression has no prior data. Findings include normal foveal contour.   Left Eye Quality was good. Central Foveal Thickness: 275.  Progression has no prior data. Findings include normal foveal contour, vitreomacular adhesion .   Notes Media opacity centrally OD      Color Fundus Photography Optos - OU - Both Eyes       Right Eye Progression has no prior data. Vessels : normal observations.   Left Eye Progression has no prior data. Disc findings include normal observations. Macula : normal observations. Vessels : normal observations. Periphery : normal observations.   Notes Moderate dense central vitreous hemorrhage covering over the macular region.  This likely is from a recent PVD triggered by a sudden deceleration injury from a fall backwards, OD.  No holes or tears      B-Scan Ultrasound - OD - Right Eye       Quality was good. Findings included vitreous hemorrhage, vitreous opacities.   Notes No retinal holes or tears seen on careful peripheral and posterior examination              ASSESSMENT/PLAN:  Vitreomacular adhesion of left eye Incidental finding with micro CME temporally.  May have MAC-TEL  Vitreous hemorrhage of right eye ([place]) Occurrence after sudden deceleration injury triggered by an accidental fall falling backwards with sudden deceleration of the head striking tool.  Occurrence of condition.  It is cleared today according to patient.  He continues with head of bed elevated at night as well.  The nature of the vitreous hemorrhage was discussed with the patient as well as the common causes.   Patients with diabetic eye disease may develop retinal neovascularization.  Other eye conditions develop retinal  neovascularization secondary to retinal venous occlusions.   Vitreous hemorrhage may result from spontaneous vitreous detachment or retinal breaks.  Blunt trauma is a common cause as well.  The need for serial evaluation of the fundus (interior of the eye) until  clear views are obtained was addressed. An occasional need  to monitor the condition by in office  B-scan  ultrasonography in the case of dense vitreous hemorrhage was discussed.  Nuclear sclerotic cataract of both eyes Follow-up Dr. Katy Fitch as scheduled  Cystoid macular edema of left eye Will evaluate in the future if required.  No impact on acuity at this time     ICD-10-CM   1. Vitreous hemorrhage of right eye (HCC)  H43.11 Color Fundus Photography Optos - OU - Both Eyes    B-Scan Ultrasound - OD - Right Eye    2. Vitreomacular adhesion of left eye  H43.822 OCT, Retina - OU - Both Eyes    3. Nuclear sclerotic cataract of both eyes  H25.13     4. Cystoid macular edema of left eye  H35.352       1.  OD, with localized central visual axis vitreous hemorrhage likely from recent PVD and a micro leak from a vascular microleak , no holes or tears identified with careful good evaluation of the peripheral retina.  Inferior retina is somewhat obscured.  B-scan ultrasound of the globe OD, confirms the absence of retinal hole tear or detachment  2.  Head of bed elevated  3.  Ophthalmic Meds Ordered this visit:  No orders of the defined types were placed in this encounter.      Return in about 2 weeks (around 11/11/2021) for dilate, OD, COLOR FP, OCT, possible B-scan OD.  There are no Patient Instructions on file for this visit.   Explained the diagnoses, plan, and follow up with the patient and they expressed understanding.  Patient expressed  understanding of the importance of proper follow up care.   Clent Demark Clarence Dunsmore M.D. Diseases & Surgery of the Retina and Vitreous Retina & Diabetic [place] 10/28/21     Abbreviations: M myopia (nearsighted); A astigmatism; H hyperopia (farsighted); P presbyopia; Mrx spectacle prescription;  CTL contact lenses; OD right eye; OS left eye; OU both eyes  XT exotropia; ET esotropia; PEK punctate epithelial keratitis; PEE punctate epithelial erosions; DES dry eye syndrome; MGD meibomian gland dysfunction; ATs artificial tears; PFAT's preservative free  artificial tears; [place] nuclear sclerotic cataract; PSC posterior subcapsular cataract; ERM epi-retinal membrane; PVD posterior vitreous detachment; RD retinal detachment; DM diabetes mellitus; DR diabetic retinopathy; NPDR non-proliferative diabetic retinopathy; PDR proliferative diabetic retinopathy; CSME clinically significant macular edema; DME diabetic macular edema; dbh dot blot hemorrhages; CWS cotton wool spot; POAG primary open angle glaucoma; C/D cup-to-disc ratio; HVF humphrey visual field; GVF goldmann visual field; OCT optical coherence tomography; IOP intraocular pressure; BRVO Branch retinal vein occlusion; CRVO central retinal vein occlusion; CRAO central retinal artery occlusion; BRAO branch retinal artery occlusion; RT retinal tear; SB scleral buckle; PPV pars plana vitrectomy; VH Vitreous hemorrhage; PRP panretinal laser photocoagulation; IVK intravitreal kenalog; VMT vitreomacular traction; MH Macular hole;  NVD neovascularization of the disc; NVE neovascularization elsewhere; AREDS age related eye disease study; ARMD age related macular degeneration; POAG primary open angle glaucoma; EBMD epithelial/anterior basement membrane dystrophy; ACIOL anterior chamber intraocular lens; IOL intraocular lens; PCIOL posterior chamber intraocular lens; Phaco/IOL phacoemulsification with intraocular lens placement; [place] photorefractive keratectomy; LASIK laser assisted in situ keratomileusis; HTN hypertension; DM diabetes mellitus; COPD chronic obstructive pulmonary disease

## 2021-10-28 NOTE — Assessment & Plan Note (Signed)
Occurrence after sudden deceleration injury triggered by an accidental fall falling backwards with sudden deceleration of the head striking tool.  Occurrence of condition.  It is cleared today according to patient.  He continues with head of bed elevated at night as well.  The nature of the vitreous hemorrhage was discussed with the patient as well as the common causes.   Patients with diabetic eye disease may develop retinal neovascularization.  Other eye conditions develop retinal  neovascularization secondary to retinal venous occlusions.   Vitreous hemorrhage may result from spontaneous vitreous detachment or retinal breaks.  Blunt trauma is a common cause as well.  The need for serial evaluation of the fundus (interior of the eye) until  clear views are obtained was addressed. An occasional need  to monitor the condition by in office  B-scan ultrasonography in the case of dense vitreous hemorrhage was discussed.

## 2021-10-28 NOTE — Assessment & Plan Note (Signed)
Incidental finding with micro CME temporally.  May have MAC-TEL

## 2021-11-05 ENCOUNTER — Ambulatory Visit (INDEPENDENT_AMBULATORY_CARE_PROVIDER_SITE_OTHER): Payer: Medicare Other | Admitting: Ophthalmology

## 2021-11-05 DIAGNOSIS — H43811 Vitreous degeneration, right eye: Secondary | ICD-10-CM | POA: Diagnosis not present

## 2021-11-05 DIAGNOSIS — H4311 Vitreous hemorrhage, right eye: Secondary | ICD-10-CM | POA: Diagnosis not present

## 2021-11-05 DIAGNOSIS — H2513 Age-related nuclear cataract, bilateral: Secondary | ICD-10-CM

## 2021-11-05 NOTE — Assessment & Plan Note (Signed)
Follow-up with [facility] as scheduled

## 2021-11-05 NOTE — Patient Instructions (Signed)
Patient to report profound worsening of symptoms.

## 2021-11-05 NOTE — Assessment & Plan Note (Signed)
Clearing nicely OD.  No sign of retinal holes or tears confirmed today with view to the ora serrata

## 2021-11-05 NOTE — Progress Notes (Signed)
11/05/2021     CHIEF COMPLAINT Patient presents for  Chief Complaint  Patient presents with   Retina Evaluation      HISTORY OF PRESENT ILLNESS: Jacob Small is a 66 y.o. male who presents to the clinic today for:   HPI   Vitreous hemorrhage of right eye WIP- still seeing "spaghetti" over right eye Pt states his vision has been stable Pt admits to floaters Pt denies any FOL Pt states looks like spaghetti hanging down his right eye  Last edited by Morene Rankins, CMA on 11/05/2021  8:53 AM.      Referring physician: Clent Jacks, MD [address] [place],  [place] [address]  HISTORICAL INFORMATION:   Selected notes from the MEDICAL RECORD NUMBER    Lab Results  Component Value Date   HGBA1C 5.8 (H) 09/23/2021     CURRENT MEDICATIONS: No current outpatient medications on file. (Ophthalmic Drugs)   No current facility-administered medications for this visit. (Ophthalmic Drugs)   Current Outpatient Medications (Other)  Medication Sig   amLODipine (NORVASC) 5 MG tablet TAKE 1 TABLET BY MOUTH EVERY DAY   aspirin EC 81 MG tablet Take 1 tablet (81 mg total) by mouth daily.   carvedilol (COREG) 6.25 MG tablet TAKE 1 TABLET BY MOUTH 2 TIMES DAILY WITH A MEAL.   losartan-hydrochlorothiazide (HYZAAR) 100-25 MG tablet Take 1 tablet by mouth daily. Take 1 tablet by mouth daily.   rosuvastatin (CRESTOR) 5 MG tablet TAKE 1 TABLET BY MOUTH THREE TIMES A WEEK.   No current facility-administered medications for this visit. (Other)      REVIEW OF SYSTEMS: ROS   Negative for: Constitutional, Gastrointestinal, Neurological, Skin, Genitourinary, Musculoskeletal, HENT, Endocrine, Cardiovascular, Eyes, Respiratory, Psychiatric, Allergic/Imm, Heme/Lymph Last edited by Morene Rankins, CMA on 11/05/2021  8:53 AM.       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   LIMA to LAD, SVG to PDA   S/P thoracic aortic aneurysm repair 09/22/2010   Valve-sparing aortic root replacement ([place] Type I)   Past Surgical History:  Procedure Laterality Date   AORTIC ROOT REPLACEMENT  09/22/2010   Median sternotomy for valve-sparing aortic root replacemnt Shanon Brow I reimplanation technique) with resection of engrafting of the ascending thoracic aorta .Partial circulatoru arrest, coronaru aartery bypass grafting X2 (left intrnal mammary artery to distal left anterior descending coronary artery, saphenous vein graft to posterrior descending coronary artery, EVH rt thigh   CORONARY ARTERY BYPASS GRAFT  09/22/2010   CABGx2 with LIMA to LAD, SVG to PDA   CORONARY ARTERY BYPASS GRAFT     CYSTOSCOPY WITH INSERTION OF UROLIFT N/A  03/01/2019   Procedure: CYSTOSCOPY WITH INSERTION OF UROLIFT;  Surgeon: Royston Cowper, MD;  Location: [facility] ORS;  Service: Urology;  Laterality: N/A;   EXTERNAL EAR SURGERY     HAND SURGERY     KNEE DEBRIDEMENT     02/2008 CHONDROPLASTY/DEB. OF MENISCUS   PILONIDAL CYST EXCISION     PROSTATE SURGERY  09/30/10   BX  2010 NEG    FAMILY HISTORY Family History  Problem Relation Age of Onset   Heart disease Father    Cancer Brother        skin    SOCIAL HISTORY Social History   Tobacco Use   Smoking status: Former    Types: Cigarettes    Quit date:  02/16/1995    Years since quitting: 26.7   Smokeless tobacco: Never  Vaping Use   Vaping Use: Never used  Substance Use Topics   Alcohol use: Not Currently   Drug use: Never         OPHTHALMIC EXAM:  Base Eye Exam     Visual Acuity (ETDRS)       Right Left   Dist [place] 20/25 20/25 -1         Tonometry (Tonopen, 8:55 AM)       Right Left   Pressure 7 14         Pupils       Pupils   Right PERRL   Left PERRL         Visual Fields       Left Right    Full Full         Extraocular Movement       Right Left    Ortho Ortho    -- -- --  --  --  -- -- --   -- -- --  --  --  -- -- --           Dilation     Right eye: 2.5% Phenylephrine, 1.0% Mydriacyl @ 8:54 AM           Slit Lamp and Fundus Exam     External Exam       Right Left   External Normal Normal         Slit Lamp Exam       Right Left   Lids/Lashes Normal Normal   Conjunctiva/Sclera White and quiet White and quiet   Cornea Clear Clear   Anterior Chamber Deep and quiet Deep and quiet   [place] Round and reactive Round and reactive   Lens 2+ Nuclear sclerosis 2+ Nuclear sclerosis   Anterior Vitreous Central vitreous floaters, Vitreous hemorrhage Central vitreous floaters, Vitreous hemorrhage         Fundus Exam       Right Left   Posterior Vitreous Posterior vitreous detachment, Vitreous hemorrhage inferiorly now. Normal   Disc Normal Normal   C/D Ratio 0.5 0.5   Macula Normal Normal   Vessels Normal Normal   Periphery Normal, no holes or tears, 2825 D examination [place] to the ora Normal            IMAGING AND PROCEDURES  Imaging and Procedures for 11/09/21  Color Fundus Photography Optos - OU - Both Eyes       Right Eye Progression has no prior data. Vessels : normal observations.   Left Eye Progression has no prior data. Disc findings include normal observations. Macula : normal observations. Vessels : normal observations. Periphery : normal  observations.   Notes Originally Moderate dense central vitreous hemorrhage covering over the macular region now clearing from visual axis.  This likely is from a recent PVD triggered by a sudden deceleration injury from a fall backwards, OD.  No holes or tears             ASSESSMENT/PLAN:  Vitreous hemorrhage of right eye ([place]) Clearing nicely OD.  No sign of retinal holes or tears confirmed today with view to the ora serrata  Posterior vitreous detachment of right eye Recent vitreous hemorrhage located mostly in the posterior pole likely from PVD and a small capillary breakage as the Weiss ring is now visible as a hemorrhages settled inferiorly.  Peripheral examination confirms the absence of retinal hole or tear in the periphery today.  Patient continues to monitor  Nuclear sclerotic cataract of both eyes Follow-up with [facility] as scheduled     ICD-10-CM   1. Vitreous hemorrhage of right eye ([place])  H43.11 Color Fundus Photography Optos - OU - Both Eyes    2. Posterior vitreous detachment of right eye  H43.811     3. Nuclear sclerotic cataract of both eyes  H25.13       1.  PVD OD, accompanied by vitreous hemorrhage.  Hemorrhages now selling inferiorly.  No retinal holes or tears with clear view 360 degrees to the retinal periphery today with 25 and 28 D examinations  2.  3.  Ophthalmic Meds Ordered this visit:  No orders of the defined types were placed in this encounter.      Return if symptoms worsen or fail to improve, for Follow-up Dr. Katy Fitch as scheduled October.  Patient Instructions  Patient to report profound worsening of symptoms.   Explained the diagnoses, plan, and follow up with the patient and they expressed understanding.  Patient expressed understanding of the importance of proper follow up care.   Clent Demark Mishon Blubaugh M.D. Diseases & Surgery of the Retina and Vitreous Retina & Diabetic [place] 11/09/21     Abbreviations: M myopia  (nearsighted); A astigmatism; H hyperopia (farsighted); P presbyopia; Mrx spectacle prescription;  CTL contact lenses; OD right eye; OS left eye; OU both eyes  XT exotropia; ET esotropia; PEK punctate epithelial keratitis; PEE punctate epithelial erosions; DES dry eye syndrome; MGD meibomian gland dysfunction; ATs artificial tears; PFAT's preservative free artificial tears; [place] nuclear sclerotic cataract; PSC posterior subcapsular cataract; ERM epi-retinal membrane; PVD posterior vitreous detachment; RD retinal detachment; DM diabetes mellitus; DR diabetic retinopathy; NPDR non-proliferative diabetic retinopathy; PDR proliferative diabetic retinopathy; CSME clinically significant macular edema; DME diabetic macular edema; dbh dot blot hemorrhages; CWS cotton wool spot; POAG primary open angle glaucoma; C/D cup-to-disc ratio; HVF humphrey visual field; GVF goldmann visual field; OCT optical coherence tomography; IOP intraocular pressure; BRVO Branch retinal vein occlusion; CRVO central retinal vein occlusion; CRAO central retinal artery occlusion; BRAO branch retinal artery occlusion; RT retinal tear; SB scleral buckle; PPV pars plana vitrectomy; VH Vitreous hemorrhage; PRP panretinal laser photocoagulation; IVK intravitreal kenalog; VMT vitreomacular traction; MH Macular hole;  NVD neovascularization of the disc; NVE neovascularization elsewhere; AREDS age related eye disease study; ARMD age related macular degeneration; POAG primary open angle glaucoma; EBMD epithelial/anterior basement membrane dystrophy; ACIOL anterior chamber intraocular lens; IOL intraocular lens; PCIOL posterior chamber intraocular lens; Phaco/IOL phacoemulsification with intraocular lens placement; [place] photorefractive keratectomy; LASIK laser assisted in situ keratomileusis; HTN hypertension; DM diabetes mellitus; COPD chronic obstructive pulmonary disease

## 2021-11-05 NOTE — Assessment & Plan Note (Signed)
Recent vitreous hemorrhage located mostly in the posterior pole likely from PVD and a small capillary breakage as the Weiss ring is now visible as a hemorrhages settled inferiorly.  Peripheral examination confirms the absence of retinal hole or tear in the periphery today.  Patient continues to monitor

## 2021-11-09 ENCOUNTER — Encounter (INDEPENDENT_AMBULATORY_CARE_PROVIDER_SITE_OTHER): Payer: Self-pay | Admitting: Ophthalmology

## 2021-11-11 ENCOUNTER — Encounter (INDEPENDENT_AMBULATORY_CARE_PROVIDER_SITE_OTHER): Payer: Medicare Other | Admitting: Ophthalmology

## 2021-11-16 ENCOUNTER — Other Ambulatory Visit: Payer: Self-pay | Admitting: Interventional Cardiology

## 2022-10-04 ENCOUNTER — Other Ambulatory Visit: Payer: Self-pay

## 2022-10-04 MED ORDER — CARVEDILOL 6.25 MG PO TABS: 6.2500 mg | ORAL_TABLET | Freq: Two times a day (BID) | ORAL | 0 refills | Status: DC

## 2022-11-03 ENCOUNTER — Other Ambulatory Visit: Payer: Self-pay

## 2022-11-03 MED ORDER — ROSUVASTATIN CALCIUM 5 MG PO TABS: ORAL_TABLET | ORAL | 0 refills | Status: DC

## 2022-11-04 NOTE — Progress Notes (Signed)
Cardiology Office Note   Date:  11/05/2022   ID:  Jacob Small, DOB 02-12-1956, MRN [id number]  PCP:  Jacob Heys, MD    No chief complaint on file.  CAD  Wt Readings from Last 3 Encounters:  11/05/22 (!) 320 lb (145.2 kg)  09/23/21 (!) 329 lb 9.6 oz (149.5 kg)  08/25/20 (!) 331 lb 3.2 oz (150.2 kg)       History of Present Illness: Jacob Small is a 67 y.o. male  former patient of Jacob Small with a hx of essential hypertension, hyperlipidemia, prior myocardial infarction, morbid obesity, bilateral hearing loss, bicuspid aortic valve, status post aneurysm repair and valve sparing aortic root replacement 2012, CAD with CABG (LIMA to LAD and SVG to PDA) 2012.   Denies : Chest pain. Dizziness. Leg edema. Nitroglycerin use. Orthopnea. Palpitations. Paroxysmal nocturnal dyspnea. Shortness of breath. Syncope.    He had been flipping houses.  Slowed down.  Working is his most frequent activity. Does not walk much.   Admits to a poor diet- "pizza, stromboli, hamburgers, hot dogs."  He wants to stay of of blood thinner.     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)    Past Surgical History:  Procedure Laterality Date   AORTIC ROOT REPLACEMENT  09/22/2010   Median sternotomy for valve-sparing aortic root replacemnt Jacob Small I reimplanation technique) with resection of engrafting of the ascending thoracic aorta .Partial circulatoru arrest, coronaru aartery bypass  grafting X2 (left intrnal mammary artery to distal left anterior descending coronary artery, saphenous vein graft to posterrior descending coronary artery, EVH rt thigh   CORONARY ARTERY BYPASS GRAFT  09/22/2010   CABGx2 with LIMA to LAD, SVG to PDA   CORONARY ARTERY BYPASS GRAFT     CYSTOSCOPY WITH INSERTION OF UROLIFT N/A 03/01/2019   Procedure: CYSTOSCOPY WITH INSERTION OF UROLIFT;  Surgeon: Jacob Ape, MD;  Location: [facility] ORS;  Service: Urology;  Laterality: N/A;   EXTERNAL EAR SURGERY     HAND SURGERY     KNEE DEBRIDEMENT     02/2008 CHONDROPLASTY/DEB. OF MENISCUS   PILONIDAL CYST EXCISION     PROSTATE SURGERY  09/30/10   BX  2010 NEG     Current Outpatient Medications  Medication Sig Dispense Refill   amLODipine (NORVASC) 5 MG tablet TAKE 1 TABLET BY MOUTH EVERY DAY 90 tablet 3   aspirin EC 81 MG tablet Take 1 tablet (81 mg total) by mouth daily. 90 tablet 3   Azelaic Acid 15 % gel      carvedilol (COREG) 6.25 MG tablet Take 1 tablet (6.25 mg total) by mouth 2 (two) times daily with a meal. Please keep upcoming appt.with Jacob Small on 9/20 to receive future refills. 180 tablet 0   doxycycline (VIBRAMYCIN) 100 MG capsule Take 100 mg by mouth 2 (two) times daily.     fluocinonide  cream (LIDEX) 0.05 %      ketoconazole (NIZORAL) 2 % shampoo Apply topically.     losartan-hydrochlorothiazide (HYZAAR) 100-25 MG tablet TAKE 1 TABLET BY MOUTH EVERY DAY 90 tablet 3   rosuvastatin (CRESTOR) 5 MG tablet TAKE 1 TABLET BY MOUTH THREE TIMES A WEEK 36 tablet 0   No current facility-administered medications for this visit.    Allergies:   Lisinopril, Toprol xl [metoprolol succinate], Lisinopril, Metoprolol, Other, and Rosuvastatin    Social History:  The patient  reports that he quit smoking about 27 years ago. He has never used smokeless tobacco. He reports that he does not currently use alcohol. He reports that he does not use drugs.   Family History:  The patient's family history  includes Cancer in his brother; Heart disease in his father.    ROS:  Please see the history of present illness.   Otherwise, review of systems are positive for weight loss.   All other systems are reviewed and negative.    PHYSICAL EXAM: VS:  BP (!) 130/90   Pulse 83   Ht 6' 2.5" (1.892 m)   Wt (!) 320 lb (145.2 kg)   SpO2 95%   BMI 40.54 kg/m  , BMI Body mass index is 40.54 kg/m. GEN: Well nourished, well developed, in no acute distress HEENT: normal Neck: no JVD, carotid bruits, or masses Cardiac: RRR; no murmurs, rubs, or gallops,; tr LE edema  Respiratory:  clear to auscultation bilaterally, normal work of breathing GI: soft, nontender, nondistended, + BS MS: no deformity or atrophy Skin: warm and dry, no rash Neuro:  Strength and sensation are intact Psych: euthymic mood, full affect   EKG:   The ekg ordered today demonstrates AFib, PVC, rate controlled   Recent Labs: No results found for requested labs within last 365 days.   Lipid Panel    Component Value Date/Time   CHOL 125 09/23/2021 0919   TRIG 145 09/23/2021 0919   HDL 26 (L) 09/23/2021 0919   CHOLHDL 4.8 09/23/2021 0919   LDLCALC 73 09/23/2021 0919     Other studies Reviewed: Additional studies/ records that were reviewed today with results demonstrating: labs reviewed.   ASSESSMENT AND PLAN:  CAD: Status post CABG with aorta surgery in 2012.   AFib: Recurrent- last episode was in 2012. Asymptomatic.  Thinks he had a DCCV in 2012.  I recommended starting Eliquis for stroke prevention in place of aspirin. He is agreeable to try Eliquis 5 mg BID. Rate controlled.  Bicuspid aortic valve: Valve sparing aortic surgery.  Last echo in August 2023 did not show evidence of aortic stenosis. Hyperlipidemia: Whole food, plant-based diet.  High-fiber diet.  Avoid processed foods. Hypertension: Avoid excessive salt.  COntinue current meds.  LVH: Moderate by 2023 echo.  Appears euvolemic.  Prediabetes:  Increasing activity and improving diet would be helpful.     Current medicines are reviewed at length with the patient today.  The patient concerns regarding his medicines were addressed.  The following changes have been made:  stop aspirin, start Eliquis  Labs/ tests ordered today include: CBC, CMet, lipid panel, TSH  Orders Placed This Encounter  Procedures   EKG 12-Lead    Recommend 150 minutes/week of aerobic exercise Low fat, low carb, high fiber diet recommended  Disposition:   FU in 6 weeks with APP.  Long term f/u with Dr. Floy Sabina or Dr. AT.   Signed, Lance Muss, MD  11/05/2022 8:17 AM    [facility] [address] [place], [place], [place]  [address] Phone: [phone]; Fax: [phone]

## 2022-11-05 ENCOUNTER — Ambulatory Visit: Payer: Medicare Other | Attending: Interventional Cardiology | Admitting: Interventional Cardiology

## 2022-11-05 ENCOUNTER — Encounter: Payer: Self-pay | Admitting: Interventional Cardiology

## 2022-11-05 VITALS — BP 130/90 | HR 83 | Ht 74.5 in | Wt 320.0 lb

## 2022-11-05 DIAGNOSIS — E782 Mixed hyperlipidemia: Secondary | ICD-10-CM | POA: Diagnosis not present

## 2022-11-05 DIAGNOSIS — I517 Cardiomegaly: Secondary | ICD-10-CM

## 2022-11-05 DIAGNOSIS — Q231 Congenital insufficiency of aortic valve: Secondary | ICD-10-CM | POA: Diagnosis not present

## 2022-11-05 DIAGNOSIS — Z8679 Personal history of other diseases of the circulatory system: Secondary | ICD-10-CM

## 2022-11-05 DIAGNOSIS — I25709 Atherosclerosis of coronary artery bypass graft(s), unspecified, with unspecified angina pectoris: Secondary | ICD-10-CM | POA: Diagnosis not present

## 2022-11-05 DIAGNOSIS — Z9889 Other specified postprocedural states: Secondary | ICD-10-CM

## 2022-11-05 DIAGNOSIS — I4891 Unspecified atrial fibrillation: Secondary | ICD-10-CM

## 2022-11-05 DIAGNOSIS — I10 Essential (primary) hypertension: Secondary | ICD-10-CM | POA: Diagnosis not present

## 2022-11-05 MED ORDER — AMLODIPINE BESYLATE 5 MG PO TABS: 5.0000 mg | ORAL_TABLET | Freq: Every day | ORAL | 3 refills | Status: DC

## 2022-11-05 MED ORDER — APIXABAN 5 MG PO TABS: 5.0000 mg | ORAL_TABLET | Freq: Two times a day (BID) | ORAL | 4 refills | Status: DC

## 2022-11-05 MED ORDER — CARVEDILOL 6.25 MG PO TABS: 6.2500 mg | ORAL_TABLET | Freq: Two times a day (BID) | ORAL | 3 refills | Status: DC

## 2022-11-05 MED ORDER — APIXABAN 5 MG PO TABS: 5.0000 mg | ORAL_TABLET | Freq: Two times a day (BID) | ORAL | 0 refills | Status: DC

## 2022-11-05 MED ORDER — LOSARTAN POTASSIUM-HCTZ 100-25 MG PO TABS: 1.0000 | ORAL_TABLET | Freq: Every day | ORAL | 3 refills | Status: DC

## 2022-11-05 MED ORDER — ROSUVASTATIN CALCIUM 5 MG PO TABS: ORAL_TABLET | ORAL | 3 refills | Status: DC

## 2022-11-05 NOTE — Patient Instructions (Signed)
Medication Instructions:  Your physician has recommended you make the following change in your medication:  Stop aspirin Start Eliquis 5 mg by mouth twice daily   *If you need a refill on your cardiac medications before your next appointment, please call your pharmacy*   Lab Work: Lab work to be done today--CBC, CMET, Lipids, TSH, A1C If you have labs (blood work) drawn today and your tests are completely normal, you will receive your results only by: MyChart Message (if you have MyChart) OR A paper copy in the mail If you have any lab test that is abnormal or we need to change your treatment, we will call you to review the results.   Testing/Procedures: none   Follow-Up: At [facility], you and your health needs are our priority.  As part of our continuing mission to provide you with exceptional heart care, we have created designated Provider Care Teams.  These Care Teams include your primary Cardiologist (physician) and Advanced Practice Providers (APPs -  Physician Assistants and Nurse Practitioners) who all work together to provide you with the care you need, when you need it.  We recommend signing up for the patient portal called "MyChart".  Sign up information is provided on this After Visit Summary.  MyChart is used to connect with patients for Virtual Visits (Telemedicine).  Patients are able to view lab/test results, encounter notes, upcoming appointments, etc.  Non-urgent messages can be sent to your provider as well.   To learn more about what you can do with MyChart, go to [email or web address].    Your next appointment:   6 week(s)  Provider:   Jari Favre, PA-C, Ronie Spies, PA-C, Robin Searing, NP, Jacolyn Reedy, PA-C, Eligha Bridegroom, NP, Tereso Newcomer, PA-C, or Perlie Gold, PA-C         Other Instructions

## 2022-11-06 LAB — COMPREHENSIVE METABOLIC PANEL
ALT: 17 IU/L (ref 0–44)
AST: 18 IU/L (ref 0–40)
Albumin: 4.5 g/dL (ref 3.9–4.9)
Alkaline Phosphatase: 95 IU/L (ref 44–121)
BUN/Creatinine Ratio: 16 (ref 10–24)
BUN: 15 mg/dL (ref 8–27)
Bilirubin Total: 2 mg/dL — ABNORMAL HIGH (ref 0.0–1.2)
CO2: 27 mmol/L (ref 20–29)
Calcium: 9.9 mg/dL (ref 8.6–10.2)
Chloride: 101 mmol/L (ref 96–106)
Creatinine, Ser: 0.94 mg/dL (ref 0.76–1.27)
Globulin, Total: 2.9 g/dL (ref 1.5–4.5)
Glucose: 101 mg/dL — ABNORMAL HIGH (ref 70–99)
Potassium: 4.2 mmol/L (ref 3.5–5.2)
Sodium: 143 mmol/L (ref 134–144)
Total Protein: 7.4 g/dL (ref 6.0–8.5)
eGFR: 89 mL/min/{1.73_m2} (ref >59)

## 2022-11-06 LAB — CBC
Hematocrit: 47.2 % (ref 37.5–51.0)
Hemoglobin: 15.5 g/dL (ref 13.0–17.7)
MCH: 29 pg (ref 26.6–33.0)
MCHC: 32.8 g/dL (ref 31.5–35.7)
MCV: 88 fL (ref 79–97)
Platelets: 192 10*3/uL (ref 150–450)
RBC: 5.35 x10E6/uL (ref 4.14–5.80)
RDW: 13.7 % (ref 11.6–15.4)
WBC: 9.2 10*3/uL (ref 3.4–10.8)

## 2022-11-06 LAB — LIPID PANEL
Chol/HDL Ratio: 4.4 ratio (ref 0.0–5.0)
Cholesterol, Total: 120 mg/dL (ref 100–199)
HDL: 27 mg/dL — ABNORMAL LOW (ref >39)
LDL Chol Calc (NIH): 69 mg/dL (ref 0–99)
Triglycerides: 131 mg/dL (ref 0–149)
VLDL Cholesterol Cal: 24 mg/dL (ref 5–40)

## 2022-11-06 LAB — HEMOGLOBIN A1C
Est. average glucose Bld gHb Est-mCnc: 114 mg/dL
Hgb A1c MFr Bld: 5.6 % (ref 4.8–5.6)

## 2022-11-06 LAB — TSH: TSH: 1.77 u[IU]/mL (ref 0.450–4.500)

## 2022-11-17 ENCOUNTER — Telehealth: Payer: Self-pay | Admitting: Interventional Cardiology

## 2022-11-17 NOTE — Telephone Encounter (Signed)
Pt is requesting a callback regarding him wanting to know what he can and can't take while being on blood thinners. Please advise

## 2022-11-17 NOTE — Telephone Encounter (Signed)
I spoke with patient and told him it was OK to take tylenol and to avoid aspirin and NSAIDs.  Patient made aware he should not take Ibuprofen. Patient is asking about donating blood and I told him he cannot donate blood due to taking Eliquis.

## 2022-12-17 ENCOUNTER — Ambulatory Visit: Payer: Medicare Other | Attending: Physician Assistant | Admitting: Physician Assistant

## 2022-12-17 ENCOUNTER — Encounter: Payer: Self-pay | Admitting: Physician Assistant

## 2022-12-17 VITALS — BP 118/82 | HR 83 | Ht 75.0 in | Wt 322.8 lb

## 2022-12-17 DIAGNOSIS — R7303 Prediabetes: Secondary | ICD-10-CM

## 2022-12-17 DIAGNOSIS — Z9889 Other specified postprocedural states: Secondary | ICD-10-CM

## 2022-12-17 DIAGNOSIS — I4891 Unspecified atrial fibrillation: Secondary | ICD-10-CM

## 2022-12-17 DIAGNOSIS — I517 Cardiomegaly: Secondary | ICD-10-CM | POA: Diagnosis not present

## 2022-12-17 DIAGNOSIS — Q2381 Bicuspid aortic valve: Secondary | ICD-10-CM | POA: Diagnosis not present

## 2022-12-17 DIAGNOSIS — I10 Essential (primary) hypertension: Secondary | ICD-10-CM

## 2022-12-17 DIAGNOSIS — I25709 Atherosclerosis of coronary artery bypass graft(s), unspecified, with unspecified angina pectoris: Secondary | ICD-10-CM

## 2022-12-17 DIAGNOSIS — E782 Mixed hyperlipidemia: Secondary | ICD-10-CM

## 2022-12-17 DIAGNOSIS — Z8679 Personal history of other diseases of the circulatory system: Secondary | ICD-10-CM

## 2022-12-17 MED ORDER — APIXABAN 5 MG PO TABS: 5.0000 mg | ORAL_TABLET | Freq: Two times a day (BID) | ORAL | 3 refills | Status: DC

## 2022-12-17 NOTE — Patient Instructions (Signed)
Medication Instructions:   Your physician recommends that you continue on your current medications as directed. Please refer to the Current Medication list given to you today.   *If you need a refill on your cardiac medications before your next appointment, please call your pharmacy*   Lab Work:  None ordered.  If you have labs (blood work) drawn today and your tests are completely normal, you will receive your results only by: MyChart Message (if you have MyChart) OR A paper copy in the mail If you have any lab test that is abnormal or we need to change your treatment, we will call you to review the results.   Testing/Procedures:  None ordered.   Follow-Up: At [facility], you and your health needs are our priority.  As part of our continuing mission to provide you with exceptional heart care, we have created designated Provider Care Teams.  These Care Teams include your primary Cardiologist (physician) and Advanced Practice Providers (APPs -  Physician Assistants and Nurse Practitioners) who all work together to provide you with the care you need, when you need it.  We recommend signing up for the patient portal called "MyChart".  Sign up information is provided on this After Visit Summary.  MyChart is used to connect with patients for Virtual Visits (Telemedicine).  Patients are able to view lab/test results, encounter notes, upcoming appointments, etc.  Non-urgent messages can be sent to your provider as well.   To learn more about what you can do with MyChart, go to [email or web address].    Your next appointment:   3 month(s)  Provider:   Jari Favre, PA-C

## 2023-03-03 ENCOUNTER — Ambulatory Visit: Payer: Medicare Other | Admitting: Physician Assistant

## 2023-03-24 NOTE — Progress Notes (Signed)
 Cardiology Office Note:  .   Date:  03/25/2023  ID:  Jacob Small, DOB Aug 25, 1955, MRN [id number] PCP: Jacob Opal, DO  [facility] Providers Cardiologist:  Candyce Reek, MD {  History of Present Illness: .   Jacob Small is a 68 y.o. male with a past medical history of hypertension, hyperlipidemia, prior myocardial infarction, morbid obesity, bilateral hearing loss, bicuspid aortic valve, status post aortic aneurysm repair and valve sparing aortic root replacement in 2012, CAD with CABG (LIMA to LAD and SVG to PDA) in 2012 here for follow-up appointment.  Was last seen 11/05/2022.  He still works in engineer, technical sales houses.  Admitted to a poor diet of pizza, [place], hamburgers, and hot dogs.  He is wanting to stay off of a blood thinner.  I saw him 12/17/22, he presents for a routine follow-up. He reports no symptoms of atrial fibrillation, such as fluttering or palpitations, and has been adhering to his prescribed medication regimen, including Eliquis . He has been active, managing rental properties, despite the stress and physical demands of the work. He has a history of heart surgery, after which he experienced a significant episode of atrial fibrillation that required cardioversion. He has been maintaining a normal blood pressure, which he monitors regularly at home.  Reports no shortness of breath nor dyspnea on exertion. Reports no chest pain, pressure, or tightness. No edema, orthopnea, PND. Reports no palpitations.   Today, he presents with a history of asymptomatic atrial fibrillation (AFib) and aortic replacement,  with concerns about the side effects of his anticoagulant medication, Eliquis . The patient reports that he frequently experiences prolonged bleeding from minor cuts due to his work environment, which involves a risk of minor injuries. He also mentions the appearance of small red bumps on his skin, which he had not experienced before starting Eliquis . The patient  expresses a desire to stop taking Eliquis  due to these side effects. He also mentions a history of successful blood donation, which he had to stop due to being on Eliquis . The patient's AFib was discovered incidentally, and he has been asymptomatic. He reports noticing an increase in his resting heart rate from his usual 48-58 beats per minute to around 75-80 beats per minute about four months ago, which he suspects might be due to AFib. The patient denies feeling tired or fatigued  Reports no shortness of breath nor dyspnea on exertion. Reports no chest pain, pressure, or tightness. No edema, orthopnea, PND. Reports no palpitations.   Discussed the use of AI scribe software for clinical note transcription with the patient, who gave verbal consent to proceed.  ROS: Pertinent ROS in HPI  Studies Reviewed: .         Echo 23-Oct-2021 IMPRESSIONS     1. Left ventricular ejection fraction, by estimation, is 60 to 65%. The  left ventricle has normal function. The left ventricle has no regional  wall motion abnormalities. There is moderate left ventricular hypertrophy.  Left ventricular diastolic  parameters were normal.   2. Right ventricular systolic function is normal. The right ventricular  size is normal.   3. Left atrial size was severely dilated.   4. The mitral valve is normal in structure. No evidence of mitral valve  regurgitation. No evidence of mitral stenosis.   5. Leaflets not well seen apparently has bicuspid valve with valve  sparing surgery during root replacement AV sclerosis with mild AR Can  consider TEE/Cardiac CTA if needed in future to assess valve. The aortic  valve was not well visualized. There is  mild calcification of the aortic valve. There is mild thickening of the  aortic valve. Aortic valve regurgitation is mild. Aortic valve sclerosis  is present, with no evidence of aortic valve stenosis.   6. Aortic dilatation noted. There is mild dilatation of the aortic  root,  measuring 41 mm.   7. The inferior vena cava is normal in size with greater than 50%  respiratory variability, suggesting right atrial pressure of 3 mmHg.   FINDINGS   Left Ventricle: Left ventricular ejection fraction, by estimation, is 60  to 65%. The left ventricle has normal function. The left ventricle has no  regional wall motion abnormalities. The left ventricular internal cavity  size was normal in size. There is   moderate left ventricular hypertrophy. Left ventricular diastolic  parameters were normal.   Right Ventricle: The right ventricular size is normal. No increase in  right ventricular wall thickness. Right ventricular systolic function is  normal.   Left Atrium: Left atrial size was severely dilated.   Right Atrium: Right atrial size was normal in size.   Pericardium: There is no evidence of pericardial effusion.   Mitral Valve: The mitral valve is normal in structure. No evidence of  mitral valve regurgitation. No evidence of mitral valve stenosis.   Tricuspid Valve: The tricuspid valve is normal in structure. Tricuspid  valve regurgitation is not demonstrated. No evidence of tricuspid  stenosis.   Aortic Valve: Leaflets not well seen apparently has bicuspid valve with  valve sparing surgery during root replacement AV sclerosis with mild AR  Can consider TEE/Cardiac CTA if needed in future to assess valve. The  aortic valve was not well visualized.  There is mild calcification of the aortic valve. There is mild thickening  of the aortic valve. Aortic valve regurgitation is mild. Aortic  regurgitation PHT measures 356 msec. Aortic valve sclerosis is present,  with no evidence of aortic valve stenosis.  Aortic valve mean gradient measures 12.0 mmHg. Aortic valve peak gradient  measures 22.5 mmHg. Aortic valve area, by VTI measures 2.08 cm.   Pulmonic Valve: The pulmonic valve was normal in structure. Pulmonic valve  regurgitation is not visualized. No  evidence of pulmonic stenosis.   Aorta: Aortic dilatation noted. There is mild dilatation of the aortic  root, measuring 41 mm.   Venous: The inferior vena cava is normal in size with greater than 50%  respiratory variability, suggesting right atrial pressure of 3 mmHg.   IAS/Shunts: No atrial level shunt detected by color flow Doppler.      Risk Assessment/Calculations:    CHA2DS2-VASc Score = 3   This indicates a 3.2% annual risk of stroke. The patient's score is based upon: CHF History: 0 HTN History: 1 Diabetes History: 0 Stroke History: 0 Vascular Disease History: 1 Age Score: 1 Gender Score: 0           Physical Exam:   VS:  BP 120/80 (BP Location: Right Arm, Patient Position: Sitting, Cuff Size: Large)   Pulse 90   Resp 16   Ht 6' 3 (1.905 m)   Wt (!) 322 lb (146.1 kg)   SpO2 97%   BMI 40.25 kg/m    Wt Readings from Last 3 Encounters:  03/25/23 (!) 322 lb (146.1 kg)  12/17/22 (!) 322 lb 12.8 oz (146.4 kg)  11/05/22 (!) 320 lb (145.2 kg)    GEN: Well nourished, well developed in no acute distress NECK: No JVD; No  carotid bruits CARDIAC: IRIR, no murmurs, rubs, gallops RESPIRATORY:  Clear to auscultation without rales, wheezing or rhonchi  ABDOMEN: Soft, non-tender, non-distended EXTREMITIES:  No edema; No deformity   ASSESSMENT AND PLAN: .   Atrial Fibrillation Asymptomatic, rate controlled. Patient is currently on Eliquis  but has concerns about bleeding and would like to discontinue. Discussed the risk of stroke with discontinuation of anticoagulation. CHA2DS2-VASc score is 2, indicating a 3.2% risk of stroke per year. Discussed options including cardioversion and Watchman device. -Continue Eliquis  for now. -Refer to Dr. Cindie or Dr. Inocencio for possible cardioversion.  Hypertension Well controlled. -Continue current management.  Coronary Artery Disease Stable. -Continue current management.  HLD -LDL 69, continue current medication regimen     Dispo: He can follow-up in 4-5 months to establish care with Dr. Wendel  Signed, Orren LOISE Fabry, PA-C

## 2023-03-25 ENCOUNTER — Encounter: Payer: Self-pay | Admitting: Physician Assistant

## 2023-03-25 ENCOUNTER — Ambulatory Visit: Payer: Medicare Other | Admitting: Physician Assistant

## 2023-03-25 ENCOUNTER — Ambulatory Visit: Payer: Medicare Other | Attending: Physician Assistant | Admitting: Physician Assistant

## 2023-03-25 VITALS — BP 120/80 | HR 90 | Resp 16 | Ht 75.0 in | Wt 322.0 lb

## 2023-03-25 DIAGNOSIS — I25709 Atherosclerosis of coronary artery bypass graft(s), unspecified, with unspecified angina pectoris: Secondary | ICD-10-CM | POA: Diagnosis not present

## 2023-03-25 DIAGNOSIS — E782 Mixed hyperlipidemia: Secondary | ICD-10-CM

## 2023-03-25 DIAGNOSIS — I4891 Unspecified atrial fibrillation: Secondary | ICD-10-CM | POA: Diagnosis not present

## 2023-03-25 DIAGNOSIS — I10 Essential (primary) hypertension: Secondary | ICD-10-CM

## 2023-03-25 NOTE — Patient Instructions (Addendum)
 Medication Instructions:  Your physician recommends that you continue on your current medications as directed. Please refer to the Current Medication list given to you today.  *If you need a refill on your cardiac medications before your next appointment, please call your pharmacy*  Lab Work: None ordered.  If you have labs (blood work) drawn today and your tests are completely normal, you will receive your results only by: MyChart Message (if you have MyChart) OR A paper copy in the mail If you have any lab test that is abnormal or we need to change your treatment, we will call you to review the results.  Testing/Procedures: None ordered.  Follow-Up: At [facility], you and your health needs are our priority.  As part of our continuing mission to provide you with exceptional heart care, we have created designated Provider Care Teams.  These Care Teams include your primary Cardiologist (physician) and Advanced Practice Providers (APPs -  Physician Assistants and Nurse Practitioners) who all work together to provide you with the care you need, when you need it.    Your next appointment:   Referral to EP  The format for your next appointment:   In Person  Provider:   Next available in General Cardiology    Important Information About Sugar

## 2023-05-04 ENCOUNTER — Other Ambulatory Visit: Payer: Self-pay | Admitting: Urology

## 2023-05-04 DIAGNOSIS — R972 Elevated prostate specific antigen [PSA]: Secondary | ICD-10-CM

## 2023-05-04 NOTE — H&P (View-Only) (Signed)
 Electrophysiology Office Note:    Date:  05/05/2023   ID:  Jacob Small, DOB 1955-06-21, MRN [id number]  [facility] Cardiologist:  Jacob Muss, MD  [facility] Electrophysiologist:  Jacob Prude, MD   Referring MD: Jacob Dory, PA-C   Chief Complaint: Atrial fibrillation  History of Present Illness:    Jacob Small is a 68 year old man who I am seeing today for an evaluation of atrial fibrillation at the request of Jacob Favre, PA-C.  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  The patient last saw Jacob Small March 25, 2023.  At the last appoint with Jacob Small, the patient reported concern about indefinite use of anticoagulation.  He was interested in left atrial appendage occlusion.  Had a long discussion today about his atrial fibrillation management.  He stays fairly active.  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  He is minimally symptomatic with his atrial fibrillation.      Their past medical, social and family history was reviewed.   ROS:   Please see the history of present illness.    All other systems reviewed and are negative.  EKGs/Labs/Other Studies Reviewed:    The following studies were reviewed today:  December 17, 2022 EKG shows atrial fibrillation  November 05, 2022 EKG shows atrial fibrillation  September 23, 2021 EKG shows sinus rhythm  August 25, 2020 EKG shows sinus rhythm  October 07, 2021 echo shows normal EF, 60%.  Poorly visualized aortic valve.  Mild AI.       Physical Exam:    VS:  BP 110/74 (BP Location: Left Arm, Patient Position: Sitting, Cuff Size: Large)   Pulse 84   Ht 6\' 3"  (1.905 m)   Wt (!) 321 lb 12.8 oz (146 kg)   SpO2 96%   BMI 40.22 kg/m     Wt Readings from Last 3 Encounters:  05/05/23 (!) 321 lb 12.8 oz (146 kg)  03/25/23 (!) 322 lb (146.1 kg)  12/17/22 (!) 322 lb 12.8 oz (146.4 kg)     GEN: no  distress.  Obese CARD: RRR, No MRG RESP: No IWOB. CTAB.        ASSESSMENT AND PLAN:    1. Atrial fibrillation, unspecified type (HCC)   2. Left ventricular hypertrophy   3. Coronary artery disease involving coronary bypass graft of native heart with angina pectoris [facility])     #Persistent atrial fibrillation On Eliquis for stroke prophylaxis (CHA2DS2-VASc of at least 3 for age, coronary disease, hypertension). Recommend starting with cardioversion.  I discussed the cardioversion procedure in detail the patient including the risks and he wishes to proceed.  I do think a durable rhythm control strategy is indicated given his history of structural heart disease and coronary artery disease.  We discussed the results from the [facility] AFNET4 trial during today's clinic appointment.  I discussed more long-term options for rhythm control during today's clinic appointment including antiarrhythmic drugs and catheter ablation.  Discussed treatment options today for AF including antiarrhythmic drug therapy and ablation. Discussed risks, recovery and likelihood of success with each treatment strategy. Risk, benefits, and alternatives to EP study and ablation for afib were discussed. These risks include but are not limited to stroke, bleeding, vascular damage, tamponade, perforation, damage to the esophagus, lungs, phrenic nerve and other structures, pulmonary vein stenosis, worsening renal function, coronary vasospasm and death.  Discussed potential need for repeat ablation procedures and antiarrhythmic  drugs after an initial ablation. The patient understands these risk and wishes to proceed.  We will therefore proceed with catheter ablation at the next available time.  Carto, ICE, anesthesia are requested for the procedure.  Will also obtain CT PV protocol prior to the procedure to exclude LAA thrombus and further evaluate atrial anatomy.  We also discussed his stroke risk reduction strategies during today's  clinic appointment.  We discussed indefinite anticoagulation and left atrial appendage occlusion.  We discussed the Watchman procedure in detail including the risks, recovery and likelihood of success.  He would like to proceed.  Will target concomitant ablation/watchman but if this cannot be accomplished, favor a staged approach.  -----------------------  I have seen Jacob Small in the office today who is being considered for a Watchman left atrial appendage closure device. I believe they will benefit from this procedure given their history of atrial fibrillation, CHA2DS2-VASc score of 3 and unadjusted ischemic stroke rate of 3.2% per year. The patient's chart has been reviewed and I feel that they would be a candidate for short term oral anticoagulation after Watchman implant.   It is my belief that after undergoing a LAA closure procedure, Jacob Small will not need long term anticoagulation which eliminates anticoagulation side effects and major bleeding risk.   Procedural risks for the Watchman implant have been reviewed with the patient including a 0.5% risk of stroke, <1% risk of perforation and <1% risk of device embolization. Other risks include bleeding, vascular damage, tamponade, worsening renal function, and death. The patient understands these risk and wishes to proceed.     The published clinical data on the safety and effectiveness of WATCHMAN include but are not limited to the following: - Holmes DR, Everlene Farrier, Sick P et al. for the PROTECT AF Investigators. Percutaneous closure of the left atrial appendage versus warfarin therapy for prevention of stroke in patients with atrial fibrillation: a randomised non-inferiority trial. Lancet 2009; 374: 534-42. Everlene Farrier, Doshi SK, Isa Rankin D et al. on behalf of the PROTECT AF Investigators. Percutaneous Left Atrial Appendage Closure for Stroke Prophylaxis in Patients With Atrial Fibrillation 2.3-Year Follow-up of the PROTECT AF  (Watchman Left Atrial Appendage System for Embolic Protection in Patients With Atrial Fibrillation) Trial. Circulation 2013; 127:720-729. - Alli O, Doshi S,  Kar S, Reddy VY, Sievert H et al. Quality of Life Assessment in the Randomized PROTECT AF (Percutaneous Closure of the Left Atrial Appendage Versus Warfarin Therapy for Prevention of Stroke in Patients With Atrial Fibrillation) Trial of Patients at Risk for Stroke With Nonvalvular Atrial Fibrillation. J Am Coll Cardiol 2013; 61:1790-8. Aline August DR, Mia Creek, Price M, Whisenant B, Sievert H, Doshi S, Huber K, Reddy V. Prospective randomized evaluation of the Watchman left atrial appendage Device in patients with atrial fibrillation versus long-term warfarin therapy; the PREVAIL trial. Journal of the [employer] of Cardiology, Vol. 4, [name] 1, 2014, 1-11. - Kar S, Doshi SK, Sadhu A, Horton R, Osorio J et al. Primary outcome evaluation of a next-generation left atrial appendage closure device: results from the PINNACLE FLX trial. Circulation 2021;143(18)1754-1762.    After today's visit with the patient which was dedicated solely for shared decision making visit regarding LAA closure device, the patient decided to proceed with the LAA appendage closure procedure scheduled to be done in the near future at [facility]. Prior to the procedure, I would like to obtain a gated CT scan of the chest with contrast timed  for PV/LA visualization.    HAS-BLED score 3 Hypertension Yes  Abnormal renal and liver function (Dialysis, transplant, Cr >2.26 mg/dL /Cirrhosis or Bilirubin >2x Normal or AST/ALT/AP >3x Normal) No  Stroke No  Bleeding No  Labile INR (Unstable/high INR) No  Elderly (>65) Yes  Drugs or alcohol (>= 8 drinks/week, anti-plt or NSAID) No   CHA2DS2-VASc Score = 3  The patient's score is based upon: CHF History: 0 HTN History: 1 Diabetes History: 0 Stroke History: 0 Vascular Disease History: 1 Age Score: 1 Gender Score:  0   Plan for concomitant A-fib ablation/watchman.  Signed, Rossie Muskrat. Lalla Brothers, MD, [facility], [facility] 05/05/2023 9:08 AM    Electrophysiology [facility]

## 2023-05-04 NOTE — Progress Notes (Unsigned)
 Electrophysiology Office Note:    Date:  05/05/2023   ID:  Jacob Small, DOB 1955-06-21, MRN [id number]  [facility] Cardiologist:  Jacob Muss, MD  [facility] Electrophysiologist:  Jacob Prude, MD   Referring MD: Jacob Dory, PA-C   Chief Complaint: Atrial fibrillation  History of Present Illness:    Jacob Small is a 68 year old man who I am seeing today for an evaluation of atrial fibrillation at the request of Jacob Favre, PA-C.  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  The patient last saw Jacob Small March 25, 2023.  At the last appoint with Jacob Small, the patient reported concern about indefinite use of anticoagulation.  He was interested in left atrial appendage occlusion.  Had a long discussion today about his atrial fibrillation management.  He stays fairly active.  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  He is minimally symptomatic with his atrial fibrillation.      Their past medical, social and family history was reviewed.   ROS:   Please see the history of present illness.    All other systems reviewed and are negative.  EKGs/Labs/Other Studies Reviewed:    The following studies were reviewed today:  December 17, 2022 EKG shows atrial fibrillation  November 05, 2022 EKG shows atrial fibrillation  September 23, 2021 EKG shows sinus rhythm  August 25, 2020 EKG shows sinus rhythm  October 07, 2021 echo shows normal EF, 60%.  Poorly visualized aortic valve.  Mild AI.       Physical Exam:    VS:  BP 110/74 (BP Location: Left Arm, Patient Position: Sitting, Cuff Size: Large)   Pulse 84   Ht 6\' 3"  (1.905 m)   Wt (!) 321 lb 12.8 oz (146 kg)   SpO2 96%   BMI 40.22 kg/m     Wt Readings from Last 3 Encounters:  05/05/23 (!) 321 lb 12.8 oz (146 kg)  03/25/23 (!) 322 lb (146.1 kg)  12/17/22 (!) 322 lb 12.8 oz (146.4 kg)     GEN: no  distress.  Obese CARD: RRR, No MRG RESP: No IWOB. CTAB.        ASSESSMENT AND PLAN:    1. Atrial fibrillation, unspecified type (HCC)   2. Left ventricular hypertrophy   3. Coronary artery disease involving coronary bypass graft of native heart with angina pectoris [facility])     #Persistent atrial fibrillation On Eliquis for stroke prophylaxis (CHA2DS2-VASc of at least 3 for age, coronary disease, hypertension). Recommend starting with cardioversion.  I discussed the cardioversion procedure in detail the patient including the risks and he wishes to proceed.  I do think a durable rhythm control strategy is indicated given his history of structural heart disease and coronary artery disease.  We discussed the results from the [facility] AFNET4 trial during today'Small clinic appointment.  I discussed more long-term options for rhythm control during today'Small clinic appointment including antiarrhythmic drugs and catheter ablation.  Discussed treatment options today for AF including antiarrhythmic drug therapy and ablation. Discussed risks, recovery and likelihood of success with each treatment strategy. Risk, benefits, and alternatives to EP study and ablation for afib were discussed. These risks include but are not limited to stroke, bleeding, vascular damage, tamponade, perforation, damage to the esophagus, lungs, phrenic nerve and other structures, pulmonary vein stenosis, worsening renal function, coronary vasospasm and death.  Discussed potential need for repeat ablation procedures and antiarrhythmic  drugs after an initial ablation. The patient understands these risk and wishes to proceed.  We will therefore proceed with catheter ablation at the next available time.  Carto, ICE, anesthesia are requested for the procedure.  Will also obtain CT PV protocol prior to the procedure to exclude LAA thrombus and further evaluate atrial anatomy.  We also discussed his stroke risk reduction strategies during today'Small  clinic appointment.  We discussed indefinite anticoagulation and left atrial appendage occlusion.  We discussed the Watchman procedure in detail including the risks, recovery and likelihood of success.  He would like to proceed.  Will target concomitant ablation/watchman but if this cannot be accomplished, favor a staged approach.  -----------------------  I have seen Jacob Small in the office today who is being considered for a Watchman left atrial appendage closure device. I believe they will benefit from this procedure given their history of atrial fibrillation, CHA2DS2-VASc score of 3 and unadjusted ischemic stroke rate of 3.2% per year. The patient'Small chart has been reviewed and I feel that they would be a candidate for short term oral anticoagulation after Watchman implant.   It is my belief that after undergoing a LAA closure procedure, Jacob Small will not need long term anticoagulation which eliminates anticoagulation side effects and major bleeding risk.   Procedural risks for the Watchman implant have been reviewed with the patient including a 0.5% risk of stroke, <1% risk of perforation and <1% risk of device embolization. Other risks include bleeding, vascular damage, tamponade, worsening renal function, and death. The patient understands these risk and wishes to proceed.     The published clinical data on the safety and effectiveness of WATCHMAN include but are not limited to the following: - Jacob Small DR, Jacob Small, Jacob Small et al. for the PROTECT AF Investigators. Percutaneous closure of the left atrial appendage versus warfarin therapy for prevention of stroke in patients with atrial fibrillation: a randomised non-inferiority trial. Lancet 2009; 374: 534-42. Jacob Small, Jacob Small, Jacob Small et al. on behalf of the PROTECT AF Investigators. Percutaneous Left Atrial Appendage Closure for Stroke Prophylaxis in Patients With Atrial Fibrillation 2.3-Year Follow-up of the PROTECT AF  (Watchman Left Atrial Appendage System for Embolic Protection in Patients With Atrial Fibrillation) Trial. Circulation 2013; 127:720-729. - Jacob Small, Jacob Small,  Kar Small, Reddy VY, Sievert H et al. Quality of Life Assessment in the Randomized PROTECT AF (Percutaneous Closure of the Left Atrial Appendage Versus Warfarin Therapy for Prevention of Stroke in Patients With Atrial Fibrillation) Trial of Patients at Risk for Stroke With Nonvalvular Atrial Fibrillation. J Am Coll Cardiol 2013; 61:1790-8. Aline August DR, Mia Creek, Price M, Whisenant B, Sievert H, Jacob Small, Huber K, Reddy V. Prospective randomized evaluation of the Watchman left atrial appendage Device in patients with atrial fibrillation versus long-term warfarin therapy; the PREVAIL trial. Journal of the [employer] of Cardiology, Vol. 4, [name] 1, 2014, 1-11. - Kar Small, Jacob Small, Sadhu A, Horton R, Osorio J et al. Primary outcome evaluation of a next-generation left atrial appendage closure device: results from the PINNACLE FLX trial. Circulation 2021;143(18)1754-1762.    After today'Small visit with the patient which was dedicated solely for shared decision making visit regarding LAA closure device, the patient decided to proceed with the LAA appendage closure procedure scheduled to be done in the near future at [facility]. Prior to the procedure, I would like to obtain a gated CT scan of the chest with contrast timed  for PV/LA visualization.    HAS-BLED score 3 Hypertension Yes  Abnormal renal and liver function (Dialysis, transplant, Cr >2.26 mg/dL /Cirrhosis or Bilirubin >2x Normal or AST/ALT/AP >3x Normal) No  Stroke No  Bleeding No  Labile INR (Unstable/high INR) No  Elderly (>65) Yes  Drugs or alcohol (>= 8 drinks/week, anti-plt or NSAID) No   CHA2DS2-VASc Score = 3  The patient'Small score is based upon: CHF History: 0 HTN History: 1 Diabetes History: 0 Stroke History: 0 Vascular Disease History: 1 Age Score: 1 Gender Score:  0   Plan for concomitant A-fib ablation/watchman.  Signed, Rossie Muskrat. Lalla Brothers, MD, [facility], [facility] 05/05/2023 9:08 AM    Electrophysiology [facility]

## 2023-05-05 ENCOUNTER — Ambulatory Visit: Payer: Medicare Other | Attending: Cardiology | Admitting: Cardiology

## 2023-05-05 ENCOUNTER — Other Ambulatory Visit: Payer: Self-pay

## 2023-05-05 ENCOUNTER — Encounter: Payer: Self-pay | Admitting: Cardiology

## 2023-05-05 VITALS — BP 110/74 | HR 84 | Ht 75.0 in | Wt 321.8 lb

## 2023-05-05 DIAGNOSIS — I25709 Atherosclerosis of coronary artery bypass graft(s), unspecified, with unspecified angina pectoris: Secondary | ICD-10-CM

## 2023-05-05 DIAGNOSIS — I517 Cardiomegaly: Secondary | ICD-10-CM | POA: Diagnosis not present

## 2023-05-05 DIAGNOSIS — I4891 Unspecified atrial fibrillation: Secondary | ICD-10-CM

## 2023-05-05 LAB — CBC

## 2023-05-05 NOTE — Patient Instructions (Signed)
 Medication Instructions:  Your physician recommends that you continue on your current medications as directed. Please refer to the Current Medication list given to you today.  *If you need a refill on your cardiac medications before your next appointment, please call your pharmacy*   Lab Work: TODAY: BMET and CBC If you have labs (blood work) drawn today and your tests are completely normal, you will receive your results only by: MyChart Message (if you have MyChart) OR A paper copy in the mail If you have any lab test that is abnormal or we need to change your treatment, we will call you to review the results.   Testing/Procedures: Cardioversion Your physician has recommended that you have a Cardioversion (DCCV). Electrical Cardioversion uses a jolt of electricity to your heart either through paddles or wired patches attached to your chest. This is a controlled, usually prescheduled, procedure. Defibrillation is done under light anesthesia in the hospital, and you usually go home the day of the procedure. This is done to get your heart back into a normal rhythm. You are not awake for the procedure. Please see the instruction sheet given to you today.  Cardiac CT Your physician has requested that you have cardiac CT. Cardiac computed tomography (CT) is a painless test that uses an x-ray machine to take clear, detailed pictures of your heart. For further information please visit [email or web address] You will be called to schedule your CT scan  Ablation  Your physician has recommended that you have an ablation. Catheter ablation is a medical procedure used to treat some cardiac arrhythmias (irregular heartbeats). During catheter ablation, a long, thin, flexible tube is put into a blood vessel in your groin (upper thigh), or neck. This tube is called an ablation catheter. It is then guided to your heart through the blood vessel. Radio frequency waves destroy small areas of heart tissue where  abnormal heartbeats may cause an arrhythmia to start.  Watchman  Your physician has requested that you have Left atrial appendage (LAA) closure device implantation is a procedure to put a small device in the LAA of the heart. The LAA is a small sac in the wall of the heart's left upper chamber. Blood clots can form in this area. The device, Watchman closes the LAA to help prevent a blood clot and stroke.   Follow-Up: At [facility], you and your health needs are our priority.  As part of our continuing mission to provide you with exceptional heart care, we have created designated Provider Care Teams.  These Care Teams include your primary Cardiologist (physician) and Advanced Practice Providers (APPs -  Physician Assistants and Nurse Practitioners) who all work together to provide you with the care you need, when you need it.  You will be contacted by Nurse Navigator, Karsten Fells to schedule your pre-procedure visit and procedure date. If you have any questions she can be reached at [phone].

## 2023-05-06 LAB — CBC
Hematocrit: 44 % (ref 37.5–51.0)
Hemoglobin: 14.8 g/dL (ref 13.0–17.7)
MCH: 29.4 pg (ref 26.6–33.0)
MCHC: 33.6 g/dL (ref 31.5–35.7)
MCV: 87 fL (ref 79–97)
Platelets: 186 10*3/uL (ref 150–450)
RBC: 5.04 x10E6/uL (ref 4.14–5.80)
RDW: 13.7 % (ref 11.6–15.4)
WBC: 8 10*3/uL (ref 3.4–10.8)

## 2023-05-06 LAB — BASIC METABOLIC PANEL
BUN/Creatinine Ratio: 14 (ref 10–24)
BUN: 15 mg/dL (ref 8–27)
CO2: 27 mmol/L (ref 20–29)
Calcium: 9.4 mg/dL (ref 8.6–10.2)
Chloride: 99 mmol/L (ref 96–106)
Creatinine, Ser: 1.05 mg/dL (ref 0.76–1.27)
Glucose: 84 mg/dL (ref 70–99)
Potassium: 4.3 mmol/L (ref 3.5–5.2)
Sodium: 139 mmol/L (ref 134–144)
eGFR: 77 mL/min/{1.73_m2} (ref >59)

## 2023-05-06 NOTE — Progress Notes (Signed)
 Called patient with pre-procedure instructions for [facility] March 24th  Patient informed of:   Time to arrive for procedure 0830 Remain NPO past midnight.  Must have a ride home and a responsible adult to remain with them for 24 hours post procedure.  Confirmed blood thinner. Eliquis Confirmed no breaks in taking blood thinner for 3+ weeks prior to procedure. Confirmed patient stopped all GLP-1s and GLP-2s for at least one week before procedure.

## 2023-05-09 ENCOUNTER — Ambulatory Visit (HOSPITAL_COMMUNITY): Admitting: Anesthesiology

## 2023-05-09 ENCOUNTER — Other Ambulatory Visit: Payer: Self-pay

## 2023-05-09 ENCOUNTER — Encounter (HOSPITAL_COMMUNITY): Payer: Self-pay | Admitting: Cardiovascular Disease

## 2023-05-09 ENCOUNTER — Encounter (HOSPITAL_COMMUNITY)
Admission: RE | Disposition: A | Discharge status: Home / Self Care | Payer: Self-pay | Source: Home / Self Care | Attending: Cardiovascular Disease

## 2023-05-09 ENCOUNTER — Ambulatory Visit (HOSPITAL_COMMUNITY)
Admission: RE | Admit: 2023-05-09 | Discharge: 2023-05-09 | Disposition: A | Discharge status: Home / Self Care | Attending: Cardiovascular Disease | Admitting: Cardiovascular Disease

## 2023-05-09 DIAGNOSIS — I4819 Other persistent atrial fibrillation: Secondary | ICD-10-CM | POA: Diagnosis not present

## 2023-05-09 DIAGNOSIS — I119 Hypertensive heart disease without heart failure: Secondary | ICD-10-CM | POA: Insufficient documentation

## 2023-05-09 DIAGNOSIS — I2510 Atherosclerotic heart disease of native coronary artery without angina pectoris: Secondary | ICD-10-CM | POA: Diagnosis not present

## 2023-05-09 DIAGNOSIS — I10 Essential (primary) hypertension: Secondary | ICD-10-CM

## 2023-05-09 DIAGNOSIS — Z79899 Other long term (current) drug therapy: Secondary | ICD-10-CM | POA: Insufficient documentation

## 2023-05-09 DIAGNOSIS — I4891 Unspecified atrial fibrillation: Secondary | ICD-10-CM

## 2023-05-09 DIAGNOSIS — Z01818 Encounter for other preprocedural examination: Secondary | ICD-10-CM

## 2023-05-09 DIAGNOSIS — Z7901 Long term (current) use of anticoagulants: Secondary | ICD-10-CM | POA: Diagnosis not present

## 2023-05-09 DIAGNOSIS — I25709 Atherosclerosis of coronary artery bypass graft(s), unspecified, with unspecified angina pectoris: Secondary | ICD-10-CM | POA: Insufficient documentation

## 2023-05-09 DIAGNOSIS — Z87891 Personal history of nicotine dependence: Secondary | ICD-10-CM

## 2023-05-09 SURGERY — CARDIOVERSION (CATH LAB)
Anesthesia: General

## 2023-05-09 MED ORDER — PROPOFOL 10 MG/ML IV BOLUS
INTRAVENOUS | Status: DC | PRN
  Administered 2023-05-09: 100 mg via INTRAVENOUS

## 2023-05-09 MED ORDER — LIDOCAINE 2% (20 MG/ML) 5 ML SYRINGE
INTRAMUSCULAR | Status: DC | PRN
  Administered 2023-05-09: 60 mg via INTRAVENOUS

## 2023-05-09 MED ORDER — SODIUM CHLORIDE 0.9 % IV SOLN
INTRAVENOUS | Status: DC
Start: 2023-05-09 — End: 2023-05-09

## 2023-05-09 SURGICAL SUPPLY — 1 items: PAD DEFIB RADIO PHYSIO CONN (PAD) ×2 IMPLANT

## 2023-05-09 NOTE — Op Note (Signed)
 Procedure: Electrical Cardioversion Indications:  Atrial Fibrillation  Procedure Details:  Consent: Risks of procedure as well as the alternatives and risks of each were explained to the (patient/caregiver).  Consent for procedure obtained.  Time Out: Verified patient identification, verified procedure, site/side was marked, verified correct patient position, special equipment/implants available, medications/allergies/relevent history reviewed, required imaging and test results available.  Performed  Patient placed on cardiac monitor, pulse oximetry, supplemental oxygen as necessary.  Sedation given:  propofol 100 mg IV, Dr. Stephannie Peters Pacer pads placed anterior and posterior chest.  Cardioverted 1 time(s).  Cardioversion with synchronized biphasic 200J shock.  Evaluation: Findings: Post procedure EKG shows: NSR Complications: None Patient did tolerate procedure well.  Time Spent Directly with the Patient:  30 minutes   Jacob Small 05/09/2023, 8:58 AM

## 2023-05-09 NOTE — Transfer of Care (Signed)
 Immediate Anesthesia Transfer of Care Note  Patient: Jacob Small  Procedure(s) Performed: CARDIOVERSION  Patient Location: PACU  Anesthesia Type:MAC  Level of Consciousness: sedated  Airway & Oxygen Therapy: Patient Spontanous Breathing  Post-op Assessment: Report given to RN  Post vital signs: Reviewed and stable  Last Vitals:  Vitals Value Taken Time  BP 145/90   Temp    Pulse 71 05/09/23 0902  Resp 21 05/09/23 0902  SpO2 97 % 05/09/23 0902  Vitals shown include unfiled device data.  Last Pain:  Vitals:   05/09/23 0833  TempSrc:   PainSc: 0-No pain         Complications: No notable events documented.

## 2023-05-09 NOTE — Anesthesia Preprocedure Evaluation (Addendum)
 Anesthesia Evaluation  Patient identified by MRN, date of birth, ID band Patient awake    Reviewed: Allergy & Precautions, NPO status , Patient's Chart, lab work & pertinent test results  History of Anesthesia Complications Negative for: history of anesthetic complications  Airway Mallampati: IV  TM Distance: >3 FB Neck ROM: Full    Dental  (+) Partial Upper, Missing   Pulmonary former smoker   Pulmonary exam normal        Cardiovascular hypertension, Pt. on medications + CAD, + Past MI and + CABG (2012)  Normal cardiovascular exam+ dysrhythmias (on Eliquis) Atrial Fibrillation   TTE 2023: EF 60-65%, moderate LVH, severe LAE, bicuspid valve with valve  sparing surgery during root replacement AV sclerosis with mild AR, mild dilatation of aortic root measuring 41mm    Neuro/Psych negative neurological ROS  negative psych ROS   GI/Hepatic negative GI ROS, Neg liver ROS,,,  Endo/Other    Class 3 obesity  Renal/GU negative Renal ROS  negative genitourinary   Musculoskeletal negative musculoskeletal ROS (+)    Abdominal   Peds  Hematology negative hematology ROS (+)   Anesthesia Other Findings Day of surgery medications reviewed with patient.  Reproductive/Obstetrics negative OB ROS                             Anesthesia Physical Anesthesia Plan  ASA: 3  Anesthesia Plan: General   Post-op Pain Management: Minimal or no pain anticipated   Induction: Intravenous  PONV Risk Score and Plan: Treatment may vary due to age or medical condition and Propofol infusion  Airway Management Planned: Mask  Additional Equipment: None  Intra-op Plan:   Post-operative Plan:   Informed Consent: I have reviewed the patients History and Physical, chart, labs and discussed the procedure including the risks, benefits and alternatives for the proposed anesthesia with the patient or authorized  representative who has indicated his/her understanding and acceptance.       Plan Discussed with: CRNA  Anesthesia Plan Comments:         Anesthesia Quick Evaluation

## 2023-05-09 NOTE — Anesthesia Postprocedure Evaluation (Signed)
 Anesthesia Post Note  Patient: Jacob Small  Procedure(s) Performed: CARDIOVERSION     Patient location during evaluation: PACU Anesthesia Type: General Level of consciousness: awake and alert Pain management: pain level controlled Vital Signs Assessment: post-procedure vital signs reviewed and stable Respiratory status: spontaneous breathing, nonlabored ventilation and respiratory function stable Cardiovascular status: blood pressure returned to baseline Postop Assessment: no apparent nausea or vomiting Anesthetic complications: no   No notable events documented.  Last Vitals:  Vitals:   05/09/23 0942 05/09/23 0943  BP: 130/84 127/80  Pulse: 60 62  Resp: 18 13  Temp: 37 C   SpO2: 96% 97%    Last Pain:  Vitals:   05/09/23 0905  TempSrc:   PainSc: 0-No pain                 Shanda Howells

## 2023-05-09 NOTE — Interval H&P Note (Signed)
 History and Physical Interval Note:  05/09/2023 8:14 AM  Jacob Small  has presented today for surgery, with the diagnosis of AFIB.  The various methods of treatment have been discussed with the patient and family. After consideration of risks, benefits and other options for treatment, the patient has consented to  Procedure(s): CARDIOVERSION (N/A) as a surgical intervention.  The patient's history has been reviewed, patient examined, no change in status, stable for surgery.  I have reviewed the patient's chart and labs.  Questions were answered to the patient's satisfaction.     [name]

## 2023-06-10 ENCOUNTER — Telehealth: Payer: Self-pay | Admitting: Cardiology

## 2023-06-10 NOTE — Progress Notes (Signed)
 Cardiology Office Note:   Date:  06/17/2023  ID:  Jacob Small, DOB 23-May-1955, MRN [id number] PCP:  Elida Grounds, DO  [facility] Providers Cardiologist:  Alyssa Backbone, MD Referring MD: Elida Grounds, DO  Chief Complaint/Reason for Referral: Follow-up coronary artery disease, atrial fibrillation ASSESSMENT:    1. Coronary artery disease involving coronary bypass graft of native heart with angina pectoris (HCC)   2. S/P thoracic aortic aneurysm repair   3. Bicuspid aortic valve   4. Persistent atrial fibrillation (HCC)   5. Secondary hypercoagulable state (HCC)   6. Essential hypertension   7. Hyperlipidemia LDL goal <70   8. CKD (chronic kidney disease) stage 2, GFR 60-89 ml/min   9. Diastolic dysfunction   10. BMI 40.0-44.9, adult (HCC)     PLAN:   In order of problems listed above: Coronary artery disease: Continue Eliquis  5 mg twice daily and rosuvastatin  5 mg daily. Status post aneurysm repair: Mild dilatation on TTE 41 mm. Bicuspid aortic valve: Obtain echocardiogram to evaluate. Persistent atrial fibrillation: Status post cardioversion March 2025 continue Eliquis  5 mg twice daily, Coreg  milligrams twice daily, mg twice daily; followed by EP.  Being considered for concomitant procedure. Secondary hypercoagulable state: Continue Eliquis  5 mg twice daily for now; being evaluated for potential left atrial appendage occlusion. Hypertension: Continue amlodipine  5 mg, Coreg  6.25 mg twice daily, and losartan /hydrochlorothiazide  100 x 25 mg daily.  BP well controlled today. Hyperlipidemia: Continue Crestor  5 mg daily; check lipid panel, LFTs, and LP(a) today. CKD stage II: Continue losartan /hydrochlorothiazide  100 x 25 mg daily for renal protection.  I discussed starting Jardiance with the patient.  He would like to hold off on this for now and think about it. Diastolic dysfunction: Continue losartan  /hydrochlorothiazide  100 x 25 mg daily; see discussion about Jardiance  above. Elevated BMI: Will check hemoglobin A1c today.  Otherwise diet and exercise modification.            Dispo:  Return in about 6 months (around 12/18/2023).      Medication Adjustments/Labs and Tests Ordered: Current medicines are reviewed at length with the patient today.  Concerns regarding medicines are outlined above.  The following changes have been made:     Labs/tests ordered: Orders Placed This Encounter  Procedures   Lipoprotein A (LPA)   Lipid panel   Hepatic function panel   HgB A1c   ECHOCARDIOGRAM COMPLETE    Medication Changes: No orders of the defined types were placed in this encounter.   Current medicines are reviewed at length with the patient today.  The patient does not have concerns regarding medicines.  I spent 33 minutes reviewing all clinical data during and prior to this visit including all relevant imaging studies, laboratories, clinical information from other health systems and prior notes from both Cardiology and other specialties, interviewing the patient, conducting a complete physical examination, and coordinating care in order to formulate a comprehensive and personalized evaluation and treatment plan.   History of Present Illness:      FOCUSED PROBLEM LIST:   Bicuspid aortic valve  AVA 2.1, MG 12, EF 60 to 65% TTE 2023 Ascending aortic aneurysm Status post valve sparing root replacement 2012 Coronary artery disease CABG LIMA to LAD, vein graft to PDA 2012 PAF CV 2 score of 3 On Eliquis  Cardioversion March 2025 Undergoing evaluation for left atrial appendage occlusion Diastolic dysfunction Moderate LVH, severe LAE, EF 60 to 65%  TTE 2023 Hyperlipidemia Hypertension 1 AVB CKD stage II BMI 40  April 2025:  Patient consents to use of AI scribe. The patient is here for routine follow-up.  The patient was last seen by general cardiology in February of this year.  He was doing well but had concerns about bleeding in the context  of his anticoagulation.  He saw EP being considered for left atrial appendage occlusion procedure.  Because he was symptomatic he was referred for cardioversion in March which was successful.  He did not feel the atrial fibrillation until it was pointed out during a medical visit. No lightheadedness, palpitations, or episodes of heart pounding. He is currently on blood thinners, which he dislikes due to prolonged bleeding from minor cuts, but has not experienced any major bleeding events such as epistaxis or melena.  He is currently on a cholesterol medication and denies experiencing any joint or muscle aches associated with it. No lightheadedness or blacking out spells and has not required emergency department visits recently.  He mentions that his urologist is considering an MRI for his prostate and gallbladder, but this has been postponed pending other procedures.  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 He used to carry no Band-Aids but now finds it necessary due to the bleeding issues.          Current Medications: Current Meds  Medication Sig   amLODipine  (NORVASC ) 5 MG tablet Take 1 tablet (5 mg total) by mouth daily.   apixaban  (ELIQUIS ) 5 MG TABS tablet Take 1 tablet (5 mg total) by mouth 2 (two) times daily.   carvedilol  (COREG ) 6.25 MG tablet Take 1 tablet (6.25 mg total) by mouth 2 (two) times daily with a meal.   losartan -hydrochlorothiazide  (HYZAAR) 100-25 MG tablet Take 1 tablet by mouth daily.   rosuvastatin  (CRESTOR ) 5 MG tablet TAKE 1 TABLET BY MOUTH THREE TIMES A WEEK   tamsulosin (FLOMAX) 0.4 MG CAPS capsule Take 0.4 mg by mouth daily.     Review of Systems:   Please see the history of present illness.    All other systems reviewed and are negative.     EKGs/Labs/Other Test Reviewed:   EKG: March 2024 sinus rhythm with first-degree AV block and PVCs  EKG  Interpretation Date/Time:    Ventricular Rate:    PR Interval:    QRS Duration:    QT Interval:    QTC Calculation:   R Axis:      Text Interpretation:           Risk Assessment/Calculations:    CHA2DS2-VASc Score = 3   This indicates a 3.2% annual risk of stroke. The patient's score is based upon: CHF History: 0 HTN History: 1 Diabetes History: 0 Stroke History: 0 Vascular Disease History: 1 Age Score: 1 Gender Score: 0         Physical Exam:   VS:  BP 132/78 (BP Location: Left Arm, Patient Position: Sitting, Cuff Size: Large)   Pulse 64   Ht 6\' 3"  (1.905 m)   Wt (!) 322 lb 3.2 oz (146.1 kg)   SpO2 98%   BMI 40.27 kg/m        Wt Readings from Last 3 Encounters:  06/17/23 (!) 322 lb 3.2 oz (146.1 kg)  05/05/23 (!) 321 lb 12.8 oz (146 kg)  03/25/23 (!) 322 lb (146.1 kg)     Can you please add a Emokpae hemoglobin A1c on today to GENERAL:  No apparent distress,  AOx3 HEENT:  No carotid bruits, +2 carotid impulses, no scleral icterus CAR: RRR no murmurs, gallops, rubs, or thrills RES:  Clear to auscultation bilaterally ABD:  Soft, nontender, nondistended, positive bowel sounds x 4 VASC:  +2 radial pulses, +2 carotid pulses NEURO:  CN 2-12 grossly intact; motor and sensory grossly intact PSYCH:  No active depression or anxiety EXT:  No edema, ecchymosis, or cyanosis  Signed, Kathia Covington K Damilola Flamm, MD  06/17/2023 8:25 AM    [facility] [address] [place], [place], [place]  [address] Phone: [phone]; Fax: [phone]   Note:  This document was prepared using Dragon voice recognition software and may include unintentional dictation errors.

## 2023-06-10 NOTE — Telephone Encounter (Signed)
 Pt states he is to be scheduled for an ablation but has not heard from our office

## 2023-06-17 ENCOUNTER — Ambulatory Visit: Payer: Medicare Other | Attending: Internal Medicine | Admitting: Internal Medicine

## 2023-06-17 ENCOUNTER — Encounter: Payer: Self-pay | Admitting: Internal Medicine

## 2023-06-17 VITALS — BP 132/78 | HR 64 | Ht 75.0 in | Wt 322.2 lb

## 2023-06-17 DIAGNOSIS — Z6841 Body Mass Index (BMI) 40.0 and over, adult: Secondary | ICD-10-CM

## 2023-06-17 DIAGNOSIS — Z9889 Other specified postprocedural states: Secondary | ICD-10-CM

## 2023-06-17 DIAGNOSIS — E785 Hyperlipidemia, unspecified: Secondary | ICD-10-CM

## 2023-06-17 DIAGNOSIS — Q2381 Bicuspid aortic valve: Secondary | ICD-10-CM

## 2023-06-17 DIAGNOSIS — I25709 Atherosclerosis of coronary artery bypass graft(s), unspecified, with unspecified angina pectoris: Secondary | ICD-10-CM

## 2023-06-17 DIAGNOSIS — I4819 Other persistent atrial fibrillation: Secondary | ICD-10-CM

## 2023-06-17 DIAGNOSIS — D6869 Other thrombophilia: Secondary | ICD-10-CM

## 2023-06-17 DIAGNOSIS — N182 Chronic kidney disease, stage 2 (mild): Secondary | ICD-10-CM

## 2023-06-17 DIAGNOSIS — I10 Essential (primary) hypertension: Secondary | ICD-10-CM

## 2023-06-17 DIAGNOSIS — I5189 Other ill-defined heart diseases: Secondary | ICD-10-CM

## 2023-06-17 DIAGNOSIS — Z8679 Personal history of other diseases of the circulatory system: Secondary | ICD-10-CM

## 2023-06-17 LAB — LIPID PANEL

## 2023-06-17 LAB — HEMOGLOBIN A1C
Est. average glucose Bld gHb Est-mCnc: 120 mg/dL
Hgb A1c MFr Bld: 5.8 % — ABNORMAL HIGH (ref 4.8–5.6)

## 2023-06-17 NOTE — Patient Instructions (Addendum)
 Medication Instructions:  No changes *If you need a refill on your cardiac medications before your next appointment, please call your pharmacy*  Lab Work: Today: hgA1c, lipids, liver function and Lpa  If you have labs (blood work) drawn today and your tests are completely normal, you will receive your results only by: MyChart Message (if you have MyChart) OR A paper copy in the mail If you have any lab test that is abnormal or we need to change your treatment, we will call you to review the results.  Testing/Procedures: Your physician has requested that you have an echocardiogram. Echocardiography is a painless test that uses sound waves to create images of your heart. It provides your doctor with information about the size and shape of your heart and how well your heart's chambers and valves are working. This procedure takes approximately one hour. There are no restrictions for this procedure. Please do NOT wear cologne, perfume, aftershave, or lotions (deodorant is allowed). Please arrive 15 minutes prior to your appointment time.  Please note: We ask at that you not bring children with you during ultrasound (echo/ vascular) testing. Due to room size and safety concerns, children are not allowed in the ultrasound rooms during exams. Our front office staff cannot provide observation of children in our lobby area while testing is being conducted. An adult accompanying a patient to their appointment will only be allowed in the ultrasound room at the discretion of the ultrasound technician under special circumstances. We apologize for any inconvenience.   Follow-Up: At [facility], you and your health needs are our priority.  As part of our continuing mission to provide you with exceptional heart care, our providers are all part of one team.  This team includes your primary Cardiologist (physician) and Advanced Practice Providers or APPs (Physician Assistants and Nurse Practitioners) who  all work together to provide you with the care you need, when you need it.  Your next appointment:   6 month(s)  Provider:   Advanced Practice Provider (NP or PA-C)  We recommend signing up for the patient portal called "MyChart".  Sign up information is provided on this After Visit Summary.  MyChart is used to connect with patients for Virtual Visits (Telemedicine).  Patients are able to view lab/test results, encounter notes, upcoming appointments, etc.  Non-urgent messages can be sent to your provider as well.   To learn more about what you can do with MyChart, go to [email or web address].

## 2023-06-17 NOTE — Telephone Encounter (Signed)
 Left message to call back to discuss how soon the patient wants procedures. If he wants to the procedures done sooner than later, staging ablation and LAAO separately will need to be pursued.

## 2023-06-18 LAB — HEPATIC FUNCTION PANEL
ALT: 16 IU/L (ref 0–44)
AST: 23 IU/L (ref 0–40)
Albumin: 4.2 g/dL (ref 3.9–4.9)
Alkaline Phosphatase: 84 IU/L (ref 44–121)
Bilirubin Total: 1.8 mg/dL — ABNORMAL HIGH (ref 0.0–1.2)
Bilirubin, Direct: 0.48 mg/dL — ABNORMAL HIGH (ref 0.00–0.40)
Total Protein: 6.4 g/dL (ref 6.0–8.5)

## 2023-06-18 LAB — LIPID PANEL
Cholesterol, Total: 114 mg/dL (ref 100–199)
HDL: 29 mg/dL — ABNORMAL LOW (ref >39)
LDL CALC COMMENT:: 3.9 ratio (ref 0.0–5.0)
LDL Chol Calc (NIH): 63 mg/dL (ref 0–99)
Triglycerides: 118 mg/dL (ref 0–149)
VLDL Cholesterol Cal: 22 mg/dL (ref 5–40)

## 2023-06-18 LAB — LIPOPROTEIN A (LPA): Lipoprotein (a): 27.2 nmol/L (ref <75.0)

## 2023-06-21 NOTE — Telephone Encounter (Signed)
 The patient wishes to proceed with concomitant PVI/LAAO on 9/4. He is willing to wait and says he is feeling fine s/p DCCV. He will keep appointment for echocardiogram 6/4, however, he would like to establish care with Dr. Alvis Ba in the future. Will call the patient once logistics are complete.

## 2023-06-23 ENCOUNTER — Encounter: Payer: Self-pay | Admitting: *Deleted

## 2023-07-07 ENCOUNTER — Other Ambulatory Visit: Payer: Self-pay

## 2023-07-07 ENCOUNTER — Telehealth: Payer: Self-pay | Admitting: *Deleted

## 2023-07-07 DIAGNOSIS — I4819 Other persistent atrial fibrillation: Secondary | ICD-10-CM

## 2023-07-07 NOTE — Telephone Encounter (Signed)
   Pre-operative Risk Assessment    Patient Name: Jacob Small  DOB: 1955-04-04 MRN: [id number]   Date of last office visit: 06/17/2023 Date of next office visit: N/A   Request for Surgical Clearance    Procedure:  PROSTATE BIOPSY  Date of Surgery:  Clearance TBD                                Surgeon:   Surgeon's Group or Practice Name:  [facility] Phone number:  [phone] Fax number:  [phone]   Type of Clearance Requested:   - Medical  - Pharmacy:  Hold Apixaban  (Eliquis ) X'S 3 DAYS    Type of Anesthesia:  Not Indicated   Additional requests/questions:    Berenda Breaker   07/07/2023, 7:01 AM

## 2023-07-07 NOTE — Telephone Encounter (Addendum)
 The patient agreed to new concomitant PVI/LAAO on 09/16/2023. Dr. Stann Earnest has agreed to assist with imaging. His echo and CT will be done 7/14. Booked him 7/8 with Creighton Doffing for pre-op visit. He has issues with his prostate currently and is having MRI and biopsy soon. He is hopeful prostate issues will be resolved by that time. The patient was grateful for call and agreed with plan.

## 2023-07-07 NOTE — Telephone Encounter (Signed)
 Patty from [facility] would like to know if the pt is to hold Eliquis  for 2 days or 3 days. Please advise

## 2023-07-07 NOTE — Telephone Encounter (Signed)
   Patient Name: Jacob Small  DOB: Apr 09, 1955 MRN: [id number]  Primary Cardiologist: Avery Bodo, MD  Chart reviewed as part of pre-operative protocol coverage. Given past medical history and time since last visit, based on ACC/AHA guidelines, Vidur D Maertens is at acceptable risk for the planned procedure without further cardiovascular testing.   The patient was advised that if he develops new symptoms prior to surgery to contact our office to arrange for a follow-up visit, and he verbalized understanding.  Per cardiology patient can hold Eliquis  2 days prior to procedure and should restart postprocedure when surgically safe and hemostasis is achieved.  I will route this recommendation to the requesting party via Epic fax function and remove from pre-op pool.  Please call with questions.  Francene Ing, Retha Cast, NP 07/07/2023, 8:27 AM

## 2023-07-07 NOTE — Telephone Encounter (Signed)
 Good Morning Dr. Lorie Rook  We have received a surgical clearance request for Mr. Jeanty for a prostate biopsy. They were seen recently in clinic on 06/17/2023. Can you please comment on surgical clearance and guidance on holding Eliquis  for 3 days prior to his upcoming procedure. Please forward you guidance and recommendations to P CV DIV PREOP   Thanks, Charles Connor, NP

## 2023-07-07 NOTE — Telephone Encounter (Signed)
 Returned call back to [facility], spoke with [place].  She has been made aware of the recommendations of pt holding Eliquis  X's 2 days prior to procedure.

## 2023-07-19 ENCOUNTER — Telehealth: Payer: Self-pay

## 2023-07-19 NOTE — Telephone Encounter (Signed)
 Spoke with the patient at length.  He had a lesion removed from his nose and was told by his dermatologist they are sending him to the skin center for further testing. He is also having prostate issues and has an MRI scheduled 08/25/2023. After much discussion, it was decided to postpone concomitant ablation/Watchman a few weeks.  Rescheduled Brandi's visit to 8/5. Hopefully by that visit, all prostate and skin cancer testing will be complete.  If the patient is deemed a candidate for PVI/LAAO at that visit (ie: testing is negative and will not need to undergo CA tx), he will proceed with echo/CT on 8/7 and concomitant PVI/LAAO on 8/28. He was grateful for call and agreed with plan.

## 2023-07-20 ENCOUNTER — Other Ambulatory Visit (HOSPITAL_COMMUNITY)

## 2023-08-23 ENCOUNTER — Ambulatory Visit: Admitting: Pulmonary Disease

## 2023-08-25 ENCOUNTER — Ambulatory Visit
Admission: RE | Admit: 2023-08-25 | Discharge: 2023-08-25 | Disposition: A | Discharge status: Home / Self Care | Source: Ambulatory Visit | Attending: Urology | Admitting: Urology

## 2023-08-25 DIAGNOSIS — R972 Elevated prostate specific antigen [PSA]: Secondary | ICD-10-CM

## 2023-08-25 MED ORDER — GADOPICLENOL 0.5 MMOL/ML IV SOLN
10.0000 mL | Freq: Once | INTRAVENOUS | Status: AC | PRN
  Administered 2023-08-25: 10 mL via INTRAVENOUS

## 2023-08-29 ENCOUNTER — Other Ambulatory Visit (HOSPITAL_COMMUNITY)

## 2023-09-01 NOTE — Telephone Encounter (Signed)
 Left message to call back

## 2023-09-05 NOTE — Telephone Encounter (Signed)
 Pt returning call to nurse

## 2023-09-05 NOTE — Telephone Encounter (Signed)
 Left message to call back

## 2023-09-06 NOTE — Telephone Encounter (Signed)
 The patient reports he had an MRI and was told he does not have prostate cancer. He has planned 6 month follow-up with recheck PSA.  His dermatologist, however, found a spot on his nose and he is having what sounds like MOHS procedure on 7/30.  At this time, he will keep pre-concomitant visit on 8/5 and echo/CT on 8/7.  Will follow up with the patient on 7/31 to see if MOHS went OK. If so, the patient will proceed as planned.

## 2023-09-08 NOTE — Progress Notes (Signed)
  Electrophysiology Office Note:   Date:  09/20/2023  ID:  Jacob Small, DOB May 07, 1955, MRN [id number]  Primary Cardiologist: Candyce Reek, MD Primary Heart Failure: None Electrophysiologist: OLE ONEIDA HOLTS, MD      History of Present Illness:   Jacob Small is a 68 y.o. male with h/o AF, HTN, HLD, CAD, aortic root replacement with CABG, obesity seen today for routine electrophysiology followup.   Since last being seen in our clinic the patient reports doing well. He is pending LAAO / ablation with Dr. HOLTS 10/13/23. He had a skin cancer removed on his nose and his MD put him on prophylactic abx due to pending procedure. Last dose of abx 09/20/23.   He denies chest pain, palpitations, dyspnea, PND, orthopnea, nausea, vomiting, dizziness, syncope, edema, weight gain, or early satiety.   Review of systems complete and found to be negative unless listed in HPI.   EP Information / Studies Reviewed:    EKG is ordered today. Personal review as below.  EKG Interpretation Date/Time:  Tuesday September 20 2023 07:54:22 EDT Ventricular Rate:  56 PR Interval:  176 QRS Duration:  94 QT Interval:  454 QTC Calculation: 438 R Axis:   11  Text Interpretation: Sinus bradycardia with Premature ventricular complexes Confirmed by [name] ([id number]) on 09/20/2023 8:01:38 AM   Arrhythmia / AAD AF  DCCV 05/09/23     Risk Assessment/Calculations:    CHA2DS2-VASc Score = 3   This indicates a 3.2% annual risk of stroke. The patient's score is based upon: CHF History: 0 HTN History: 1 Diabetes History: 0 Stroke History: 0 Vascular Disease History: 1 Age Score: 1 Gender Score: 0         Physical Exam:   VS:  BP (!) 140/80 (BP Location: Left Arm, Patient Position: Sitting)   Pulse (!) 56   Ht 6' 2 (1.88 m)   Wt (!) 326 lb 9.6 oz (148.1 kg)   SpO2 98%   BMI 41.93 kg/m    Wt Readings from Last 3 Encounters:  09/20/23 (!) 326 lb 9.6 oz (148.1 kg)  06/17/23 (!) 322 lb 3.2 oz  (146.1 kg)  05/05/23 (!) 321 lb 12.8 oz (146 kg)     GEN: Well nourished, well developed in no acute distress HEENT: No JVD; No carotid bruits, band aide on nose, no erythema / edema CARDIAC: Regular rate and rhythm, no murmurs, rubs, gallops RESPIRATORY:  Clear to auscultation without rales, wheezing or rhonchi  ABDOMEN: Soft, non-tender, non-distended EXTREMITIES:  No edema; No deformity   ASSESSMENT AND PLAN:    Persistent Atrial Fibrillation  CHA2DS2-VASc 3 -OAC for stroke prophylaxis  -continue coreg  6.25mg  BID  -pending concomitant PVI ablation, LAAO on 10/13/23 with Dr. HOLTS  -procedure details, process for day of procedure reviewed with patient   -pre-procedure labs > BMP, CBC  -written instructions given to patient  -EKG with NSR   -RN navigator is going to call patient with LAAO details once ECHO and CT are completed.   Secondary Hypercoagulable State  -continue Eliquis  5mg  BID, dose reviewed and appropriate by age / wt   Hypertension  -mildly elevated in clinic, monitor BP for now   CAD s/p CABG with Aortic Root Replacement  -no anginal symptoms    -follows with Dr. Wendel   Follow up with Dr. HOLTS as planned for [facility] / ablation    Signed, Jarvis Aniceto, NP-C, AGACNP-BC [facility] - Electrophysiology  09/20/2023, 5:54 PM

## 2023-09-15 NOTE — Telephone Encounter (Signed)
 Spoke with the patient. His MOHS procedure went great and they got it all.  He does have a small wound from the procedure which he will be dressing for 2 weeks.  He understands that as long as the wound is healed and there is no infection in the meantime, we will proceed with plan as scheduled.

## 2023-09-19 NOTE — Telephone Encounter (Signed)
 The patient called to ensure the location of his appointment tomorrow. Confirmed his appointment is at the [facility].  Confirmed echo and CT appointments later this week. Unfortunately his CT got moved to a different day for some reason by the CT department. Will update instructions. The patient was grateful for call and agreed with plan.

## 2023-09-20 ENCOUNTER — Encounter: Payer: Self-pay | Admitting: Pulmonary Disease

## 2023-09-20 ENCOUNTER — Ambulatory Visit: Attending: Internal Medicine | Admitting: Pulmonary Disease

## 2023-09-20 VITALS — BP 140/80 | HR 56 | Ht 74.0 in | Wt 326.6 lb

## 2023-09-20 DIAGNOSIS — D6869 Other thrombophilia: Secondary | ICD-10-CM | POA: Diagnosis not present

## 2023-09-20 DIAGNOSIS — I4819 Other persistent atrial fibrillation: Secondary | ICD-10-CM | POA: Diagnosis not present

## 2023-09-20 LAB — BASIC METABOLIC PANEL WITH GFR
BUN/Creatinine Ratio: 19 (ref 10–24)
BUN: 16 mg/dL (ref 8–27)
CO2: 24 mmol/L (ref 20–29)
Calcium: 9.6 mg/dL (ref 8.6–10.2)
Chloride: 99 mmol/L (ref 96–106)
Creatinine, Ser: 0.83 mg/dL (ref 0.76–1.27)
Glucose: 90 mg/dL (ref 70–99)
Potassium: 3.7 mmol/L (ref 3.5–5.2)
Sodium: 139 mmol/L (ref 134–144)
eGFR: 95 mL/min/1.73 (ref >59)

## 2023-09-20 LAB — CBC
Hematocrit: 45.3 % (ref 37.5–51.0)
Hemoglobin: 14.7 g/dL (ref 13.0–17.7)
MCH: 29.2 pg (ref 26.6–33.0)
MCHC: 32.5 g/dL (ref 31.5–35.7)
MCV: 90 fL (ref 79–97)
Platelets: 178 x10E3/uL (ref 150–450)
RBC: 5.04 x10E6/uL (ref 4.14–5.80)
RDW: 13.5 % (ref 11.6–15.4)
WBC: 8.8 x10E3/uL (ref 3.4–10.8)

## 2023-09-20 NOTE — Patient Instructions (Signed)
 Medication Instructions:  Your physician recommends that you continue on your current medications as directed. Please refer to the Current Medication list given to you today.  *If you need a refill on your cardiac medications before your next appointment, please call your pharmacy*  Lab Work: BMET, CBC-TODAY If you have labs (blood work) drawn today and your tests are completely normal, you will receive your results only by: MyChart Message (if you have MyChart) OR A paper copy in the mail If you have any lab test that is abnormal or we need to change your treatment, we will call you to review the results.  Testing/Procedures: See letters  Follow-Up: At [facility], you and your health needs are our priority.  As part of our continuing mission to provide you with exceptional heart care, our providers are all part of one team.  This team includes your primary Cardiologist (physician) and Advanced Practice Providers or APPs (Physician Assistants and Nurse Practitioners) who all work together to provide you with the care you need, when you need it.  Your next appointment:   Follow up will be arranged for you and print out on your discharge summary after your procedure.  We recommend signing up for the patient portal called MyChart.  Sign up information is provided on this After Visit Summary.  MyChart is used to connect with patients for Virtual Visits (Telemedicine).  Patients are able to view lab/test results, encounter notes, upcoming appointments, etc.  Non-urgent messages can be sent to your provider as well.   To learn more about what you can do with MyChart, go to [email or web address].

## 2023-09-21 ENCOUNTER — Ambulatory Visit: Payer: Self-pay | Admitting: *Deleted

## 2023-09-22 ENCOUNTER — Ambulatory Visit (HOSPITAL_COMMUNITY)
Admission: RE | Admit: 2023-09-22 | Discharge: 2023-09-22 | Disposition: A | Discharge status: Home / Self Care | Source: Ambulatory Visit | Attending: Cardiology | Admitting: Cardiology

## 2023-09-22 ENCOUNTER — Ambulatory Visit (HOSPITAL_COMMUNITY)

## 2023-09-22 DIAGNOSIS — Q2381 Bicuspid aortic valve: Secondary | ICD-10-CM | POA: Diagnosis present

## 2023-09-22 LAB — ECHOCARDIOGRAM COMPLETE
AV Mean grad: 10.3 mmHg
AV Peak grad: 19.5 mmHg
Ao pk vel: 2.21 m/s
Area-P 1/2: 2.45 cm2
S' Lateral: 4.5 cm

## 2023-09-23 ENCOUNTER — Ambulatory Visit: Payer: Self-pay | Admitting: Internal Medicine

## 2023-09-23 ENCOUNTER — Ambulatory Visit (HOSPITAL_COMMUNITY)
Admission: RE | Admit: 2023-09-23 | Discharge: 2023-09-23 | Disposition: A | Discharge status: Home / Self Care | Source: Ambulatory Visit | Attending: Cardiology | Admitting: Cardiology

## 2023-09-23 DIAGNOSIS — I517 Cardiomegaly: Secondary | ICD-10-CM | POA: Insufficient documentation

## 2023-09-23 DIAGNOSIS — I4891 Unspecified atrial fibrillation: Secondary | ICD-10-CM | POA: Insufficient documentation

## 2023-09-23 DIAGNOSIS — I25709 Atherosclerosis of coronary artery bypass graft(s), unspecified, with unspecified angina pectoris: Secondary | ICD-10-CM | POA: Diagnosis not present

## 2023-09-23 MED ORDER — IOHEXOL 350 MG/ML SOLN
80.0000 mL | Freq: Once | INTRAVENOUS | Status: AC | PRN
  Administered 2023-09-23: 80 mL via INTRAVENOUS

## 2023-09-26 ENCOUNTER — Ambulatory Visit: Payer: Self-pay | Admitting: Cardiology

## 2023-09-28 ENCOUNTER — Telehealth: Payer: Self-pay

## 2023-09-28 NOTE — Telephone Encounter (Signed)
 Ripley Sheral: This is a small appendage but enough room to close the appendage. Max 23/ AVG 20.6/ Depth 12.7 Likely use a 24mm device and TruSteer sheath Inf/Mid-Ant TSP RAO 6 CAU 21

## 2023-10-06 NOTE — Telephone Encounter (Signed)
 Spoke with the patient at length.  He understood that Dr. Cindie recommends ablation only.  At this time, he is unsure whether he wishes to proceed with ablation only.  He requested to think about it over the weekend. Will call him early Monday AM to confirm if he is proceeding. He was grateful for call and agreed with plan.  Will remove LAAO from the case and update precert in the meantime.

## 2023-10-10 NOTE — Telephone Encounter (Signed)
 The patient thought about it over the weekend and does not wish to proceed with LAAO only. Cancelled the patient's ablation 10/13/2023. Scheduled him for follow-up with Dr. Cindie 11/11/2023. He was grateful for call and agreed with plan.

## 2023-10-13 ENCOUNTER — Encounter (HOSPITAL_COMMUNITY): Admission: RE | Payer: Self-pay | Source: Home / Self Care

## 2023-10-13 ENCOUNTER — Inpatient Hospital Stay (HOSPITAL_COMMUNITY): Admission: RE | Admit: 2023-10-13 | Source: Home / Self Care | Admitting: Cardiology

## 2023-10-13 SURGERY — ATRIAL FIBRILLATION ABLATION
Anesthesia: General

## 2023-10-29 ENCOUNTER — Other Ambulatory Visit: Payer: Self-pay | Admitting: Interventional Cardiology

## 2023-11-08 ENCOUNTER — Other Ambulatory Visit: Payer: Self-pay | Admitting: Interventional Cardiology

## 2023-11-10 NOTE — Progress Notes (Unsigned)
  Electrophysiology Office Follow up Visit Note:    Date:  11/11/2023   ID:  Jacob Small, DOB [birth year], MRN [id number]  PCP:  Auston Opal, DO  [facility] Cardiologist:  Candyce Reek, MD  [facility] Electrophysiologist:  OLE ONEIDA HOLTS, MD    Interval History:     Jacob Small is a 68 y.o. male who presents for a follow up visit.   I last saw the patient in March of this year to discuss concomitant A-fib ablation and left atrial appendage occlusion.  CT scan prior to the procedures demonstrated a very shallow left atrial appendage.  The procedures were ultimately canceled.  He presents today for follow-up.  His CHA2DS2-VASc is at least 3 for age, coronary artery disease and hypertension.  He is doing well today.  No complaints.  He has not felt any arrhythmias.      Past medical, surgical, social and family history were reviewed.  ROS:   Please see the history of present illness.    All other systems reviewed and are negative.  EKGs/Labs/Other Studies Reviewed:    The following studies were reviewed today:  September 20, 2023 EKG shows sinus rhythm, PVC  May 05, 2023 EKG shows atrial fibrillation   EKG Interpretation Date/Time:  Friday November 11 2023 08:43:27 EDT Ventricular Rate:  59 PR Interval:    QRS Duration:  100 QT Interval:  418 QTC Calculation: 413 R Axis:   43  Text Interpretation: Sinus rhythm Artifact Confirmed by HOLTS OLE [phone]) on 11/11/2023 8:49:31 AM    Physical Exam:    VS:  BP 126/70 (BP Location: Left Arm, Patient Position: Sitting, Cuff Size: Large)   Pulse (!) 59   Ht 6' 2 (1.88 m)   Wt (!) 322 lb (146.1 kg)   SpO2 98%   BMI 41.34 kg/m     Wt Readings from Last 3 Encounters:  11/11/23 (!) 322 lb (146.1 kg)  09/20/23 (!) 326 lb 9.6 oz (148.1 kg)  06/17/23 (!) 322 lb 3.2 oz (146.1 kg)     GEN: no distress CARD: RRR, No MRG RESP: No IWOB. CTAB.      ASSESSMENT:    1. Persistent atrial  fibrillation (HCC)   2. Essential hypertension    PLAN:    In order of problems listed above:  #Persistent atrial fibrillation Previously discussed catheter ablation for his atrial fibrillation.  I do not think he is an acceptable candidate for watchman implant given shallow left atrial appendage anatomy.  I again discussed treatment options including conservative/watchful waiting, antiarrhythmic drugs and catheter ablation.  I discussed options in detail and he would like to continue with a conservative management strategy which I think is reasonable.  We can always revisit catheter ablation should he develop more symptomatic atrial fibrillation.  For now, continue anticoagulation given elevated CHA2DS2-VASc  #Hypertension At goal today.  Recommend checking blood pressures 1-2 times per week at home and recording the values.  Recommend bringing these recordings to the primary care physician.       Signed, OLE HOLTS, MD, [facility] 11/11/2023 8:55 AM    Electrophysiology [facility]

## 2023-11-11 ENCOUNTER — Ambulatory Visit: Attending: Cardiology | Admitting: Cardiology

## 2023-11-11 ENCOUNTER — Encounter: Payer: Self-pay | Admitting: Cardiology

## 2023-11-11 VITALS — BP 126/70 | HR 59 | Ht 74.0 in | Wt 322.0 lb

## 2023-11-11 DIAGNOSIS — I10 Essential (primary) hypertension: Secondary | ICD-10-CM | POA: Diagnosis not present

## 2023-11-11 DIAGNOSIS — I4819 Other persistent atrial fibrillation: Secondary | ICD-10-CM | POA: Diagnosis not present

## 2023-11-11 NOTE — Patient Instructions (Signed)
 Medication Instructions:  Your physician recommends that you continue on your current medications as directed. Please refer to the Current Medication list given to you today.  *If you need a refill on your cardiac medications before your next appointment, please call your pharmacy*  Follow-Up: At [facility], you and your health needs are our priority.  As part of our continuing mission to provide you with exceptional heart care, our providers are all part of one team.  This team includes your primary Cardiologist (physician) and Advanced Practice Providers or APPs (Physician Assistants and Nurse Practitioners) who all work together to provide you with the care you need, when you need it.  Your next appointment:   1 year  Provider:   You may see OLE ONEIDA HOLTS, MD or one of the following Advanced Practice Providers on your designated Care Team:   Charlies Arthur, [place] Ozell Jodie Passey, PA-C Suzann Riddle, NP Daphne Barrack, NP Artist Pouch, PA-C

## 2023-11-13 ENCOUNTER — Emergency Department (HOSPITAL_COMMUNITY)

## 2023-11-13 ENCOUNTER — Inpatient Hospital Stay (HOSPITAL_COMMUNITY)
Admission: EM | Admit: 2023-11-13 | Discharge: 2023-11-17 | DRG: 412 | Disposition: A | Discharge status: Home / Self Care | Attending: Internal Medicine | Admitting: Internal Medicine

## 2023-11-13 ENCOUNTER — Other Ambulatory Visit: Payer: Self-pay

## 2023-11-13 ENCOUNTER — Inpatient Hospital Stay (HOSPITAL_COMMUNITY)

## 2023-11-13 ENCOUNTER — Encounter (HOSPITAL_COMMUNITY): Payer: Self-pay

## 2023-11-13 DIAGNOSIS — K8066 Calculus of gallbladder and bile duct with acute and chronic cholecystitis without obstruction: Secondary | ICD-10-CM | POA: Diagnosis present

## 2023-11-13 DIAGNOSIS — E66813 Obesity, class 3: Secondary | ICD-10-CM | POA: Diagnosis present

## 2023-11-13 DIAGNOSIS — R1013 Epigastric pain: Secondary | ICD-10-CM | POA: Diagnosis present

## 2023-11-13 DIAGNOSIS — I252 Old myocardial infarction: Secondary | ICD-10-CM | POA: Diagnosis not present

## 2023-11-13 DIAGNOSIS — Z79899 Other long term (current) drug therapy: Secondary | ICD-10-CM

## 2023-11-13 DIAGNOSIS — Z7901 Long term (current) use of anticoagulants: Secondary | ICD-10-CM | POA: Diagnosis not present

## 2023-11-13 DIAGNOSIS — I4819 Other persistent atrial fibrillation: Secondary | ICD-10-CM | POA: Diagnosis not present

## 2023-11-13 DIAGNOSIS — K8050 Calculus of bile duct without cholangitis or cholecystitis without obstruction: Secondary | ICD-10-CM | POA: Diagnosis not present

## 2023-11-13 DIAGNOSIS — K660 Peritoneal adhesions (postprocedural) (postinfection): Secondary | ICD-10-CM | POA: Diagnosis present

## 2023-11-13 DIAGNOSIS — K429 Umbilical hernia without obstruction or gangrene: Secondary | ICD-10-CM | POA: Diagnosis present

## 2023-11-13 DIAGNOSIS — Z6841 Body Mass Index (BMI) 40.0 and over, adult: Secondary | ICD-10-CM

## 2023-11-13 DIAGNOSIS — I712 Thoracic aortic aneurysm, without rupture, unspecified: Secondary | ICD-10-CM | POA: Diagnosis present

## 2023-11-13 DIAGNOSIS — K828 Other specified diseases of gallbladder: Secondary | ICD-10-CM | POA: Diagnosis present

## 2023-11-13 DIAGNOSIS — K82A1 Gangrene of gallbladder in cholecystitis: Secondary | ICD-10-CM | POA: Diagnosis present

## 2023-11-13 DIAGNOSIS — Z87891 Personal history of nicotine dependence: Secondary | ICD-10-CM

## 2023-11-13 DIAGNOSIS — Z808 Family history of malignant neoplasm of other organs or systems: Secondary | ICD-10-CM | POA: Diagnosis not present

## 2023-11-13 DIAGNOSIS — Z888 Allergy status to other drugs, medicaments and biological substances status: Secondary | ICD-10-CM

## 2023-11-13 DIAGNOSIS — I2510 Atherosclerotic heart disease of native coronary artery without angina pectoris: Secondary | ICD-10-CM | POA: Diagnosis present

## 2023-11-13 DIAGNOSIS — I4821 Permanent atrial fibrillation: Secondary | ICD-10-CM | POA: Diagnosis present

## 2023-11-13 DIAGNOSIS — I25709 Atherosclerosis of coronary artery bypass graft(s), unspecified, with unspecified angina pectoris: Secondary | ICD-10-CM | POA: Diagnosis present

## 2023-11-13 DIAGNOSIS — I25119 Atherosclerotic heart disease of native coronary artery with unspecified angina pectoris: Secondary | ICD-10-CM | POA: Diagnosis not present

## 2023-11-13 DIAGNOSIS — K8070 Calculus of gallbladder and bile duct without cholecystitis without obstruction: Secondary | ICD-10-CM | POA: Diagnosis not present

## 2023-11-13 DIAGNOSIS — Z951 Presence of aortocoronary bypass graft: Secondary | ICD-10-CM

## 2023-11-13 DIAGNOSIS — N400 Enlarged prostate without lower urinary tract symptoms: Secondary | ICD-10-CM | POA: Diagnosis present

## 2023-11-13 DIAGNOSIS — E785 Hyperlipidemia, unspecified: Secondary | ICD-10-CM | POA: Diagnosis present

## 2023-11-13 DIAGNOSIS — E782 Mixed hyperlipidemia: Secondary | ICD-10-CM | POA: Diagnosis not present

## 2023-11-13 DIAGNOSIS — K8042 Calculus of bile duct with acute cholecystitis without obstruction: Secondary | ICD-10-CM | POA: Diagnosis not present

## 2023-11-13 DIAGNOSIS — I714 Abdominal aortic aneurysm, without rupture, unspecified: Secondary | ICD-10-CM | POA: Diagnosis present

## 2023-11-13 DIAGNOSIS — Z8249 Family history of ischemic heart disease and other diseases of the circulatory system: Secondary | ICD-10-CM

## 2023-11-13 DIAGNOSIS — I7143 Infrarenal abdominal aortic aneurysm, without rupture: Secondary | ICD-10-CM | POA: Diagnosis present

## 2023-11-13 DIAGNOSIS — I10 Essential (primary) hypertension: Secondary | ICD-10-CM | POA: Diagnosis present

## 2023-11-13 LAB — COMPREHENSIVE METABOLIC PANEL WITH GFR
ALT: 14 U/L (ref 0–44)
AST: 23 U/L (ref 15–41)
Albumin: 4.3 g/dL (ref 3.5–5.0)
Alkaline Phosphatase: 77 U/L (ref 38–126)
Anion gap: 11 (ref 5–15)
BUN: 16 mg/dL (ref 8–23)
CO2: 24 mmol/L (ref 22–32)
Calcium: 9.5 mg/dL (ref 8.9–10.3)
Chloride: 98 mmol/L (ref 98–111)
Creatinine, Ser: 0.81 mg/dL (ref 0.61–1.24)
GFR, Estimated: >60 mL/min (ref >60)
Glucose, Bld: 147 mg/dL — ABNORMAL HIGH (ref 70–99)
Potassium: 3.8 mmol/L (ref 3.5–5.1)
Sodium: 133 mmol/L — ABNORMAL LOW (ref 135–145)
Total Bilirubin: 1.5 mg/dL — ABNORMAL HIGH (ref 0.0–1.2)
Total Protein: 7.1 g/dL (ref 6.5–8.1)

## 2023-11-13 LAB — CBC
HCT: 42.7 % (ref 39.0–52.0)
Hemoglobin: 13.9 g/dL (ref 13.0–17.0)
MCH: 28 pg (ref 26.0–34.0)
MCHC: 32.6 g/dL (ref 30.0–36.0)
MCV: 85.9 fL (ref 80.0–100.0)
Platelets: 170 K/uL (ref 150–400)
RBC: 4.97 MIL/uL (ref 4.22–5.81)
RDW: 12.9 % (ref 11.5–15.5)
WBC: 14.3 K/uL — ABNORMAL HIGH (ref 4.0–10.5)
nRBC: 0 % (ref 0.0–0.2)

## 2023-11-13 LAB — URINALYSIS, ROUTINE W REFLEX MICROSCOPIC
Bilirubin Urine: NEGATIVE
Glucose, UA: NEGATIVE mg/dL
Hgb urine dipstick: NEGATIVE
Ketones, ur: NEGATIVE mg/dL
Leukocytes,Ua: NEGATIVE
Nitrite: NEGATIVE
Protein, ur: NEGATIVE mg/dL
Specific Gravity, Urine: 1.03 (ref 1.005–1.030)
pH: 5 (ref 5.0–8.0)

## 2023-11-13 LAB — LIPASE, BLOOD: Lipase: 25 U/L (ref 11–51)

## 2023-11-13 MED ORDER — PIPERACILLIN-TAZOBACTAM 3.375 G IVPB 30 MIN
3.3750 g | Freq: Once | INTRAVENOUS | Status: AC
  Administered 2023-11-13: 3.375 g via INTRAVENOUS
  Filled 2023-11-13: qty 50

## 2023-11-13 MED ORDER — GADOBUTROL 1 MMOL/ML IV SOLN
10.0000 mL | Freq: Once | INTRAVENOUS | Status: AC | PRN
Start: 2023-11-13 — End: 2023-11-13
  Administered 2023-11-13: 10 mL via INTRAVENOUS

## 2023-11-13 MED ORDER — ONDANSETRON HCL 4 MG/2ML IJ SOLN: 4.0000 mg | Freq: Four times a day (QID) | INTRAMUSCULAR | Status: DC | PRN

## 2023-11-13 MED ORDER — HYDROCHLOROTHIAZIDE 25 MG PO TABS
25.0000 mg | ORAL_TABLET | Freq: Every day | ORAL | Status: DC
  Administered 2023-11-13 – 2023-11-15 (×3): 25 mg via ORAL
  Filled 2023-11-13 (×4): qty 1

## 2023-11-13 MED ORDER — AMLODIPINE BESYLATE 5 MG PO TABS
5.0000 mg | ORAL_TABLET | Freq: Every day | ORAL | Status: DC
  Administered 2023-11-13 – 2023-11-15 (×3): 5 mg via ORAL
  Filled 2023-11-13 (×4): qty 1

## 2023-11-13 MED ORDER — OXYCODONE HCL 5 MG PO TABS
5.0000 mg | ORAL_TABLET | ORAL | Status: DC | PRN
  Administered 2023-11-13: 5 mg via ORAL
  Filled 2023-11-13 (×2): qty 1

## 2023-11-13 MED ORDER — ACETAMINOPHEN 650 MG RE SUPP: 650.0000 mg | Freq: Four times a day (QID) | RECTAL | Status: DC | PRN

## 2023-11-13 MED ORDER — LOSARTAN POTASSIUM 50 MG PO TABS
100.0000 mg | ORAL_TABLET | Freq: Every day | ORAL | Status: DC
  Administered 2023-11-13 – 2023-11-15 (×3): 100 mg via ORAL
  Filled 2023-11-13 (×4): qty 2

## 2023-11-13 MED ORDER — ONDANSETRON HCL 4 MG PO TABS: 4.0000 mg | ORAL_TABLET | Freq: Four times a day (QID) | ORAL | Status: DC | PRN

## 2023-11-13 MED ORDER — ENOXAPARIN SODIUM 40 MG/0.4ML IJ SOSY
40.0000 mg | PREFILLED_SYRINGE | INTRAMUSCULAR | Status: DC
  Administered 2023-11-13 – 2023-11-14 (×2): 40 mg via SUBCUTANEOUS
  Filled 2023-11-13 (×2): qty 0.4

## 2023-11-13 MED ORDER — CARVEDILOL 6.25 MG PO TABS
6.2500 mg | ORAL_TABLET | Freq: Two times a day (BID) | ORAL | Status: DC
  Administered 2023-11-13 – 2023-11-17 (×9): 6.25 mg via ORAL
  Filled 2023-11-13: qty 2
  Filled 2023-11-13: qty 1
  Filled 2023-11-13: qty 2
  Filled 2023-11-13: qty 1
  Filled 2023-11-13 (×2): qty 2
  Filled 2023-11-13: qty 1
  Filled 2023-11-13: qty 2
  Filled 2023-11-13: qty 1

## 2023-11-13 MED ORDER — LORAZEPAM 1 MG PO TABS
1.0000 mg | ORAL_TABLET | Freq: Once | ORAL | Status: AC
  Administered 2023-11-13: 1 mg via ORAL
  Filled 2023-11-13: qty 1

## 2023-11-13 MED ORDER — TAMSULOSIN HCL 0.4 MG PO CAPS
0.4000 mg | ORAL_CAPSULE | Freq: Every day | ORAL | Status: DC
  Administered 2023-11-13 – 2023-11-16 (×4): 0.4 mg via ORAL
  Filled 2023-11-13 (×5): qty 1

## 2023-11-13 MED ORDER — ALBUTEROL SULFATE (2.5 MG/3ML) 0.083% IN NEBU: 2.5000 mg | INHALATION_SOLUTION | RESPIRATORY_TRACT | Status: DC | PRN

## 2023-11-13 MED ORDER — LOSARTAN POTASSIUM-HCTZ 100-25 MG PO TABS
1.0000 | ORAL_TABLET | Freq: Every day | ORAL | Status: DC
Start: 2023-11-13 — End: 2023-11-13

## 2023-11-13 MED ORDER — MORPHINE SULFATE (PF) 4 MG/ML IV SOLN
4.0000 mg | Freq: Once | INTRAVENOUS | Status: AC
  Administered 2023-11-13: 4 mg via INTRAVENOUS
  Filled 2023-11-13: qty 1

## 2023-11-13 MED ORDER — TRAZODONE HCL 50 MG PO TABS: 25.0000 mg | ORAL_TABLET | Freq: Every evening | ORAL | Status: DC | PRN

## 2023-11-13 MED ORDER — ONDANSETRON HCL 4 MG/2ML IJ SOLN
4.0000 mg | Freq: Once | INTRAMUSCULAR | Status: AC
  Administered 2023-11-13: 4 mg via INTRAVENOUS
  Filled 2023-11-13: qty 2

## 2023-11-13 MED ORDER — ACETAMINOPHEN 325 MG PO TABS
650.0000 mg | ORAL_TABLET | Freq: Four times a day (QID) | ORAL | Status: DC | PRN
  Administered 2023-11-14 – 2023-11-16 (×3): 650 mg via ORAL
  Filled 2023-11-13 (×4): qty 2

## 2023-11-13 MED ORDER — MORPHINE SULFATE (PF) 2 MG/ML IV SOLN: 2.0000 mg | INTRAVENOUS | Status: DC | PRN

## 2023-11-13 MED ORDER — ROSUVASTATIN CALCIUM 5 MG PO TABS
5.0000 mg | ORAL_TABLET | ORAL | Status: DC
  Administered 2023-11-14 – 2023-11-16 (×2): 5 mg via ORAL
  Filled 2023-11-13 (×3): qty 1

## 2023-11-13 MED ORDER — IOHEXOL 350 MG/ML SOLN
100.0000 mL | Freq: Once | INTRAVENOUS | Status: AC | PRN
  Administered 2023-11-13: 100 mL via INTRAVENOUS

## 2023-11-13 MED ORDER — SODIUM CHLORIDE 0.9 % IV SOLN
INTRAVENOUS | Status: AC
Start: 2023-11-13 — End: 2023-11-14

## 2023-11-13 NOTE — ED Triage Notes (Signed)
 Pt arrived from home via POV c/o RUQ abd pain 10/10 that began at 2000 11/13/2023 with multiple emesis episodes. Pt also noted to have new protrusion the size of a racket ball that pt states popped out after emesis episodes.

## 2023-11-13 NOTE — ED Notes (Signed)
 Patient transported to CT

## 2023-11-13 NOTE — Consult Note (Addendum)
 Referring Provider: Dr. Zella Primary Care Physician:  Auston Opal, DO Primary Gastroenterologist:  Sampson  Reason for Consultation:  Abdominal pain; Gallstones  HPI: CY BRESEE is a 68 y.o. male with acute onset of right-sided severe abdominal pain and nausea and vomiting yesterday.  Denies diarrhea.  Reports having mushy stool prior to admit.  CT angiogram shows cholelithiasis and a 5 mm cystic duct stone and additional calcifications and a nondilated common bile duct.  Liver enzymes are normal.  Wife in room.  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)    Past Surgical History:  Procedure Laterality Date   AORTIC ROOT REPLACEMENT  09/22/2010   Median sternotomy for valve-sparing aortic root replacemnt Charolotte I reimplanation technique) with resection of engrafting of the ascending thoracic aorta .Partial circulatoru arrest, coronaru aartery bypass grafting X2 (left intrnal mammary artery to distal left anterior descending coronary artery, saphenous vein graft to posterrior descending coronary artery, EVH rt thigh   CARDIOVERSION N/A 05/09/2023   Procedure: CARDIOVERSION;  Surgeon: Francyne Headland, MD;  Location: [facility] INVASIVE CV LAB;  Service: Cardiovascular;  Laterality: N/A;   CORONARY ARTERY BYPASS GRAFT  09/22/2010   CABGx2 with LIMA to LAD, SVG to PDA   CORONARY ARTERY BYPASS GRAFT     CYSTOSCOPY WITH INSERTION OF UROLIFT N/A 03/01/2019   Procedure: CYSTOSCOPY WITH INSERTION OF  UROLIFT;  Surgeon: Kassie Ozell SAUNDERS, MD;  Location: [facility] ORS;  Service: Urology;  Laterality: N/A;   EXTERNAL EAR SURGERY     HAND SURGERY     KNEE DEBRIDEMENT     02/2008 CHONDROPLASTY/DEB. OF MENISCUS   PILONIDAL CYST EXCISION     PROSTATE SURGERY  09/30/10   BX  2010 NEG    Prior to Admission medications   Medication Sig Start Date End Date Taking? Authorizing Provider  amLODipine  (NORVASC ) 5 MG tablet TAKE 1 TABLET BY MOUTH DAILY. 11/08/23  Yes Thukkani, Arun K, MD  apixaban  (ELIQUIS ) 5 MG TABS tablet Take 1 tablet (5 mg total) by mouth 2 (two) times daily. 11/05/22  Yes Dann Candyce RAMAN, MD  carvedilol  (COREG ) 6.25 MG tablet TAKE 1 TABLET BY MOUTH 2 TIMES DAILY WITH A MEAL. 11/08/23  Yes Thukkani, Arun K, MD  losartan -hydrochlorothiazide  (HYZAAR) 100-25 MG tablet TAKE 1 TABLET BY MOUTH EVERY DAY 10/31/23  Yes Thukkani, Arun K, MD  rosuvastatin  (CRESTOR ) 5 MG tablet TAKE 1 TABLET BY MOUTH THREE TIMES A WEEK Patient taking differently: Take 5 mg by mouth See admin instructions. TAKE 1 TABLET BY MOUTH THREE TIMES A WEEK 11/05/22  Yes Dann Candyce RAMAN, MD  tamsulosin (FLOMAX) 0.4 MG CAPS capsule Take 0.4 mg by mouth daily. 04/28/23  Yes [provider]    Scheduled Meds:  amLODipine   5 mg Oral Daily   carvedilol   6.25 mg Oral BID WC   enoxaparin (LOVENOX) injection  40 mg Subcutaneous Q24H   losartan   100 mg Oral Daily   And   hydrochlorothiazide   25 mg Oral Daily   LORazepam  1 mg Oral Once   [START ON 11/14/2023] rosuvastatin   5 mg Oral Q M,W,F   tamsulosin  0.4 mg Oral Daily   Continuous Infusions:  sodium chloride  100 mL/hr at 11/13/23 1036   PRN Meds:.acetaminophen  **OR** acetaminophen , albuterol, morphine injection, ondansetron  **OR** ondansetron  (ZOFRAN ) IV, oxyCODONE , traZODone  Allergies as of 11/13/2023 - Review Complete 11/13/2023  Allergen Reaction Noted   Lisinopril Cough 02/26/2019   Metoprolol Cough 02/26/2019    Family History  Problem Relation Age  of Onset   Heart disease Father    Cancer Brother        skin    Social History   Socioeconomic History   Marital status: Married    Spouse name: Not on file   Number of children: Not on file   Years of education: Not on file   Highest education level: Not on file  Occupational History   Not on file  Tobacco Use   Smoking status: Former    Current packs/day: 0.00    Types: Cigarettes    Quit date: 02/16/1995    Years since quitting: 28.7   Smokeless tobacco: Never  Vaping Use   Vaping status: Never Used  Substance and Sexual Activity   Alcohol use: Not Currently   Drug use: Never   Sexual activity: Not on file  Other Topics Concern   Not on file  Social History Narrative   ** Merged History Encounter **       Married ,lives in [place]   Social Drivers of Health   Financial Resource Strain: Not on file  Food Insecurity: No Food Insecurity (11/13/2023)   Hunger Vital Sign    Worried About Running Out of Food in the Last Year: Never true    Ran Out of Food in the Last Year: Never true  Transportation Needs: No Transportation Needs (11/13/2023)   PRAPARE - Administrator, Civil Service (Medical): No    Lack of Transportation (Non-Medical): No  Physical Activity: Not on file  Stress: Not on file  Social Connections: Moderately Isolated (11/13/2023)   Social Connection and Isolation Panel    Frequency of Communication with Friends and Family: More than three times a week    Frequency of Social Gatherings with Friends and Family: More than three times a week    Attends Religious Services: Never    Database administrator or Organizations: No    Attends Banker Meetings: Never    Marital Status: Married  Catering manager Violence: Not At Risk (11/13/2023)   Humiliation, Afraid, Rape, and Kick questionnaire    Fear of Current or Ex-Partner: No    Emotionally Abused: No    Physically Abused: No    Sexually Abused: No    Review of Systems:  All negative except as stated above in HPI.  Physical Exam: Vital signs: Vitals:   11/13/23 0849 11/13/23 0944  BP:  (!) 141/67  Pulse:  60  Resp:  16  Temp: 97.9 F (36.6 C) 98 F (36.7 C)  SpO2:  98%   Last BM Date : 11/13/23 General:   Alert,  Obese, pleasant and cooperative in NAD Head: normocephalic, atraumatic Eyes: anicteric sclera ENT: oropharynx clear Neck: supple, nontender Lungs:  Clear throughout to auscultation.   No wheezes, crackles, or rhonchi. No acute distress. Heart:  Regular rate and rhythm; no murmurs, clicks,  rubs,  or gallops. Abdomen: Diffuse tenderness with guarding (worse right upper quadrant), soft, nondistended, positive bowel sounds, obese, umbilical hernia noted Rectal: Deferred Ext: no edema  GI:  Lab Results: Recent Labs    11/13/23 0431  WBC 14.3*  HGB 13.9  HCT 42.7  PLT 170   BMET Recent Labs    11/13/23 0431  NA 133*  K 3.8  CL 98  CO2 24  GLUCOSE 147*  BUN 16  CREATININE 0.81  CALCIUM  9.5   LFT Recent Labs    11/13/23 0431  PROT 7.1  ALBUMIN 4.3  AST 23  ALT 14  ALKPHOS 77  BILITOT 1.5*   PT/INR No results for input(s): LABPROT, INR in the last 72 hours.    Impression/Plan: Biliary colic with CT angio showing 5 mm cystic duct stone and additional calcifications in the nondilated common bile duct- doubt choledocholithiasis and agree with MRCP.  If CBD stones are present, then they are nonobstructing with normal liver enzymes.  Await MRCP.  NPO until MRCP complete.  Consult surgery.  Supportive care.  Dr. Rollin will follow-up for GI tomorrow.    LOS: 0 days   Jerrell JAYSON Sol  11/13/2023, 11:01 AM  Questions please call [phone]

## 2023-11-13 NOTE — Consult Note (Signed)
 Reason for Consult: Choledocholithiasis/cholelithiasis and cholecystitis Referring Physician: Zella MD  Jacob Small is an 68 y.o. male.  HPI: Pleasant 68 year old male with a history of epigastric abdominal pain off and on for years.  He had a severe attack of epigastric pain and chest pain that brought him to the emergency room where a CT angiogram was performed.  This showed gallstones and a questionable common bile duct stone.  MRCP was performed which showed a common bile duct stone distal common bile duct.  GI medicine consulted.  He feels better now.  He has had the symptoms off and on for many years.  Location is epigastrium with radiation to his back.  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)    Past Surgical History:  Procedure Laterality Date   AORTIC ROOT REPLACEMENT  09/22/2010   Median sternotomy for valve-sparing aortic root replacemnt Charolotte I reimplanation technique) with resection of engrafting of the ascending thoracic aorta .Partial circulatoru arrest, coronaru aartery bypass grafting X2 (left intrnal mammary artery to distal left anterior descending coronary artery, saphenous vein graft to posterrior descending coronary artery, EVH rt thigh   CARDIOVERSION N/A 05/09/2023   Procedure: CARDIOVERSION;  Surgeon: Francyne Headland, MD;  Location: [facility];  Service: Cardiovascular;  Laterality: N/A;   CORONARY ARTERY BYPASS GRAFT  09/22/2010   CABGx2 with LIMA to LAD,  SVG to PDA   CORONARY ARTERY BYPASS GRAFT     CYSTOSCOPY WITH INSERTION OF UROLIFT N/A 03/01/2019   Procedure: CYSTOSCOPY WITH INSERTION OF UROLIFT;  Surgeon: Kassie Ozell SAUNDERS, MD;  Location: [facility] ORS;  Service: Urology;  Laterality: N/A;   EXTERNAL EAR SURGERY     HAND SURGERY     KNEE DEBRIDEMENT     02/2008 CHONDROPLASTY/DEB. OF MENISCUS   PILONIDAL CYST EXCISION     PROSTATE SURGERY  09/30/10   BX  2010 NEG    Family History  Problem Relation Age of Onset   Heart disease Father    Cancer Brother        skin    Social History:  reports that he quit smoking about 28 years ago. His smoking use included cigarettes. He has never used smokeless tobacco. He reports that he does not currently use alcohol. He reports that he does not use drugs.  Allergies:  Allergies  Allergen Reactions   Lisinopril Cough   Metoprolol Cough     Fatigue     Medications: I have reviewed the patient's current medications.  Results for orders placed or performed during the hospital encounter of 11/13/23 (from the past 48 hours)  Lipase, blood     Status: None   Collection Time: 11/13/23  4:31 AM  Result Value Ref Range   Lipase 25 11 - 51 U/L    Comment: Performed at [facility], [address], [place], [place] [address]  Comprehensive metabolic panel  Status: Abnormal   Collection Time: 11/13/23  4:31 AM  Result Value Ref Range   Sodium 133 (L) 135 - 145 mmol/L   Potassium 3.8 3.5 - 5.1 mmol/L   Chloride 98 98 - 111 mmol/L   CO2 24 22 - 32 mmol/L   Glucose, Bld 147 (H) 70 - 99 mg/dL    Comment: Glucose reference range applies only to samples taken after fasting for at least 8 hours.   BUN 16 8 - 23 mg/dL   Creatinine, Ser 9.18 0.61 - 1.24 mg/dL   Calcium  9.5 8.9 - 10.3 mg/dL   Total Protein 7.1 6.5 - 8.1 g/dL   Albumin 4.3 3.5 - 5.0 g/dL   AST 23 15 - 41 U/L   ALT 14 0 - 44 U/L   Alkaline Phosphatase 77 38 - 126 U/L   Total Bilirubin 1.5 (H) 0.0 - 1.2 mg/dL   GFR,  Estimated >39 >39 mL/min    Comment: (NOTE) Calculated using the CKD-EPI Creatinine Equation (2021)    Anion gap 11 5 - 15    Comment: Performed at [facility], [address] [place], [place] [address]  CBC     Status: Abnormal   Collection Time: 11/13/23  4:31 AM  Result Value Ref Range   WBC 14.3 (H) 4.0 - 10.5 K/uL   RBC 4.97 4.22 - 5.81 MIL/uL   Hemoglobin 13.9 13.0 - 17.0 g/dL   HCT 57.2 60.9 - 47.9 %   MCV 85.9 80.0 - 100.0 fL   MCH 28.0 26.0 - 34.0 pg   MCHC 32.6 30.0 - 36.0 g/dL   RDW 87.0 88.4 - 84.4 %   Platelets 170 150 - 400 K/uL   nRBC 0.0 0.0 - 0.2 %    Comment: Performed at [facility], [address] [place], [place] [address]  Urinalysis, Routine w reflex microscopic -Urine, Clean Catch     Status: Abnormal   Collection Time: 11/13/23  4:31 AM  Result Value Ref Range   Color, Urine YELLOW YELLOW   APPearance HAZY (A) CLEAR   Specific Gravity, Urine 1.030 1.005 - 1.030   pH 5.0 5.0 - 8.0   Glucose, UA NEGATIVE NEGATIVE mg/dL   Hgb urine dipstick NEGATIVE NEGATIVE   Bilirubin Urine NEGATIVE NEGATIVE   Ketones, ur NEGATIVE NEGATIVE mg/dL   Protein, ur NEGATIVE NEGATIVE mg/dL   Nitrite NEGATIVE NEGATIVE   Leukocytes,Ua NEGATIVE NEGATIVE    Comment: Performed at [facility], [address], [place], [place] [address]    MR ABDOMEN MRCP W WO CONTAST Result Date: 11/13/2023 CLINICAL DATA:  Cholelithiasis.  Abdominal pain. EXAM: MRI ABDOMEN WITHOUT AND WITH CONTRAST (INCLUDING MRCP) TECHNIQUE: Multiplanar multisequence MR imaging of the abdomen was performed both before and after the administration of intravenous contrast. Heavily T2-weighted images of the biliary and pancreatic ducts were obtained, and three-dimensional MRCP images were rendered by post processing. CONTRAST:  10mL GADAVIST GADOBUTROL 1 MMOL/ML IV SOLN COMPARISON:  CT chest abdomen pelvis 11/13/2023 FINDINGS: Lower chest:  Lung bases are  clear. Hepatobiliary: No intrahepatic biliary duct dilatation. There is small amount of pericholecystic fluid. There multiple small gallstones in lumen the gallbladder. The small stones measure approximately 3 mm each in too numerous to count. Gallbladder is distended to 5.0 cm. The common bile duct is not dilated however there is a filling defect within the most distal common bile duct just above the ampulla (image 23/2 on T2 weighted imaging). This distal  filling defect is also seen on the heavily T2 weighted MRCP sequence image 9/series 10. This small presumed distal gallstone is similar size to the stones within the lumen the gallbladder. Pancreas: No pancreatic duct dilatation. No pancreatic inflammation. Spleen: Normal spleen. Adrenals/urinary tract: Adrenal glands and kidneys are normal. Stomach/Bowel: Stomach and limited of the small bowel is unremarkable Vascular/Lymphatic: Abdominal aortic normal caliber. No retroperitoneal periportal lymphadenopathy. Musculoskeletal: No aggressive osseous lesion IMPRESSION: 1. Pericholecystic fluid, multiple small gallstones and gallbladder distension are concerning for acute cholecystitis. Recommend clinical correlation. 2. Small filling defect in the most distal common bile duct consistent with choledocholithiasis. No extrahepatic biliary duct dilatation. 3. No evidence of pancreatitis. Electronically Signed   By: Jackquline Boxer M.D.   On: 11/13/2023 12:48   MR 3D Recon At Scanner Result Date: 11/13/2023 CLINICAL DATA:  Cholelithiasis.  Abdominal pain. EXAM: MRI ABDOMEN WITHOUT AND WITH CONTRAST (INCLUDING MRCP) TECHNIQUE: Multiplanar multisequence MR imaging of the abdomen was performed both before and after the administration of intravenous contrast. Heavily T2-weighted images of the biliary and pancreatic ducts were obtained, and three-dimensional MRCP images were rendered by post processing. CONTRAST:  10mL GADAVIST GADOBUTROL 1 MMOL/ML IV SOLN COMPARISON:  CT  chest abdomen pelvis 11/13/2023 FINDINGS: Lower chest:  Lung bases are clear. Hepatobiliary: No intrahepatic biliary duct dilatation. There is small amount of pericholecystic fluid. There multiple small gallstones in lumen the gallbladder. The small stones measure approximately 3 mm each in too numerous to count. Gallbladder is distended to 5.0 cm. The common bile duct is not dilated however there is a filling defect within the most distal common bile duct just above the ampulla (image 23/2 on T2 weighted imaging). This distal filling defect is also seen on the heavily T2 weighted MRCP sequence image 9/series 10. This small presumed distal gallstone is similar size to the stones within the lumen the gallbladder. Pancreas: No pancreatic duct dilatation. No pancreatic inflammation. Spleen: Normal spleen. Adrenals/urinary tract: Adrenal glands and kidneys are normal. Stomach/Bowel: Stomach and limited of the small bowel is unremarkable Vascular/Lymphatic: Abdominal aortic normal caliber. No retroperitoneal periportal lymphadenopathy. Musculoskeletal: No aggressive osseous lesion IMPRESSION: 1. Pericholecystic fluid, multiple small gallstones and gallbladder distension are concerning for acute cholecystitis. Recommend clinical correlation. 2. Small filling defect in the most distal common bile duct consistent with choledocholithiasis. No extrahepatic biliary duct dilatation. 3. No evidence of pancreatitis. Electronically Signed   By: Jackquline Boxer M.D.   On: 11/13/2023 12:48   CT Angio Chest/Abd/Pel for Dissection W and/or W/WO Result Date: 11/13/2023 EXAM: CTA CHEST, ABDOMEN AND PELVIS WITHOUT AND WITH IV CONTRAST 11/13/2023 06:51:26 AM TECHNIQUE: CTA of the chest was performed without and with the administration of 100 mL of iohexol  (OMNIPAQUE ) 350 MG/ML injection. CTA of the abdomen and pelvis was performed without and with the administration of 100 mL of iohexol  (OMNIPAQUE ) 350 MG/ML injection. Multiplanar  reformatted images are provided for review. MIP images are provided for review. Automated exposure control, iterative reconstruction, and/or weight based adjustment of the mA/kV was utilized to reduce the radiation dose to as low as reasonably achievable. COMPARISON: None available. CLINICAL HISTORY: Acute aortic syndrome (AAS) suspected; acute generalized abdominal pain, hx/o afib. Scan due to suspected acute aortic syndrome. Hx/o afib. Per triage notes: Pt arrived from home via POV c/o RUQ abd pain 10/10 that began at 2000 11/13/2023 with multiple emesis episodes. Pt also noted to have new protrusion the size of a racket ball that pt states popped out after emesis episodes.  FINDINGS: VASCULATURE: AORTA: Aortic atherosclerosis. 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 Stable in the interval. The infrarenal abdominal aorta measures 5.2 cm in maximum AP dimension, image 207/11. Calcified plaque is noted within the abdominal aorta. No dissection. PULMONARY ARTERIES: The main pulmonary artery measures 4.5 cm in diameter. No pulmonary embolism within the limits of this exam. GREAT VESSELS OF AORTIC ARCH: No acute finding. No dissection. No arterial occlusion or significant stenosis. CELIAC TRUNK: No acute finding. No occlusion or significant stenosis. SUPERIOR MESENTERIC ARTERY: No acute finding. No occlusion or significant stenosis. INFERIOR MESENTERIC ARTERY: No acute finding. No occlusion or significant stenosis. RENAL ARTERIES: No acute finding. No occlusion or significant stenosis. ILIAC ARTERIES: The left common iliac artery measures 2.1 cm, image 233/11. The right common iliac artery measures up to 2.6 cm. No occlusion or significant stenosis. CHEST: MEDIASTINUM: Cardiac enlargement. Status post CABG. No pericardial effusion. No mediastinal lymphadenopathy. LUNGS AND PLEURA: Atelectasis versus scar within the inferior lingula. No focal consolidation or pulmonary edema. No evidence of pleural  effusion or pneumothorax. THORACIC BONES AND SOFT TISSUES: Status post median sternotomy. No acute osseous findings. No acute soft tissue abnormality. ABDOMEN AND PELVIS: LIVER: The liver is unremarkable. GALLBLADDER AND BILE DUCTS: Gallstones. Stone within the cystic duct is identified measuring 5 mm. There are several calcifications identified along the course of the nondilated common bile duct within the head of pancreas, image 98/13, suspicious for choledocholithiasis. SPLEEN: The spleen is unremarkable. PANCREAS: The pancreas is unremarkable. ADRENAL GLANDS: Bilateral adrenal glands demonstrate no acute abnormality. KIDNEYS, URETERS AND BLADDER: Simple cyst arising off the medial cortex of right kidney measures 1.7 cm, image 162/11. No stones in the kidneys or ureters. No hydronephrosis. No perinephric or periureteral stranding. Urinary bladder is unremarkable. GI AND BOWEL: Stomach and duodenal sweep demonstrate no acute abnormality. There is no bowel obstruction. No abnormal bowel wall thickening or distension. REPRODUCTIVE: Prostate gland enlargement with multiple brachytherapy seeds noted. PERITONEUM AND RETROPERITONEUM: No ascites or free air. No free fluid or fluid collections. LYMPH NODES: No lymphadenopathy. ABDOMINAL BONES AND SOFT TISSUES: 6.1 cm fat containing umbilical hernia. No acute abnormality of the bones. No acute soft tissue abnormality. IMPRESSION: 1. No evidence of acute aortic syndrome. 2. Infrarenal abdominal aortic aneurysm measuring 5.2 cm with calcified plaque. According to consensus criteria, vascular consultation is advised as well as follow-up imaging every 6 months. 3. Gallstones with a 5 mm cystic duct stone and additional calcifications along the nondilated common bile duct suspicious for choledocholithiasis. 4. 6.1 cm fat-containing umbilical hernia. 5. Dilated main pulmonary artery concerning for pulmonary artery hypertension. Electronically signed by: Waddell Calk MD  11/13/2023 07:08 AM EDT RP Workstation: [id number]    Review of Systems  Gastrointestinal:  Positive for abdominal pain.  All other systems reviewed and are negative.  Blood pressure (!) 141/67, pulse 60, temperature 98 F (36.7 C), resp. rate 16, height 6' 2 (1.88 m), weight (!) 146 kg, SpO2 98%. Physical Exam Vitals reviewed.  HENT:     Head: Normocephalic.  Cardiovascular:     Rate and Rhythm: Normal rate.  Pulmonary:     Effort: Pulmonary effort is normal.  Abdominal:     General: Abdomen is flat and protuberant.     Tenderness: There is abdominal tenderness in the right upper quadrant.     Comments: Reducible umbilical hernia noted.  Mild tenderness to palpation right upper quadrant without Murphy sign  Skin:    General: Skin is warm.  Neurological:  General: No focal deficit present.     Mental Status: He is alert.  Psychiatric:        Mood and Affect: Mood normal.     Assessment/Plan: Cholelithiasis/choledocholithiasis and possible acute cholecystitis-GI has been consulted.  MRCP shows distal common bile duct stone therefore consideration of ERCP.  He will need laparoscopic cholecystectomy during this admission.  Surgery will follow-up Monday.  Reviewed cholecystectomy and the rationale for doing that during this admission.  Discussed the pros and cons of surgery as well as complications and expected postop recovery.  Jaziyah Gradel A Keiondre Colee 11/13/2023, 2:05 PM   High complexity

## 2023-11-13 NOTE — ED Provider Notes (Signed)
 [place] EMERGENCY DEPARTMENT AT [facility] Provider Note   CSN: [id number] Arrival date & time: 11/13/23  0121     Patient presents with: Abdominal Pain and Emesis   Jacob Small is a 68 y.o. male.   The history is provided by the patient.  Abdominal Pain Associated symptoms: vomiting   Emesis Associated symptoms: abdominal pain   Jacob Small is a 68 y.o. male who presents to the Emergency Department complaining of abdominal pain. He presents the emergency department for evaluation of generalized abdominal pain and vomiting that started yesterday. He also reports that two weeks ago he had a similar but briefer episode. He did have his hernia sticking out at that time when he had pain. Hernia is again sticking out today. No associate fever, diarrhea. No chest pain or difficulty breathing. He has a history of atrial fibrillation on anticoagulation, thoracic aortic aneurysm repair, coronary artery disease.       Prior to Admission medications   Medication Sig Start Date End Date Taking? Authorizing Provider  amLODipine  (NORVASC ) 5 MG tablet TAKE 1 TABLET BY MOUTH DAILY. 11/08/23   Thukkani, Arun K, MD  apixaban  (ELIQUIS ) 5 MG TABS tablet Take 1 tablet (5 mg total) by mouth 2 (two) times daily. 11/05/22   Dann Candyce RAMAN, MD  carvedilol  (COREG ) 6.25 MG tablet TAKE 1 TABLET BY MOUTH 2 TIMES DAILY WITH A MEAL. 11/08/23   Thukkani, Arun K, MD  dexamethasone  (DECADRON ) 0.1 % ophthalmic solution 2 drops See admin instructions. Instill 2 drops into both ears daily as needed for irritation Patient not taking: Reported on 11/11/2023    [provider]  losartan -hydrochlorothiazide  (HYZAAR) 100-25 MG tablet TAKE 1 TABLET BY MOUTH EVERY DAY 10/31/23   Thukkani, Arun K, MD  minocycline (MINOCIN) 100 MG capsule Take 100 mg by mouth daily. Patient not taking: Reported on 11/11/2023 07/13/23   [provider]  rosuvastatin  (CRESTOR ) 5 MG tablet TAKE 1 TABLET BY  MOUTH THREE TIMES A WEEK 11/05/22   Dann Candyce RAMAN, MD  tamsulosin (FLOMAX) 0.4 MG CAPS capsule Take 0.4 mg by mouth daily. 04/28/23   [provider]    Allergies: Lisinopril and Metoprolol    Review of Systems  Gastrointestinal:  Positive for abdominal pain and vomiting.  All other systems reviewed and are negative.   Updated Vital Signs BP 124/70 (BP Location: Left Arm)   Pulse (!) 50   Temp 98.3 F (36.8 C) (Oral)   Resp 16   Ht 6' 2 (1.88 m)   Wt (!) 146 kg   SpO2 97%   BMI 41.33 kg/m   Physical Exam Vitals and nursing note reviewed.  Constitutional:      Appearance: He is well-developed.  HENT:     Head: Normocephalic and atraumatic.  Cardiovascular:     Rate and Rhythm: Normal rate and regular rhythm.     Heart sounds: No murmur heard. Pulmonary:     Effort: Pulmonary effort is normal. No respiratory distress.     Breath sounds: Normal breath sounds.  Abdominal:     Palpations: Abdomen is soft.     Tenderness: There is no guarding or rebound.     Comments: Generalized abdominal tenderness. Umbilical hernia that is soft and easily reduced  Musculoskeletal:        General: No tenderness.     Comments: 1+ edema to bilateral lower extremities  Skin:    General: Skin is warm and dry.  Neurological:  Mental Status: He is alert and oriented to person, place, and time.  Psychiatric:        Behavior: Behavior normal.     (all labs ordered are listed, but only abnormal results are displayed) Labs Reviewed  URINALYSIS, ROUTINE W REFLEX MICROSCOPIC - Abnormal; Notable for the following components:      Result Value   APPearance HAZY (*)    All other components within normal limits  LIPASE, BLOOD  COMPREHENSIVE METABOLIC PANEL WITH GFR  CBC    EKG: None  Radiology: No results found.   Procedures   Medications Ordered in the ED - No data to display                                  Medical Decision Making Amount and/or Complexity  of Data Reviewed Labs: ordered. Radiology: ordered.  Risk Prescription drug management.   Patient with a fib, aortic aneurysms status post repair here for evaluation of generalized abdominal pain. He does have an umbilical hernia that is soft and easily reduced. CTA was obtained given his history, which is negative for ischemia or dissection but is significant for cholelithiasis and concern for choledocolithiasis. Patient has partial improvement in symptoms after pain medications. Discussed with patient findings of studies and recommendation for admission and he is in agreement with plan. CBC with leukocytosis. Will start on antibiotics. Plan to admit for ongoing care. Patient care transferred pending admission consult.     Final diagnoses:  None    ED Discharge Orders     None          Griselda Norris, MD 11/13/23 [phone]

## 2023-11-13 NOTE — H&P (Signed)
 History and Physical  Jacob Small FMW:[id number] DOB: 18-Oct-1955 DOA: 11/13/2023  PCP: Auston Opal, DO   Chief Complaint: Abdominal pain, vomiting  HPI: Jacob Small is a 68 y.o. male with medical history significant for CAD, hypertension, hyperlipidemia, atrial fibrillation on Eliquis  being admitted to the hospital with 1 day of abdominal pain, vomiting found to have choledocholithiasis.  States that a couple weeks ago he had some epigastric abdominal pain which resolved spontaneously.  Last night, he had sudden onset of recurrence of the same pain, with associated vomiting.  Pain was much more severe at this time.  He denies any chest pain, fevers, chills, hematemesis.  Review of Systems: Please see HPI for pertinent positives and negatives. A complete 10 system review of systems are otherwise negative.  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)   Past Surgical History:  Procedure Laterality Date   AORTIC ROOT REPLACEMENT  09/22/2010   Median sternotomy for valve-sparing aortic root replacemnt Charolotte I reimplanation technique) with resection of engrafting of the ascending thoracic aorta .Partial circulatoru arrest, coronaru aartery bypass grafting X2 (left intrnal mammary artery to distal left anterior descending coronary artery, saphenous vein graft to posterrior descending coronary artery, EVH rt thigh   CARDIOVERSION N/A 05/09/2023   Procedure: CARDIOVERSION;  Surgeon: Francyne Headland,  MD;  Location: [facility] INVASIVE CV LAB;  Service: Cardiovascular;  Laterality: N/A;   CORONARY ARTERY BYPASS GRAFT  09/22/2010   CABGx2 with LIMA to LAD, SVG to PDA   CORONARY ARTERY BYPASS GRAFT     CYSTOSCOPY WITH INSERTION OF UROLIFT N/A 03/01/2019   Procedure: CYSTOSCOPY WITH INSERTION OF UROLIFT;  Surgeon: Kassie Ozell SAUNDERS, MD;  Location: [facility] ORS;  Service: Urology;  Laterality: N/A;   EXTERNAL EAR SURGERY     HAND SURGERY     KNEE DEBRIDEMENT     02/2008 CHONDROPLASTY/DEB. OF MENISCUS   PILONIDAL CYST EXCISION     PROSTATE SURGERY  09/30/10   BX  2010 NEG   Social History:  reports that he quit smoking about 28 years ago. His smoking use included cigarettes. He has never used smokeless tobacco. He reports that he does not currently use alcohol. He reports that he does not use drugs.  Allergies  Allergen Reactions   Lisinopril Cough   Metoprolol Cough     Fatigue     Family History  Problem Relation Age of Onset   Heart disease Father    Cancer Brother        skin     Prior to Admission medications   Medication Sig Start Date End Date Taking? Authorizing Provider  amLODipine  (NORVASC ) 5 MG tablet TAKE 1 TABLET BY MOUTH DAILY. 11/08/23   Thukkani, Arun K, MD  apixaban  (ELIQUIS ) 5 MG TABS tablet Take 1 tablet (5 mg total) by mouth 2 (two) times daily. 11/05/22   Dann Candyce RAMAN, MD  carvedilol  (COREG )  6.25 MG tablet TAKE 1 TABLET BY MOUTH 2 TIMES DAILY WITH A MEAL. 11/08/23   Thukkani, Arun K, MD  dexamethasone  (DECADRON ) 0.1 % ophthalmic solution 2 drops See admin instructions. Instill 2 drops into both ears daily as needed for irritation Patient not taking: Reported on 11/11/2023    [provider]  losartan -hydrochlorothiazide  (HYZAAR) 100-25 MG tablet TAKE 1 TABLET BY MOUTH EVERY DAY 10/31/23   Thukkani, Arun K, MD  minocycline (MINOCIN) 100 MG capsule Take 100 mg by mouth daily. Patient not taking: Reported on 11/11/2023 07/13/23   [provider]   rosuvastatin  (CRESTOR ) 5 MG tablet TAKE 1 TABLET BY MOUTH THREE TIMES A WEEK 11/05/22   Dann Candyce RAMAN, MD  tamsulosin (FLOMAX) 0.4 MG CAPS capsule Take 0.4 mg by mouth daily. 04/28/23   [provider]    Physical Exam: BP (!) 140/66 (BP Location: Left Arm)   Pulse 73   Temp 98.2 F (36.8 C) (Axillary)   Resp 17   Ht 6' 2 (1.88 m)   Wt (!) 146 kg   SpO2 97%   BMI 41.33 kg/m  General:  Alert, oriented, calm, in no acute distress, resting comfortably on room air Eyes: EOMI, clear conjuctivae, white sclerea Neck: supple, no masses, trachea mildline  Cardiovascular: RRR, no murmurs or rubs, no peripheral edema  Respiratory: clear to auscultation bilaterally, no wheezes, no crackles  Abdomen: soft, tender, distended, normal bowel tones heard  Skin: dry, no rashes  Musculoskeletal: no joint effusions, normal range of motion  Psychiatric: appropriate affect, normal speech  Neurologic: extraocular muscles intact, clear speech, moving all extremities with intact sensorium         Labs on Admission:  Basic Metabolic Panel: Recent Labs  Lab 11/13/23 0431  NA 133*  K 3.8  CL 98  CO2 24  GLUCOSE 147*  BUN 16  CREATININE 0.81  CALCIUM  9.5   Liver Function Tests: Recent Labs  Lab 11/13/23 0431  AST 23  ALT 14  ALKPHOS 77  BILITOT 1.5*  PROT 7.1  ALBUMIN 4.3   Recent Labs  Lab 11/13/23 0431  LIPASE 25   No results for input(s): AMMONIA in the last 168 hours. CBC: Recent Labs  Lab 11/13/23 0431  WBC 14.3*  HGB 13.9  HCT 42.7  MCV 85.9  PLT 170   Cardiac Enzymes: No results for input(s): CKTOTAL, CKMB, CKMBINDEX, TROPONINI in the last 168 hours. BNP (last 3 results) No results for input(s): BNP in the last 8760 hours.  ProBNP (last 3 results) No results for input(s): PROBNP in the last 8760 hours.  CBG: No results for input(s): GLUCAP in the last 168 hours.  Radiological Exams on Admission: CT Angio Chest/Abd/Pel for  Dissection W and/or W/WO Result Date: 11/13/2023 EXAM: CTA CHEST, ABDOMEN AND PELVIS WITHOUT AND WITH IV CONTRAST 11/13/2023 06:51:26 AM TECHNIQUE: CTA of the chest was performed without and with the administration of 100 mL of iohexol  (OMNIPAQUE ) 350 MG/ML injection. CTA of the abdomen and pelvis was performed without and with the administration of 100 mL of iohexol  (OMNIPAQUE ) 350 MG/ML injection. Multiplanar reformatted images are provided for review. MIP images are provided for review. Automated exposure control, iterative reconstruction, and/or weight based adjustment of the mA/kV was utilized to reduce the radiation dose to as low as reasonably achievable. COMPARISON: None available. CLINICAL HISTORY: Acute aortic syndrome (AAS) suspected; acute generalized abdominal pain, hx/o afib. Scan due to suspected acute aortic syndrome. Hx/o afib. Per triage notes: Pt arrived  from home via POV c/o RUQ abd pain 10/10 that began at 2000 11/13/2023 with multiple emesis episodes. Pt also noted to have new protrusion the size of a racket ball that pt states popped out after emesis episodes. FINDINGS: VASCULATURE: AORTA: Aortic atherosclerosis. 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 Stable in the interval. The infrarenal abdominal aorta measures 5.2 cm in maximum AP dimension, image 207/11. Calcified plaque is noted within the abdominal aorta. No dissection. PULMONARY ARTERIES: The main pulmonary artery measures 4.5 cm in diameter. No pulmonary embolism within the limits of this exam. GREAT VESSELS OF AORTIC ARCH: No acute finding. No dissection. No arterial occlusion or significant stenosis. CELIAC TRUNK: No acute finding. No occlusion or significant stenosis. SUPERIOR MESENTERIC ARTERY: No acute finding. No occlusion or significant stenosis. INFERIOR MESENTERIC ARTERY: No acute finding. No occlusion or significant stenosis. RENAL ARTERIES: No acute finding. No occlusion or significant stenosis.  ILIAC ARTERIES: The left common iliac artery measures 2.1 cm, image 233/11. The right common iliac artery measures up to 2.6 cm. No occlusion or significant stenosis. CHEST: MEDIASTINUM: Cardiac enlargement. Status post CABG. No pericardial effusion. No mediastinal lymphadenopathy. LUNGS AND PLEURA: Atelectasis versus scar within the inferior lingula. No focal consolidation or pulmonary edema. No evidence of pleural effusion or pneumothorax. THORACIC BONES AND SOFT TISSUES: Status post median sternotomy. No acute osseous findings. No acute soft tissue abnormality. ABDOMEN AND PELVIS: LIVER: The liver is unremarkable. GALLBLADDER AND BILE DUCTS: Gallstones. Stone within the cystic duct is identified measuring 5 mm. There are several calcifications identified along the course of the nondilated common bile duct within the head of pancreas, image 98/13, suspicious for choledocholithiasis. SPLEEN: The spleen is unremarkable. PANCREAS: The pancreas is unremarkable. ADRENAL GLANDS: Bilateral adrenal glands demonstrate no acute abnormality. KIDNEYS, URETERS AND BLADDER: Simple cyst arising off the medial cortex of right kidney measures 1.7 cm, image 162/11. No stones in the kidneys or ureters. No hydronephrosis. No perinephric or periureteral stranding. Urinary bladder is unremarkable. GI AND BOWEL: Stomach and duodenal sweep demonstrate no acute abnormality. There is no bowel obstruction. No abnormal bowel wall thickening or distension. REPRODUCTIVE: Prostate gland enlargement with multiple brachytherapy seeds noted. PERITONEUM AND RETROPERITONEUM: No ascites or free air. No free fluid or fluid collections. LYMPH NODES: No lymphadenopathy. ABDOMINAL BONES AND SOFT TISSUES: 6.1 cm fat containing umbilical hernia. No acute abnormality of the bones. No acute soft tissue abnormality. IMPRESSION: 1. No evidence of acute aortic syndrome. 2. Infrarenal abdominal aortic aneurysm measuring 5.2 cm with calcified plaque. According to  consensus criteria, vascular consultation is advised as well as follow-up imaging every 6 months. 3. Gallstones with a 5 mm cystic duct stone and additional calcifications along the nondilated common bile duct suspicious for choledocholithiasis. 4. 6.1 cm fat-containing umbilical hernia. 5. Dilated main pulmonary artery concerning for pulmonary artery hypertension. Electronically signed by: Waddell Calk MD 11/13/2023 07:08 AM EDT RP Workstation: [id number]   Assessment/Plan Norleen BIRCH Plant is a 68 y.o. male with medical history significant for CAD, hypertension, hyperlipidemia, atrial fibrillation on Eliquis  being admitted to the hospital with 1 day of abdominal pain, vomiting found to have choledocholithiasis.   Choledocholithiasis-with 5 mm cystic duct stone and additional calcifications along CBD which is nondilated.  No evidence of acute cholecystitis. -Inpatient admission -Clear liquid diet -Pain and nausea medication as needed -Will hold Eliquis , last dose was 9/27 AM -MRCP -Discussed with GI Dr. Dianna, who will consult  Leukocytosis-likely reactive, and has no other evidence of acute infection  Hypertension-continue home amlodipine , Hyzaar  Permanent atrial fibrillation-Coreg , holding Eliquis   Hyperlipidemia-Crestor   BPH-Flomax  DVT prophylaxis: Lovenox     Code Status: Full Code  Consults called: [facility] Dr. Dianna  Admission status: The appropriate patient status for this patient is INPATIENT. Inpatient status is judged to be reasonable and necessary in order to provide the required intensity of service to ensure the patient's safety. The patient's presenting symptoms, physical exam findings, and initial radiographic and laboratory data in the context of their chronic comorbidities is felt to place them at high risk for further clinical deterioration. Furthermore, it is not anticipated that the patient will be medically stable for discharge from the hospital within 2  midnights of admission.    I certify that at the point of admission it is my clinical judgment that the patient will require inpatient hospital care spanning beyond 2 midnights from the point of admission due to high intensity of service, high risk for further deterioration and high frequency of surveillance required  Time spent: 53 minutes  Sanjana Folz CHRISTELLA Gail MD [facility] Pager [phone]  If 7PM-7AM, please contact night-coverage [email or web address] Password [facility]  11/13/2023, 8:29 AM

## 2023-11-14 ENCOUNTER — Encounter (HOSPITAL_COMMUNITY): Payer: Self-pay | Admitting: Internal Medicine

## 2023-11-14 DIAGNOSIS — I4819 Other persistent atrial fibrillation: Secondary | ICD-10-CM | POA: Diagnosis not present

## 2023-11-14 DIAGNOSIS — I7143 Infrarenal abdominal aortic aneurysm, without rupture: Secondary | ICD-10-CM

## 2023-11-14 DIAGNOSIS — Z951 Presence of aortocoronary bypass graft: Secondary | ICD-10-CM

## 2023-11-14 DIAGNOSIS — I25709 Atherosclerosis of coronary artery bypass graft(s), unspecified, with unspecified angina pectoris: Secondary | ICD-10-CM | POA: Diagnosis not present

## 2023-11-14 DIAGNOSIS — E782 Mixed hyperlipidemia: Secondary | ICD-10-CM

## 2023-11-14 DIAGNOSIS — N400 Enlarged prostate without lower urinary tract symptoms: Secondary | ICD-10-CM

## 2023-11-14 DIAGNOSIS — I714 Abdominal aortic aneurysm, without rupture, unspecified: Secondary | ICD-10-CM | POA: Diagnosis present

## 2023-11-14 DIAGNOSIS — K8042 Calculus of bile duct with acute cholecystitis without obstruction: Principal | ICD-10-CM | POA: Diagnosis present

## 2023-11-14 DIAGNOSIS — I10 Essential (primary) hypertension: Secondary | ICD-10-CM

## 2023-11-14 LAB — HIV ANTIBODY (ROUTINE TESTING W REFLEX): HIV Screen 4th Generation wRfx: NONREACTIVE

## 2023-11-14 LAB — HEPATIC FUNCTION PANEL
ALT: 14 U/L (ref 0–44)
AST: 20 U/L (ref 15–41)
Albumin: 4.1 g/dL (ref 3.5–5.0)
Alkaline Phosphatase: 69 U/L (ref 38–126)
Bilirubin, Direct: 0.6 mg/dL — ABNORMAL HIGH (ref 0.0–0.2)
Indirect Bilirubin: 3.3 mg/dL — ABNORMAL HIGH (ref 0.3–0.9)
Total Bilirubin: 3.9 mg/dL — ABNORMAL HIGH (ref 0.0–1.2)
Total Protein: 6.9 g/dL (ref 6.5–8.1)

## 2023-11-14 LAB — CBC
HCT: 40.3 % (ref 39.0–52.0)
Hemoglobin: 13.6 g/dL (ref 13.0–17.0)
MCH: 29.5 pg (ref 26.0–34.0)
MCHC: 33.7 g/dL (ref 30.0–36.0)
MCV: 87.4 fL (ref 80.0–100.0)
Platelets: 153 K/uL (ref 150–400)
RBC: 4.61 MIL/uL (ref 4.22–5.81)
RDW: 13.4 % (ref 11.5–15.5)
WBC: 13.9 K/uL — ABNORMAL HIGH (ref 4.0–10.5)
nRBC: 0 % (ref 0.0–0.2)

## 2023-11-14 LAB — BASIC METABOLIC PANEL WITH GFR
Anion gap: 12 (ref 5–15)
BUN: 10 mg/dL (ref 8–23)
CO2: 25 mmol/L (ref 22–32)
Calcium: 9.3 mg/dL (ref 8.9–10.3)
Chloride: 98 mmol/L (ref 98–111)
Creatinine, Ser: 0.85 mg/dL (ref 0.61–1.24)
GFR, Estimated: >60 mL/min (ref >60)
Glucose, Bld: 131 mg/dL — ABNORMAL HIGH (ref 70–99)
Potassium: 3.7 mmol/L (ref 3.5–5.1)
Sodium: 135 mmol/L (ref 135–145)

## 2023-11-14 MED ORDER — SODIUM CHLORIDE 0.9 % IV SOLN: INTRAVENOUS | Status: AC

## 2023-11-14 MED ORDER — SODIUM CHLORIDE 0.9 % IV SOLN: INTRAVENOUS | Status: DC

## 2023-11-14 MED ORDER — PIPERACILLIN-TAZOBACTAM 3.375 G IVPB
3.3750 g | Freq: Three times a day (TID) | INTRAVENOUS | Status: DC
  Administered 2023-11-14 – 2023-11-17 (×10): 3.375 g via INTRAVENOUS
  Filled 2023-11-14 (×10): qty 50

## 2023-11-14 NOTE — Plan of Care (Signed)

## 2023-11-14 NOTE — Progress Notes (Signed)
 UNASSIGNED PATIENT Subjective: Patient seems to be doing fairly well today.  He has had some mild abdominal pain but denies having nausea or vomiting.  He is awaiting an ERCP tomorrow.  Objective: Vital signs in last 24 hours: Temp:  [97.5 F (36.4 C)-98.9 F (37.2 C)] 98.8 F (37.1 C) (09/29 1413) Pulse Rate:  [65-82] 71 (09/29 1413) Resp:  [16-19] 18 (09/29 1413) BP: (104-143)/(53-76) 133/53 (09/29 1413) SpO2:  [96 %-99 %] 99 % (09/29 1413) Last BM Date : 11/14/23  Intake/Output from previous day: 09/28 0701 - 09/29 0700 In: 1219.1 [P.O.:360; I.V.:809.1; IV Piggyback:50] Out: 500 [Urine:500] Intake/Output this shift: Total I/O In: 921.7 [P.O.:860; I.V.:11.7; IV Piggyback:50] Out: 300 [Urine:300]  General appearance: alert, cooperative, and morbidly obese Resp: clear to auscultation bilaterally Cardio: regular rate and rhythm, S1, S2 normal, no murmur, click, rub or gallop GI: soft, morbidly obese. non-tender; bowel sounds normal; no masses,  no organomegaly  Lab Results: Recent Labs    11/13/23 0431 11/14/23 0423  WBC 14.3* 13.9*  HGB 13.9 13.6  HCT 42.7 40.3  PLT 170 153   BMET Recent Labs    11/13/23 0431 11/14/23 0423  NA 133* 135  K 3.8 3.7  CL 98 98  CO2 24 25  GLUCOSE 147* 131*  BUN 16 10  CREATININE 0.81 0.85  CALCIUM  9.5 9.3   LFT Recent Labs    11/14/23 0423  PROT 6.9  ALBUMIN 4.1  AST 20  ALT 14  ALKPHOS 69  BILITOT 3.9*  BILIDIR 0.6*  IBILI 3.3*   Studies/Results: MR ABDOMEN MRCP W WO CONTAST Result Date: 11/13/2023 CLINICAL DATA:  Cholelithiasis.  Abdominal pain. EXAM: MRI ABDOMEN WITHOUT AND WITH CONTRAST (INCLUDING MRCP) TECHNIQUE: Multiplanar multisequence MR imaging of the abdomen was performed both before and after the administration of intravenous contrast. Heavily T2-weighted images of the biliary and pancreatic ducts were obtained, and three-dimensional MRCP images were rendered by post processing. CONTRAST:  10mL GADAVIST  GADOBUTROL 1 MMOL/ML IV SOLN COMPARISON:  CT chest abdomen pelvis 11/13/2023 FINDINGS: Lower chest:  Lung bases are clear. Hepatobiliary: No intrahepatic biliary duct dilatation. There is small amount of pericholecystic fluid. There multiple small gallstones in lumen the gallbladder. The small stones measure approximately 3 mm each in too numerous to count. Gallbladder is distended to 5.0 cm. The common bile duct is not dilated however there is a filling defect within the most distal common bile duct just above the ampulla (image 23/2 on T2 weighted imaging). This distal filling defect is also seen on the heavily T2 weighted MRCP sequence image 9/series 10. This small presumed distal gallstone is similar size to the stones within the lumen the gallbladder. Pancreas: No pancreatic duct dilatation. No pancreatic inflammation. Spleen: Normal spleen. Adrenals/urinary tract: Adrenal glands and kidneys are normal. Stomach/Bowel: Stomach and limited of the small bowel is unremarkable Vascular/Lymphatic: Abdominal aortic normal caliber. No retroperitoneal periportal lymphadenopathy. Musculoskeletal: No aggressive osseous lesion IMPRESSION: 1. Pericholecystic fluid, multiple small gallstones and gallbladder distension are concerning for acute cholecystitis. Recommend clinical correlation. 2. Small filling defect in the most distal common bile duct consistent with choledocholithiasis. No extrahepatic biliary duct dilatation. 3. No evidence of pancreatitis. Electronically Signed   By: Jackquline Boxer M.D.   On: 11/13/2023 12:48   MR 3D Recon At Scanner Result Date: 11/13/2023 CLINICAL DATA:  Cholelithiasis.  Abdominal pain. EXAM: MRI ABDOMEN WITHOUT AND WITH CONTRAST (INCLUDING MRCP) TECHNIQUE: Multiplanar multisequence MR imaging of the abdomen was performed both before and after  the administration of intravenous contrast. Heavily T2-weighted images of the biliary and pancreatic ducts were obtained, and three-dimensional  MRCP images were rendered by post processing. CONTRAST:  10mL GADAVIST GADOBUTROL 1 MMOL/ML IV SOLN COMPARISON:  CT chest abdomen pelvis 11/13/2023 FINDINGS: Lower chest:  Lung bases are clear. Hepatobiliary: No intrahepatic biliary duct dilatation. There is small amount of pericholecystic fluid. There multiple small gallstones in lumen the gallbladder. The small stones measure approximately 3 mm each in too numerous to count. Gallbladder is distended to 5.0 cm. The common bile duct is not dilated however there is a filling defect within the most distal common bile duct just above the ampulla (image 23/2 on T2 weighted imaging). This distal filling defect is also seen on the heavily T2 weighted MRCP sequence image 9/series 10. This small presumed distal gallstone is similar size to the stones within the lumen the gallbladder. Pancreas: No pancreatic duct dilatation. No pancreatic inflammation. Spleen: Normal spleen. Adrenals/urinary tract: Adrenal glands and kidneys are normal. Stomach/Bowel: Stomach and limited of the small bowel is unremarkable Vascular/Lymphatic: Abdominal aortic normal caliber. No retroperitoneal periportal lymphadenopathy. Musculoskeletal: No aggressive osseous lesion IMPRESSION: 1. Pericholecystic fluid, multiple small gallstones and gallbladder distension are concerning for acute cholecystitis. Recommend clinical correlation. 2. Small filling defect in the most distal common bile duct consistent with choledocholithiasis. No extrahepatic biliary duct dilatation. 3. No evidence of pancreatitis. Electronically Signed   By: Jackquline Boxer M.D.   On: 11/13/2023 12:48   CT Angio Chest/Abd/Pel for Dissection W and/or W/WO Result Date: 11/13/2023 EXAM: CTA CHEST, ABDOMEN AND PELVIS WITHOUT AND WITH IV CONTRAST 11/13/2023 06:51:26 AM TECHNIQUE: CTA of the chest was performed without and with the administration of 100 mL of iohexol  (OMNIPAQUE ) 350 MG/ML injection. CTA of the abdomen and pelvis was  performed without and with the administration of 100 mL of iohexol  (OMNIPAQUE ) 350 MG/ML injection. Multiplanar reformatted images are provided for review. MIP images are provided for review. Automated exposure control, iterative reconstruction, and/or weight based adjustment of the mA/kV was utilized to reduce the radiation dose to as low as reasonably achievable. COMPARISON: None available. CLINICAL HISTORY: Acute aortic syndrome (AAS) suspected; acute generalized abdominal pain, hx/o afib. Scan due to suspected acute aortic syndrome. Hx/o afib. Per triage notes: Pt arrived from home via POV c/o RUQ abd pain 10/10 that began at 2000 11/13/2023 with multiple emesis episodes. Pt also noted to have new protrusion the size of a racket ball that pt states popped out after emesis episodes. FINDINGS: VASCULATURE: AORTA: Aortic atherosclerosis. 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 Stable in the interval. The infrarenal abdominal aorta measures 5.2 cm in maximum AP dimension, image 207/11. Calcified plaque is noted within the abdominal aorta. No dissection. PULMONARY ARTERIES: The main pulmonary artery measures 4.5 cm in diameter. No pulmonary embolism within the limits of this exam. GREAT VESSELS OF AORTIC ARCH: No acute finding. No dissection. No arterial occlusion or significant stenosis. CELIAC TRUNK: No acute finding. No occlusion or significant stenosis. SUPERIOR MESENTERIC ARTERY: No acute finding. No occlusion or significant stenosis. INFERIOR MESENTERIC ARTERY: No acute finding. No occlusion or significant stenosis. RENAL ARTERIES: No acute finding. No occlusion or significant stenosis. ILIAC ARTERIES: The left common iliac artery measures 2.1 cm, image 233/11. The right common iliac artery measures up to 2.6 cm. No occlusion or significant stenosis. CHEST: MEDIASTINUM: Cardiac enlargement. Status post CABG. No pericardial effusion. No mediastinal lymphadenopathy. LUNGS AND PLEURA:  Atelectasis versus scar within the inferior lingula. No focal  consolidation or pulmonary edema. No evidence of pleural effusion or pneumothorax. THORACIC BONES AND SOFT TISSUES: Status post median sternotomy. No acute osseous findings. No acute soft tissue abnormality. ABDOMEN AND PELVIS: LIVER: The liver is unremarkable. GALLBLADDER AND BILE DUCTS: Gallstones. Stone within the cystic duct is identified measuring 5 mm. There are several calcifications identified along the course of the nondilated common bile duct within the head of pancreas, image 98/13, suspicious for choledocholithiasis. SPLEEN: The spleen is unremarkable. PANCREAS: The pancreas is unremarkable. ADRENAL GLANDS: Bilateral adrenal glands demonstrate no acute abnormality. KIDNEYS, URETERS AND BLADDER: Simple cyst arising off the medial cortex of right kidney measures 1.7 cm, image 162/11. No stones in the kidneys or ureters. No hydronephrosis. No perinephric or periureteral stranding. Urinary bladder is unremarkable. GI AND BOWEL: Stomach and duodenal sweep demonstrate no acute abnormality. There is no bowel obstruction. No abnormal bowel wall thickening or distension. REPRODUCTIVE: Prostate gland enlargement with multiple brachytherapy seeds noted. PERITONEUM AND RETROPERITONEUM: No ascites or free air. No free fluid or fluid collections. LYMPH NODES: No lymphadenopathy. ABDOMINAL BONES AND SOFT TISSUES: 6.1 cm fat containing umbilical hernia. No acute abnormality of the bones. No acute soft tissue abnormality. IMPRESSION: 1. No evidence of acute aortic syndrome. 2. Infrarenal abdominal aortic aneurysm measuring 5.2 cm with calcified plaque. According to consensus criteria, vascular consultation is advised as well as follow-up imaging every 6 months. 3. Gallstones with a 5 mm cystic duct stone and additional calcifications along the nondilated common bile duct suspicious for choledocholithiasis. 4. 6.1 cm fat-containing umbilical hernia. 5. Dilated  main pulmonary artery concerning for pulmonary artery hypertension. Electronically signed by: Waddell Calk MD 11/13/2023 07:08 AM EDT RP Workstation: [id number]    Medications: I have reviewed the patient's current medications. Prior to Admission:  Medications Prior to Admission  Medication Sig Dispense Refill Last Dose/Taking   amLODipine  (NORVASC ) 5 MG tablet TAKE 1 TABLET BY MOUTH DAILY. 90 tablet 2 11/12/2023   apixaban  (ELIQUIS ) 5 MG TABS tablet Take 1 tablet (5 mg total) by mouth 2 (two) times daily. 56 tablet 0 11/12/2023 at  4:30 AM   carvedilol  (COREG ) 6.25 MG tablet TAKE 1 TABLET BY MOUTH 2 TIMES DAILY WITH A MEAL. 180 tablet 2 11/12/2023   losartan -hydrochlorothiazide  (HYZAAR) 100-25 MG tablet TAKE 1 TABLET BY MOUTH EVERY DAY 90 tablet 2 11/12/2023   rosuvastatin  (CRESTOR ) 5 MG tablet TAKE 1 TABLET BY MOUTH THREE TIMES A WEEK (Patient taking differently: Take 5 mg by mouth See admin instructions. TAKE 1 TABLET BY MOUTH THREE TIMES A WEEK) 36 tablet 3 11/12/2023   tamsulosin (FLOMAX) 0.4 MG CAPS capsule Take 0.4 mg by mouth daily.   11/12/2023   Scheduled:  amLODipine   5 mg Oral Daily   carvedilol   6.25 mg Oral BID WC   enoxaparin (LOVENOX) injection  40 mg Subcutaneous Q24H   losartan   100 mg Oral Daily   And   hydrochlorothiazide   25 mg Oral Daily   rosuvastatin   5 mg Oral Q M,W,F   tamsulosin  0.4 mg Oral Daily   Continuous:  sodium chloride  20 mL/hr at 11/14/23 1337   sodium chloride  Stopped (11/14/23 0836)   piperacillin-tazobactam (ZOSYN)  IV 3.375 g (11/14/23 1628)   PRN:acetaminophen  **OR** acetaminophen , albuterol, morphine injection, ondansetron  **OR** ondansetron  (ZOFRAN ) IV, oxyCODONE , traZODone  Assessment/Plan: 1) Acute cholecystitis, cholelithiasis with choledocholithiasis for ERCP tomorrow. 2) Atrial fibrillation-Eliquis  is on hold. 3) Hyperlipidemia. 4) CAD status post CABG. 5) Benign prostatic hypertrophy. 6) AAA.  LOS: 1 day  Renaye Sous 11/14/2023,  5:56 PM

## 2023-11-14 NOTE — Progress Notes (Signed)
 Progress Note     Subjective: Pt reports he is having some mild RUQ pain still. Wife at bedside as well. We reviewed biliary anatomy and MRCP findings. Discussed recommendation for laparoscopic cholecystectomy this admission and they are in agreement to proceed.   Objective: Vital signs in last 24 hours: Temp:  [97.5 F (36.4 C)-98.9 F (37.2 C)] 98.6 F (37 C) (09/29 0915) Pulse Rate:  [65-82] 72 (09/29 0915) Resp:  [16-20] 16 (09/29 0915) BP: (104-143)/(64-76) 104/65 (09/29 0915) SpO2:  [96 %-97 %] 96 % (09/29 0915) Last BM Date : 11/14/23  Intake/Output from previous day: 09/28 0701 - 09/29 0700 In: 1219.1 [P.O.:360; I.V.:809.1; IV Piggyback:50] Out: 500 [Urine:500] Intake/Output this shift: No intake/output data recorded.  PE: General: pleasant, WD, obese male, NAD HEENT: sclera anicteric  Heart: regular, rate, and rhythm.   Lungs: Respiratory effort nonlabored Abd: soft, TTP in RUQ, protuberant abdomen  Psych: A&Ox3 with an appropriate affect.    Lab Results:  Recent Labs    11/13/23 0431 11/14/23 0423  WBC 14.3* 13.9*  HGB 13.9 13.6  HCT 42.7 40.3  PLT 170 153   BMET Recent Labs    11/13/23 0431 11/14/23 0423  NA 133* 135  K 3.8 3.7  CL 98 98  CO2 24 25  GLUCOSE 147* 131*  BUN 16 10  CREATININE 0.81 0.85  CALCIUM  9.5 9.3   PT/INR No results for input(s): LABPROT, INR in the last 72 hours. CMP     Component Value Date/Time   NA 135 11/14/2023 0423   NA 139 09/20/2023 0858   K 3.7 11/14/2023 0423   CL 98 11/14/2023 0423   CO2 25 11/14/2023 0423   GLUCOSE 131 (H) 11/14/2023 0423   BUN 10 11/14/2023 0423   BUN 16 09/20/2023 0858   CREATININE 0.85 11/14/2023 0423   CALCIUM  9.3 11/14/2023 0423   PROT 6.9 11/14/2023 0423   PROT 6.4 06/17/2023 0843   ALBUMIN 4.1 11/14/2023 0423   ALBUMIN 4.2 06/17/2023 0843   AST 20 11/14/2023 0423   ALT 14 11/14/2023 0423   ALKPHOS 69 11/14/2023 0423   BILITOT 3.9 (H) 11/14/2023 0423   BILITOT  1.8 (H) 06/17/2023 0843   GFRNONAA >60 11/14/2023 0423   GFRAA 96 08/29/2019 0841   Lipase     Component Value Date/Time   LIPASE 25 11/13/2023 0431       Studies/Results: MR ABDOMEN MRCP W WO CONTAST Result Date: 11/13/2023 CLINICAL DATA:  Cholelithiasis.  Abdominal pain. EXAM: MRI ABDOMEN WITHOUT AND WITH CONTRAST (INCLUDING MRCP) TECHNIQUE: Multiplanar multisequence MR imaging of the abdomen was performed both before and after the administration of intravenous contrast. Heavily T2-weighted images of the biliary and pancreatic ducts were obtained, and three-dimensional MRCP images were rendered by post processing. CONTRAST:  10mL GADAVIST GADOBUTROL 1 MMOL/ML IV SOLN COMPARISON:  CT chest abdomen pelvis 11/13/2023 FINDINGS: Lower chest:  Lung bases are clear. Hepatobiliary: No intrahepatic biliary duct dilatation. There is small amount of pericholecystic fluid. There multiple small gallstones in lumen the gallbladder. The small stones measure approximately 3 mm each in too numerous to count. Gallbladder is distended to 5.0 cm. The common bile duct is not dilated however there is a filling defect within the most distal common bile duct just above the ampulla (image 23/2 on T2 weighted imaging). This distal filling defect is also seen on the heavily T2 weighted MRCP sequence image 9/series 10. This small presumed distal gallstone is similar size to the stones within  the lumen the gallbladder. Pancreas: No pancreatic duct dilatation. No pancreatic inflammation. Spleen: Normal spleen. Adrenals/urinary tract: Adrenal glands and kidneys are normal. Stomach/Bowel: Stomach and limited of the small bowel is unremarkable Vascular/Lymphatic: Abdominal aortic normal caliber. No retroperitoneal periportal lymphadenopathy. Musculoskeletal: No aggressive osseous lesion IMPRESSION: 1. Pericholecystic fluid, multiple small gallstones and gallbladder distension are concerning for acute cholecystitis. Recommend  clinical correlation. 2. Small filling defect in the most distal common bile duct consistent with choledocholithiasis. No extrahepatic biliary duct dilatation. 3. No evidence of pancreatitis. Electronically Signed   By: Jackquline Boxer M.D.   On: 11/13/2023 12:48   MR 3D Recon At Scanner Result Date: 11/13/2023 CLINICAL DATA:  Cholelithiasis.  Abdominal pain. EXAM: MRI ABDOMEN WITHOUT AND WITH CONTRAST (INCLUDING MRCP) TECHNIQUE: Multiplanar multisequence MR imaging of the abdomen was performed both before and after the administration of intravenous contrast. Heavily T2-weighted images of the biliary and pancreatic ducts were obtained, and three-dimensional MRCP images were rendered by post processing. CONTRAST:  10mL GADAVIST GADOBUTROL 1 MMOL/ML IV SOLN COMPARISON:  CT chest abdomen pelvis 11/13/2023 FINDINGS: Lower chest:  Lung bases are clear. Hepatobiliary: No intrahepatic biliary duct dilatation. There is small amount of pericholecystic fluid. There multiple small gallstones in lumen the gallbladder. The small stones measure approximately 3 mm each in too numerous to count. Gallbladder is distended to 5.0 cm. The common bile duct is not dilated however there is a filling defect within the most distal common bile duct just above the ampulla (image 23/2 on T2 weighted imaging). This distal filling defect is also seen on the heavily T2 weighted MRCP sequence image 9/series 10. This small presumed distal gallstone is similar size to the stones within the lumen the gallbladder. Pancreas: No pancreatic duct dilatation. No pancreatic inflammation. Spleen: Normal spleen. Adrenals/urinary tract: Adrenal glands and kidneys are normal. Stomach/Bowel: Stomach and limited of the small bowel is unremarkable Vascular/Lymphatic: Abdominal aortic normal caliber. No retroperitoneal periportal lymphadenopathy. Musculoskeletal: No aggressive osseous lesion IMPRESSION: 1. Pericholecystic fluid, multiple small gallstones and  gallbladder distension are concerning for acute cholecystitis. Recommend clinical correlation. 2. Small filling defect in the most distal common bile duct consistent with choledocholithiasis. No extrahepatic biliary duct dilatation. 3. No evidence of pancreatitis. Electronically Signed   By: Jackquline Boxer M.D.   On: 11/13/2023 12:48   CT Angio Chest/Abd/Pel for Dissection W and/or W/WO Result Date: 11/13/2023 EXAM: CTA CHEST, ABDOMEN AND PELVIS WITHOUT AND WITH IV CONTRAST 11/13/2023 06:51:26 AM TECHNIQUE: CTA of the chest was performed without and with the administration of 100 mL of iohexol  (OMNIPAQUE ) 350 MG/ML injection. CTA of the abdomen and pelvis was performed without and with the administration of 100 mL of iohexol  (OMNIPAQUE ) 350 MG/ML injection. Multiplanar reformatted images are provided for review. MIP images are provided for review. Automated exposure control, iterative reconstruction, and/or weight based adjustment of the mA/kV was utilized to reduce the radiation dose to as low as reasonably achievable. COMPARISON: None available. CLINICAL HISTORY: Acute aortic syndrome (AAS) suspected; acute generalized abdominal pain, hx/o afib. Scan due to suspected acute aortic syndrome. Hx/o afib. Per triage notes: Pt arrived from home via POV c/o RUQ abd pain 10/10 that began at 2000 11/13/2023 with multiple emesis episodes. Pt also noted to have new protrusion the size of a racket ball that pt states popped out after emesis episodes. FINDINGS: VASCULATURE: AORTA: Aortic atherosclerosis. 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 Stable in the interval. The infrarenal abdominal aorta measures 5.2 cm in maximum AP dimension,  image 207/11. Calcified plaque is noted within the abdominal aorta. No dissection. PULMONARY ARTERIES: The main pulmonary artery measures 4.5 cm in diameter. No pulmonary embolism within the limits of this exam. GREAT VESSELS OF AORTIC ARCH: No acute finding. No  dissection. No arterial occlusion or significant stenosis. CELIAC TRUNK: No acute finding. No occlusion or significant stenosis. SUPERIOR MESENTERIC ARTERY: No acute finding. No occlusion or significant stenosis. INFERIOR MESENTERIC ARTERY: No acute finding. No occlusion or significant stenosis. RENAL ARTERIES: No acute finding. No occlusion or significant stenosis. ILIAC ARTERIES: The left common iliac artery measures 2.1 cm, image 233/11. The right common iliac artery measures up to 2.6 cm. No occlusion or significant stenosis. CHEST: MEDIASTINUM: Cardiac enlargement. Status post CABG. No pericardial effusion. No mediastinal lymphadenopathy. LUNGS AND PLEURA: Atelectasis versus scar within the inferior lingula. No focal consolidation or pulmonary edema. No evidence of pleural effusion or pneumothorax. THORACIC BONES AND SOFT TISSUES: Status post median sternotomy. No acute osseous findings. No acute soft tissue abnormality. ABDOMEN AND PELVIS: LIVER: The liver is unremarkable. GALLBLADDER AND BILE DUCTS: Gallstones. Stone within the cystic duct is identified measuring 5 mm. There are several calcifications identified along the course of the nondilated common bile duct within the head of pancreas, image 98/13, suspicious for choledocholithiasis. SPLEEN: The spleen is unremarkable. PANCREAS: The pancreas is unremarkable. ADRENAL GLANDS: Bilateral adrenal glands demonstrate no acute abnormality. KIDNEYS, URETERS AND BLADDER: Simple cyst arising off the medial cortex of right kidney measures 1.7 cm, image 162/11. No stones in the kidneys or ureters. No hydronephrosis. No perinephric or periureteral stranding. Urinary bladder is unremarkable. GI AND BOWEL: Stomach and duodenal sweep demonstrate no acute abnormality. There is no bowel obstruction. No abnormal bowel wall thickening or distension. REPRODUCTIVE: Prostate gland enlargement with multiple brachytherapy seeds noted. PERITONEUM AND RETROPERITONEUM: No ascites  or free air. No free fluid or fluid collections. LYMPH NODES: No lymphadenopathy. ABDOMINAL BONES AND SOFT TISSUES: 6.1 cm fat containing umbilical hernia. No acute abnormality of the bones. No acute soft tissue abnormality. IMPRESSION: 1. No evidence of acute aortic syndrome. 2. Infrarenal abdominal aortic aneurysm measuring 5.2 cm with calcified plaque. According to consensus criteria, vascular consultation is advised as well as follow-up imaging every 6 months. 3. Gallstones with a 5 mm cystic duct stone and additional calcifications along the nondilated common bile duct suspicious for choledocholithiasis. 4. 6.1 cm fat-containing umbilical hernia. 5. Dilated main pulmonary artery concerning for pulmonary artery hypertension. Electronically signed by: Waddell Calk MD 11/13/2023 07:08 AM EDT RP Workstation: [id number]    Anti-infectives: Anti-infectives (From admission, onward)    Start     Dose/Rate Route Frequency Ordered Stop   11/14/23 0830  piperacillin-tazobactam (ZOSYN) IVPB 3.375 g        3.375 g 12.5 mL/hr over 240 Minutes Intravenous Every 8 hours 11/14/23 0812     11/13/23 0730  piperacillin-tazobactam (ZOSYN) IVPB 3.375 g        3.375 g 100 mL/hr over 30 Minutes Intravenous  Once 11/13/23 0718 11/13/23 0800        Assessment/Plan  Choledocholithiasis and probable acute cholecystitis  - CT with cystic duct stone as well as concern for choledocholithiasis - MRCP with concern for acute cholecystitis and small filling defect in distal common duct consistent with choledocholithiasis, no pancreatitis  - Tbili up to 3.9 from 1.5, although mostly indirect - WBC 13.9 from 14.3 and pt is TTP in RUQ - agree with continuing IV abx - GI note for today pending but sounds  like ERCP is planned for tomorrow  - Would plan laparoscopic cholecystectomy 10/1 as long as recovering well after ERCP - I have explained the procedure, risks, and aftercare of Laparoscopic cholecystectomy.  Risks include  but are not limited to anesthesia (MI, CVA, death, prolonged intubation and aspiration), bleeding, infection, wound problems, hernia, bile leak, injury to common bile duct/liver/intestine, possible need for subtotal cholecystectomy or open cholecystectomy, increased risk of DVT/PE and diarrhea post op.  He seems to understand and agrees to proceed.   FEN: CLD, NPO after MN, IVF per [facility] VTE: Continue to hold Eliquis , SCDs ID: [email or web address] 9/28>>  - per [facility] -  CAD s/p CABG Thoracic aortic aneurysm s/p [place]  HTN HLD Morbid obesity - BMI 41.33   LOS: 1 day   I reviewed Consultant GI notes, hospitalist notes, last 24 h vitals and pain scores, last 48 h intake and output, last 24 h labs and trends, and last 24 h imaging results.  This care required high  level of medical decision making.    Burnard JONELLE Louder, [facility] 11/14/2023, 11:46 AM Please see Amion for pager number during day hours 7:00am-4:30pm

## 2023-11-14 NOTE — Progress Notes (Signed)
 PROGRESS NOTE    Jacob Small  FMW:[id number] DOB: Jun 30, 1955 DOA: 11/13/2023 PCP: Auston Opal, DO    Chief Complaint  Patient presents with   Abdominal Pain   Emesis    Brief Narrative:  Patient 68 year old gentleman history of CAD, hypertension, hyperlipidemia, A-fib on Eliquis  presenting to the ED with 1 day of right upper quadrant abdominal pain, nausea and emesis with imaging concerning for choledocholithiasis.  MRCP obtained concerning for choledocholithiasis and acute cholecystitis possibly.  GI general surgery consulted and following.  Patient placed empirically on IV antibiotics.  CT angiogram chest abdomen and pelvis also with concerns for 5.2 cm infrarenal AAA.  Vascular surgery consulted as well.     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 - CT angiogram chest abdomen and negative for acute aortic syndrome, infrarenal AAA measuring 5.2 cm with calcified plaque, gallstones with a 5 mm cystic duct stone and additional calcifications along the nondilated common bile duct suspicious for choledocholithiasis.  6.1 cm fat-containing umbilical hernia.  Dilated main pulmonary artery concerning for pulmonary artery hypertension. - MRCP obtained with pericholecystic fluid, multiple small gallstones and gallbladder distention concerning for acute cholecystitis.  Small filling defect in the most distal common bile duct consistent  with choledocholithiasis.  No extrahepatic biliary duct dilatation.  No evidence of pancreatitis. -Patient with right upper quadrant abdominal pain, leukocytosis. - Will place patient on empiric IV Zosyn. - GI consulted and following and patient likely will need ERCP. - General Surgery consulted and patient will likely need laparoscopic cholecystectomy during this hospitalization after ERCP. - IV fluids, pain management, supportive care. - General Surgery and GI following and appreciate input and recommendations.  2.  Permanent A-fib -Continue Coreg  for rate control. - Continue to hold Eliquis  pending procedures. - GI and general surgery to advise when anticoagulation may be resumed.  3.  Hypertension - Continue Norvasc , Coreg , Cozaar , HCTZ.  4.  AAA -Patient with infrarenal AAA measuring 5.2 cm with calcified plaque noted on CT angiogram chest abdomen and pelvis. - Consult vascular surgery for further evaluation and recommendations and will likely need follow-up imaging in 6 months. - Continue current regimen of beta-blocker, ARB, statin. - Check a fasting lipid panel.  5.  Hyperlipidemia -Check a fasting lipid panel. - Continue home regimen Crestor .  6.  BPH -Continue home regimen Flomax.  7.  CAD status post CABG - Continue home regimen of Norvasc , Coreg , Cozaar , HCTZ, Crestor . - Outpatient follow-up with primary cardiologist.    DVT prophylaxis: Lovenox Code Status: Full Family Communication: Updated patient and wife at bedside. Disposition: TBD  Status is: Inpatient Remains inpatient appropriate because: Severity of illness.   Consultants:  Gastroenterology: Dr. Dianna 11/13/2023 General Surgery: Dr. Vanderbilt 11/13/2023 Vascular surgery pending  Procedures:  CT angiogram chest abdomen and pelvis 11/13/2023 MRCP 11/13/2023   Antimicrobials:  Anti-infectives (From admission, onward)    Start     Dose/Rate Route Frequency Ordered Stop   11/14/23 0830   piperacillin-tazobactam (ZOSYN) IVPB 3.375 g  3.375 g 12.5 mL/hr over 240 Minutes Intravenous Every 8 hours 11/14/23 0812     11/13/23 0730  piperacillin-tazobactam (ZOSYN) IVPB 3.375 g        3.375 g 100 mL/hr over 30 Minutes Intravenous  Once 11/13/23 0718 11/13/23 0800         Subjective: Patient sitting up in chair.  Denies any chest pain or shortness of breath.  No further nausea or emesis.  Still with right upper quadrant abdominal pain which she states is improving.  Wife at bedside.  Objective: Vitals:   11/14/23 0153 11/14/23 0546 11/14/23 0915 11/14/23 1413  BP: 128/67 130/66 104/65 (!) 133/53  Pulse: 68 71 72 71  Resp: 17 19 16 18   Temp: 98.9 F (37.2 C) 98.8 F (37.1 C) 98.6 F (37 C) 98.8 F (37.1 C)  TempSrc: Oral Oral Oral Oral  SpO2: 96% 97% 96% 99%  Weight:      Height:        Intake/Output Summary (Last 24 hours) at 11/14/2023 1605 Last data filed at 11/14/2023 1413 Gross per 24 hour  Intake 2090.85 ml  Output 650 ml  Net 1440.85 ml   Filed Weights   11/13/23 0200  Weight: (!) 146 kg    Examination:  General exam: Appears calm and comfortable  Respiratory system: Clear to auscultation. Respiratory effort normal. Cardiovascular system: S1 & S2 heard, RRR. No JVD, murmurs, rubs, gallops or clicks. No pedal edema. Gastrointestinal system: Abdomen is obese, mildly distended, soft, tender to palpation right upper quadrant and epigastrium.  Positive bowel sounds.  Reducible umbilical hernia.  No rebound.  No guarding.  Central nervous system: Alert and oriented. No focal neurological deficits. Extremities: Symmetric 5 x 5 power. Skin: No rashes, lesions or ulcers Psychiatry: Judgement and insight appear normal. Mood & affect appropriate.     Data Reviewed: I have personally reviewed following labs and imaging studies  CBC: Recent Labs  Lab 11/13/23 0431 11/14/23 0423  WBC 14.3* 13.9*  HGB 13.9 13.6  HCT 42.7 40.3  MCV 85.9 87.4  PLT  170 153    Basic Metabolic Panel: Recent Labs  Lab 11/13/23 0431 11/14/23 0423  NA 133* 135  K 3.8 3.7  CL 98 98  CO2 24 25  GLUCOSE 147* 131*  BUN 16 10  CREATININE 0.81 0.85  CALCIUM  9.5 9.3    GFR: Estimated Creatinine Clearance: 126.7 mL/min (by C-G formula based on SCr of 0.85 mg/dL).  Liver Function Tests: Recent Labs  Lab 11/13/23 0431 11/14/23 0423  AST 23 20  ALT 14 14  ALKPHOS 77 69  BILITOT 1.5* 3.9*  PROT 7.1 6.9  ALBUMIN 4.3 4.1    CBG: No results for input(s): GLUCAP in the last 168 hours.   No results found for this or any previous visit (from the past 240 hours).       Radiology Studies: MR ABDOMEN MRCP W WO CONTAST Result Date: 11/13/2023 CLINICAL DATA:  Cholelithiasis.  Abdominal pain. EXAM: MRI ABDOMEN WITHOUT AND WITH CONTRAST (INCLUDING MRCP) TECHNIQUE: Multiplanar multisequence MR imaging of the abdomen was performed both before and after the administration of intravenous contrast. Heavily T2-weighted images of the biliary and pancreatic ducts were obtained, and three-dimensional MRCP images were rendered by post processing. CONTRAST:  10mL GADAVIST GADOBUTROL 1 MMOL/ML IV SOLN COMPARISON:  CT chest abdomen pelvis 11/13/2023 FINDINGS: Lower chest:  Lung bases are clear. Hepatobiliary: No intrahepatic biliary duct dilatation. There is small amount of pericholecystic fluid. There multiple small  gallstones in lumen the gallbladder. The small stones measure approximately 3 mm each in too numerous to count. Gallbladder is distended to 5.0 cm. The common bile duct is not dilated however there is a filling defect within the most distal common bile duct just above the ampulla (image 23/2 on T2 weighted imaging). This distal filling defect is also seen on the heavily T2 weighted MRCP sequence image 9/series 10. This small presumed distal gallstone is similar size to the stones within the lumen the gallbladder. Pancreas: No pancreatic duct dilatation. No  pancreatic inflammation. Spleen: Normal spleen. Adrenals/urinary tract: Adrenal glands and kidneys are normal. Stomach/Bowel: Stomach and limited of the small bowel is unremarkable Vascular/Lymphatic: Abdominal aortic normal caliber. No retroperitoneal periportal lymphadenopathy. Musculoskeletal: No aggressive osseous lesion IMPRESSION: 1. Pericholecystic fluid, multiple small gallstones and gallbladder distension are concerning for acute cholecystitis. Recommend clinical correlation. 2. Small filling defect in the most distal common bile duct consistent with choledocholithiasis. No extrahepatic biliary duct dilatation. 3. No evidence of pancreatitis. Electronically Signed   By: Jackquline Boxer M.D.   On: 11/13/2023 12:48   MR 3D Recon At Scanner Result Date: 11/13/2023 CLINICAL DATA:  Cholelithiasis.  Abdominal pain. EXAM: MRI ABDOMEN WITHOUT AND WITH CONTRAST (INCLUDING MRCP) TECHNIQUE: Multiplanar multisequence MR imaging of the abdomen was performed both before and after the administration of intravenous contrast. Heavily T2-weighted images of the biliary and pancreatic ducts were obtained, and three-dimensional MRCP images were rendered by post processing. CONTRAST:  10mL GADAVIST GADOBUTROL 1 MMOL/ML IV SOLN COMPARISON:  CT chest abdomen pelvis 11/13/2023 FINDINGS: Lower chest:  Lung bases are clear. Hepatobiliary: No intrahepatic biliary duct dilatation. There is small amount of pericholecystic fluid. There multiple small gallstones in lumen the gallbladder. The small stones measure approximately 3 mm each in too numerous to count. Gallbladder is distended to 5.0 cm. The common bile duct is not dilated however there is a filling defect within the most distal common bile duct just above the ampulla (image 23/2 on T2 weighted imaging). This distal filling defect is also seen on the heavily T2 weighted MRCP sequence image 9/series 10. This small presumed distal gallstone is similar size to the stones within  the lumen the gallbladder. Pancreas: No pancreatic duct dilatation. No pancreatic inflammation. Spleen: Normal spleen. Adrenals/urinary tract: Adrenal glands and kidneys are normal. Stomach/Bowel: Stomach and limited of the small bowel is unremarkable Vascular/Lymphatic: Abdominal aortic normal caliber. No retroperitoneal periportal lymphadenopathy. Musculoskeletal: No aggressive osseous lesion IMPRESSION: 1. Pericholecystic fluid, multiple small gallstones and gallbladder distension are concerning for acute cholecystitis. Recommend clinical correlation. 2. Small filling defect in the most distal common bile duct consistent with choledocholithiasis. No extrahepatic biliary duct dilatation. 3. No evidence of pancreatitis. Electronically Signed   By: Jackquline Boxer M.D.   On: 11/13/2023 12:48   CT Angio Chest/Abd/Pel for Dissection W and/or W/WO Result Date: 11/13/2023 EXAM: CTA CHEST, ABDOMEN AND PELVIS WITHOUT AND WITH IV CONTRAST 11/13/2023 06:51:26 AM TECHNIQUE: CTA of the chest was performed without and with the administration of 100 mL of iohexol  (OMNIPAQUE ) 350 MG/ML injection. CTA of the abdomen and pelvis was performed without and with the administration of 100 mL of iohexol  (OMNIPAQUE ) 350 MG/ML injection. Multiplanar reformatted images are provided for review. MIP images are provided for review. Automated exposure control, iterative reconstruction, and/or weight based adjustment of the mA/kV was utilized to reduce the radiation dose to as low as reasonably achievable. COMPARISON: None available. CLINICAL HISTORY: Acute aortic syndrome (AAS) suspected; acute generalized abdominal  pain, hx/o afib. Scan due to suspected acute aortic syndrome. Hx/o afib. Per triage notes: Pt arrived from home via POV c/o RUQ abd pain 10/10 that began at 2000 11/13/2023 with multiple emesis episodes. Pt also noted to have new protrusion the size of a racket ball that pt states popped out after emesis episodes. FINDINGS:  VASCULATURE: AORTA: Aortic atherosclerosis. 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 Stable in the interval. The infrarenal abdominal aorta measures 5.2 cm in maximum AP dimension, image 207/11. Calcified plaque is noted within the abdominal aorta. No dissection. PULMONARY ARTERIES: The main pulmonary artery measures 4.5 cm in diameter. No pulmonary embolism within the limits of this exam. GREAT VESSELS OF AORTIC ARCH: No acute finding. No dissection. No arterial occlusion or significant stenosis. CELIAC TRUNK: No acute finding. No occlusion or significant stenosis. SUPERIOR MESENTERIC ARTERY: No acute finding. No occlusion or significant stenosis. INFERIOR MESENTERIC ARTERY: No acute finding. No occlusion or significant stenosis. RENAL ARTERIES: No acute finding. No occlusion or significant stenosis. ILIAC ARTERIES: The left common iliac artery measures 2.1 cm, image 233/11. The right common iliac artery measures up to 2.6 cm. No occlusion or significant stenosis. CHEST: MEDIASTINUM: Cardiac enlargement. Status post CABG. No pericardial effusion. No mediastinal lymphadenopathy. LUNGS AND PLEURA: Atelectasis versus scar within the inferior lingula. No focal consolidation or pulmonary edema. No evidence of pleural effusion or pneumothorax. THORACIC BONES AND SOFT TISSUES: Status post median sternotomy. No acute osseous findings. No acute soft tissue abnormality. ABDOMEN AND PELVIS: LIVER: The liver is unremarkable. GALLBLADDER AND BILE DUCTS: Gallstones. Stone within the cystic duct is identified measuring 5 mm. There are several calcifications identified along the course of the nondilated common bile duct within the head of pancreas, image 98/13, suspicious for choledocholithiasis. SPLEEN: The spleen is unremarkable. PANCREAS: The pancreas is unremarkable. ADRENAL GLANDS: Bilateral adrenal glands demonstrate no acute abnormality. KIDNEYS, URETERS AND BLADDER: Simple cyst arising off the medial  cortex of right kidney measures 1.7 cm, image 162/11. No stones in the kidneys or ureters. No hydronephrosis. No perinephric or periureteral stranding. Urinary bladder is unremarkable. GI AND BOWEL: Stomach and duodenal sweep demonstrate no acute abnormality. There is no bowel obstruction. No abnormal bowel wall thickening or distension. REPRODUCTIVE: Prostate gland enlargement with multiple brachytherapy seeds noted. PERITONEUM AND RETROPERITONEUM: No ascites or free air. No free fluid or fluid collections. LYMPH NODES: No lymphadenopathy. ABDOMINAL BONES AND SOFT TISSUES: 6.1 cm fat containing umbilical hernia. No acute abnormality of the bones. No acute soft tissue abnormality. IMPRESSION: 1. No evidence of acute aortic syndrome. 2. Infrarenal abdominal aortic aneurysm measuring 5.2 cm with calcified plaque. According to consensus criteria, vascular consultation is advised as well as follow-up imaging every 6 months. 3. Gallstones with a 5 mm cystic duct stone and additional calcifications along the nondilated common bile duct suspicious for choledocholithiasis. 4. 6.1 cm fat-containing umbilical hernia. 5. Dilated main pulmonary artery concerning for pulmonary artery hypertension. Electronically signed by: Waddell Calk MD 11/13/2023 07:08 AM EDT RP Workstation: [id number]        Scheduled Meds:  amLODipine   5 mg Oral Daily   carvedilol   6.25 mg Oral BID WC   enoxaparin (LOVENOX) injection  40 mg Subcutaneous Q24H   losartan   100 mg Oral Daily   And   hydrochlorothiazide   25 mg Oral Daily   rosuvastatin   5 mg Oral Q M,W,F   tamsulosin  0.4 mg Oral Daily   Continuous Infusions:  sodium chloride  20 mL/hr at 11/14/23 1337  sodium chloride  Stopped (11/14/23 0836)   piperacillin-tazobactam (ZOSYN)  IV Stopped (11/14/23 1246)     LOS: 1 day    Time spent: 40 minutes    Toribio Hummer, MD [facility]   To contact the attending provider between 7A-7P or the covering provider  during after hours 7P-7A, please log into the web site [email or web address] and access using universal [place] password for that web site. If you do not have the password, please call the hospital operator.  11/14/2023, 4:05 PM

## 2023-11-14 NOTE — H&P (View-Only) (Signed)
 UNASSIGNED PATIENT Subjective: Patient seems to be doing fairly well today.  He has had some mild abdominal pain but denies having nausea or vomiting.  He is awaiting an ERCP tomorrow.  Objective: Vital signs in last 24 hours: Temp:  [97.5 F (36.4 C)-98.9 F (37.2 C)] 98.8 F (37.1 C) (09/29 1413) Pulse Rate:  [65-82] 71 (09/29 1413) Resp:  [16-19] 18 (09/29 1413) BP: (104-143)/(53-76) 133/53 (09/29 1413) SpO2:  [96 %-99 %] 99 % (09/29 1413) Last BM Date : 11/14/23  Intake/Output from previous day: 09/28 0701 - 09/29 0700 In: 1219.1 [P.O.:360; I.V.:809.1; IV Piggyback:50] Out: 500 [Urine:500] Intake/Output this shift: Total I/O In: 921.7 [P.O.:860; I.V.:11.7; IV Piggyback:50] Out: 300 [Urine:300]  General appearance: alert, cooperative, and morbidly obese Resp: clear to auscultation bilaterally Cardio: regular rate and rhythm, S1, S2 normal, no murmur, click, rub or gallop GI: soft, morbidly obese. non-tender; bowel sounds normal; no masses,  no organomegaly  Lab Results: Recent Labs    11/13/23 0431 11/14/23 0423  WBC 14.3* 13.9*  HGB 13.9 13.6  HCT 42.7 40.3  PLT 170 153   BMET Recent Labs    11/13/23 0431 11/14/23 0423  NA 133* 135  K 3.8 3.7  CL 98 98  CO2 24 25  GLUCOSE 147* 131*  BUN 16 10  CREATININE 0.81 0.85  CALCIUM  9.5 9.3   LFT Recent Labs    11/14/23 0423  PROT 6.9  ALBUMIN 4.1  AST 20  ALT 14  ALKPHOS 69  BILITOT 3.9*  BILIDIR 0.6*  IBILI 3.3*   Studies/Results: MR ABDOMEN MRCP W WO CONTAST Result Date: 11/13/2023 CLINICAL DATA:  Cholelithiasis.  Abdominal pain. EXAM: MRI ABDOMEN WITHOUT AND WITH CONTRAST (INCLUDING MRCP) TECHNIQUE: Multiplanar multisequence MR imaging of the abdomen was performed both before and after the administration of intravenous contrast. Heavily T2-weighted images of the biliary and pancreatic ducts were obtained, and three-dimensional MRCP images were rendered by post processing. CONTRAST:  10mL GADAVIST  GADOBUTROL 1 MMOL/ML IV SOLN COMPARISON:  CT chest abdomen pelvis 11/13/2023 FINDINGS: Lower chest:  Lung bases are clear. Hepatobiliary: No intrahepatic biliary duct dilatation. There is small amount of pericholecystic fluid. There multiple small gallstones in lumen the gallbladder. The small stones measure approximately 3 mm each in too numerous to count. Gallbladder is distended to 5.0 cm. The common bile duct is not dilated however there is a filling defect within the most distal common bile duct just above the ampulla (image 23/2 on T2 weighted imaging). This distal filling defect is also seen on the heavily T2 weighted MRCP sequence image 9/series 10. This small presumed distal gallstone is similar size to the stones within the lumen the gallbladder. Pancreas: No pancreatic duct dilatation. No pancreatic inflammation. Spleen: Normal spleen. Adrenals/urinary tract: Adrenal glands and kidneys are normal. Stomach/Bowel: Stomach and limited of the small bowel is unremarkable Vascular/Lymphatic: Abdominal aortic normal caliber. No retroperitoneal periportal lymphadenopathy. Musculoskeletal: No aggressive osseous lesion IMPRESSION: 1. Pericholecystic fluid, multiple small gallstones and gallbladder distension are concerning for acute cholecystitis. Recommend clinical correlation. 2. Small filling defect in the most distal common bile duct consistent with choledocholithiasis. No extrahepatic biliary duct dilatation. 3. No evidence of pancreatitis. Electronically Signed   By: Jackquline Boxer M.D.   On: 11/13/2023 12:48   MR 3D Recon At Scanner Result Date: 11/13/2023 CLINICAL DATA:  Cholelithiasis.  Abdominal pain. EXAM: MRI ABDOMEN WITHOUT AND WITH CONTRAST (INCLUDING MRCP) TECHNIQUE: Multiplanar multisequence MR imaging of the abdomen was performed both before and after  the administration of intravenous contrast. Heavily T2-weighted images of the biliary and pancreatic ducts were obtained, and three-dimensional  MRCP images were rendered by post processing. CONTRAST:  10mL GADAVIST GADOBUTROL 1 MMOL/ML IV SOLN COMPARISON:  CT chest abdomen pelvis 11/13/2023 FINDINGS: Lower chest:  Lung bases are clear. Hepatobiliary: No intrahepatic biliary duct dilatation. There is small amount of pericholecystic fluid. There multiple small gallstones in lumen the gallbladder. The small stones measure approximately 3 mm each in too numerous to count. Gallbladder is distended to 5.0 cm. The common bile duct is not dilated however there is a filling defect within the most distal common bile duct just above the ampulla (image 23/2 on T2 weighted imaging). This distal filling defect is also seen on the heavily T2 weighted MRCP sequence image 9/series 10. This small presumed distal gallstone is similar size to the stones within the lumen the gallbladder. Pancreas: No pancreatic duct dilatation. No pancreatic inflammation. Spleen: Normal spleen. Adrenals/urinary tract: Adrenal glands and kidneys are normal. Stomach/Bowel: Stomach and limited of the small bowel is unremarkable Vascular/Lymphatic: Abdominal aortic normal caliber. No retroperitoneal periportal lymphadenopathy. Musculoskeletal: No aggressive osseous lesion IMPRESSION: 1. Pericholecystic fluid, multiple small gallstones and gallbladder distension are concerning for acute cholecystitis. Recommend clinical correlation. 2. Small filling defect in the most distal common bile duct consistent with choledocholithiasis. No extrahepatic biliary duct dilatation. 3. No evidence of pancreatitis. Electronically Signed   By: Jackquline Boxer M.D.   On: 11/13/2023 12:48   CT Angio Chest/Abd/Pel for Dissection W and/or W/WO Result Date: 11/13/2023 EXAM: CTA CHEST, ABDOMEN AND PELVIS WITHOUT AND WITH IV CONTRAST 11/13/2023 06:51:26 AM TECHNIQUE: CTA of the chest was performed without and with the administration of 100 mL of iohexol  (OMNIPAQUE ) 350 MG/ML injection. CTA of the abdomen and pelvis was  performed without and with the administration of 100 mL of iohexol  (OMNIPAQUE ) 350 MG/ML injection. Multiplanar reformatted images are provided for review. MIP images are provided for review. Automated exposure control, iterative reconstruction, and/or weight based adjustment of the mA/kV was utilized to reduce the radiation dose to as low as reasonably achievable. COMPARISON: None available. CLINICAL HISTORY: Acute aortic syndrome (AAS) suspected; acute generalized abdominal pain, hx/o afib. Scan due to suspected acute aortic syndrome. Hx/o afib. Per triage notes: Pt arrived from home via POV c/o RUQ abd pain 10/10 that began at 2000 11/13/2023 with multiple emesis episodes. Pt also noted to have new protrusion the size of a racket ball that pt states popped out after emesis episodes. FINDINGS: VASCULATURE: AORTA: Aortic atherosclerosis. 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 Stable in the interval. The infrarenal abdominal aorta measures 5.2 cm in maximum AP dimension, image 207/11. Calcified plaque is noted within the abdominal aorta. No dissection. PULMONARY ARTERIES: The main pulmonary artery measures 4.5 cm in diameter. No pulmonary embolism within the limits of this exam. GREAT VESSELS OF AORTIC ARCH: No acute finding. No dissection. No arterial occlusion or significant stenosis. CELIAC TRUNK: No acute finding. No occlusion or significant stenosis. SUPERIOR MESENTERIC ARTERY: No acute finding. No occlusion or significant stenosis. INFERIOR MESENTERIC ARTERY: No acute finding. No occlusion or significant stenosis. RENAL ARTERIES: No acute finding. No occlusion or significant stenosis. ILIAC ARTERIES: The left common iliac artery measures 2.1 cm, image 233/11. The right common iliac artery measures up to 2.6 cm. No occlusion or significant stenosis. CHEST: MEDIASTINUM: Cardiac enlargement. Status post CABG. No pericardial effusion. No mediastinal lymphadenopathy. LUNGS AND PLEURA:  Atelectasis versus scar within the inferior lingula. No focal  consolidation or pulmonary edema. No evidence of pleural effusion or pneumothorax. THORACIC BONES AND SOFT TISSUES: Status post median sternotomy. No acute osseous findings. No acute soft tissue abnormality. ABDOMEN AND PELVIS: LIVER: The liver is unremarkable. GALLBLADDER AND BILE DUCTS: Gallstones. Stone within the cystic duct is identified measuring 5 mm. There are several calcifications identified along the course of the nondilated common bile duct within the head of pancreas, image 98/13, suspicious for choledocholithiasis. SPLEEN: The spleen is unremarkable. PANCREAS: The pancreas is unremarkable. ADRENAL GLANDS: Bilateral adrenal glands demonstrate no acute abnormality. KIDNEYS, URETERS AND BLADDER: Simple cyst arising off the medial cortex of right kidney measures 1.7 cm, image 162/11. No stones in the kidneys or ureters. No hydronephrosis. No perinephric or periureteral stranding. Urinary bladder is unremarkable. GI AND BOWEL: Stomach and duodenal sweep demonstrate no acute abnormality. There is no bowel obstruction. No abnormal bowel wall thickening or distension. REPRODUCTIVE: Prostate gland enlargement with multiple brachytherapy seeds noted. PERITONEUM AND RETROPERITONEUM: No ascites or free air. No free fluid or fluid collections. LYMPH NODES: No lymphadenopathy. ABDOMINAL BONES AND SOFT TISSUES: 6.1 cm fat containing umbilical hernia. No acute abnormality of the bones. No acute soft tissue abnormality. IMPRESSION: 1. No evidence of acute aortic syndrome. 2. Infrarenal abdominal aortic aneurysm measuring 5.2 cm with calcified plaque. According to consensus criteria, vascular consultation is advised as well as follow-up imaging every 6 months. 3. Gallstones with a 5 mm cystic duct stone and additional calcifications along the nondilated common bile duct suspicious for choledocholithiasis. 4. 6.1 cm fat-containing umbilical hernia. 5. Dilated  main pulmonary artery concerning for pulmonary artery hypertension. Electronically signed by: Waddell Calk MD 11/13/2023 07:08 AM EDT RP Workstation: [id number]    Medications: I have reviewed the patient's current medications. Prior to Admission:  Medications Prior to Admission  Medication Sig Dispense Refill Last Dose/Taking   amLODipine  (NORVASC ) 5 MG tablet TAKE 1 TABLET BY MOUTH DAILY. 90 tablet 2 11/12/2023   apixaban  (ELIQUIS ) 5 MG TABS tablet Take 1 tablet (5 mg total) by mouth 2 (two) times daily. 56 tablet 0 11/12/2023 at  4:30 AM   carvedilol  (COREG ) 6.25 MG tablet TAKE 1 TABLET BY MOUTH 2 TIMES DAILY WITH A MEAL. 180 tablet 2 11/12/2023   losartan -hydrochlorothiazide  (HYZAAR) 100-25 MG tablet TAKE 1 TABLET BY MOUTH EVERY DAY 90 tablet 2 11/12/2023   rosuvastatin  (CRESTOR ) 5 MG tablet TAKE 1 TABLET BY MOUTH THREE TIMES A WEEK (Patient taking differently: Take 5 mg by mouth See admin instructions. TAKE 1 TABLET BY MOUTH THREE TIMES A WEEK) 36 tablet 3 11/12/2023   tamsulosin (FLOMAX) 0.4 MG CAPS capsule Take 0.4 mg by mouth daily.   11/12/2023   Scheduled:  amLODipine   5 mg Oral Daily   carvedilol   6.25 mg Oral BID WC   enoxaparin (LOVENOX) injection  40 mg Subcutaneous Q24H   losartan   100 mg Oral Daily   And   hydrochlorothiazide   25 mg Oral Daily   rosuvastatin   5 mg Oral Q M,W,F   tamsulosin  0.4 mg Oral Daily   Continuous:  sodium chloride  20 mL/hr at 11/14/23 1337   sodium chloride  Stopped (11/14/23 0836)   piperacillin-tazobactam (ZOSYN)  IV 3.375 g (11/14/23 1628)   PRN:acetaminophen  **OR** acetaminophen , albuterol, morphine injection, ondansetron  **OR** ondansetron  (ZOFRAN ) IV, oxyCODONE , traZODone  Assessment/Plan: 1) Acute cholecystitis, cholelithiasis with choledocholithiasis for ERCP tomorrow. 2) Atrial fibrillation-Eliquis  is on hold. 3) Hyperlipidemia. 4) CAD status post CABG. 5) Benign prostatic hypertrophy. 6) AAA.  LOS: 1 day  Renaye Sous 11/14/2023,  5:56 PM

## 2023-11-14 NOTE — Consult Note (Signed)
    Patient with 5.2 cm aneurysm does not require repair at this time and cleared for procedures as necessary.  I will have him follow-up in our office with duplex in 6 months.  Flint Hakeem C. Sheree, MD [facility] Office: [phone] Pager: [phone]

## 2023-11-15 ENCOUNTER — Encounter (HOSPITAL_COMMUNITY)
Admission: EM | Disposition: A | Discharge status: Home / Self Care | Payer: Self-pay | Source: Home / Self Care | Attending: Internal Medicine

## 2023-11-15 ENCOUNTER — Inpatient Hospital Stay (HOSPITAL_COMMUNITY): Admitting: Certified Registered"

## 2023-11-15 ENCOUNTER — Inpatient Hospital Stay (HOSPITAL_COMMUNITY)

## 2023-11-15 DIAGNOSIS — I25119 Atherosclerotic heart disease of native coronary artery with unspecified angina pectoris: Secondary | ICD-10-CM

## 2023-11-15 DIAGNOSIS — K8042 Calculus of bile duct with acute cholecystitis without obstruction: Secondary | ICD-10-CM | POA: Diagnosis not present

## 2023-11-15 DIAGNOSIS — I25709 Atherosclerosis of coronary artery bypass graft(s), unspecified, with unspecified angina pectoris: Secondary | ICD-10-CM | POA: Diagnosis not present

## 2023-11-15 DIAGNOSIS — K8050 Calculus of bile duct without cholangitis or cholecystitis without obstruction: Secondary | ICD-10-CM

## 2023-11-15 DIAGNOSIS — I7143 Infrarenal abdominal aortic aneurysm, without rupture: Secondary | ICD-10-CM | POA: Diagnosis not present

## 2023-11-15 DIAGNOSIS — I4819 Other persistent atrial fibrillation: Secondary | ICD-10-CM | POA: Diagnosis not present

## 2023-11-15 LAB — COMPREHENSIVE METABOLIC PANEL WITH GFR
ALT: 12 U/L (ref 0–44)
AST: 18 U/L (ref 15–41)
Albumin: 3.8 g/dL (ref 3.5–5.0)
Alkaline Phosphatase: 68 U/L (ref 38–126)
Anion gap: 12 (ref 5–15)
BUN: 14 mg/dL (ref 8–23)
CO2: 26 mmol/L (ref 22–32)
Calcium: 9.2 mg/dL (ref 8.9–10.3)
Chloride: 99 mmol/L (ref 98–111)
Creatinine, Ser: 1 mg/dL (ref 0.61–1.24)
GFR, Estimated: >60 mL/min
Glucose, Bld: 99 mg/dL (ref 70–99)
Potassium: 3.4 mmol/L — ABNORMAL LOW (ref 3.5–5.1)
Sodium: 137 mmol/L (ref 135–145)
Total Bilirubin: 4.5 mg/dL — ABNORMAL HIGH (ref 0.0–1.2)
Total Protein: 6.5 g/dL (ref 6.5–8.1)

## 2023-11-15 LAB — CBC WITH DIFFERENTIAL/PLATELET
Abs Immature Granulocytes: 0.05 K/uL (ref 0.00–0.07)
Basophils Absolute: 0.1 K/uL (ref 0.0–0.1)
Basophils Relative: 1 %
Eosinophils Absolute: 0.1 K/uL (ref 0.0–0.5)
Eosinophils Relative: 1 %
HCT: 39.6 % (ref 39.0–52.0)
Hemoglobin: 13.1 g/dL (ref 13.0–17.0)
Immature Granulocytes: 0 %
Lymphocytes Relative: 10 %
Lymphs Abs: 1.3 K/uL (ref 0.7–4.0)
MCH: 29 pg (ref 26.0–34.0)
MCHC: 33.1 g/dL (ref 30.0–36.0)
MCV: 87.6 fL (ref 80.0–100.0)
Monocytes Absolute: 0.9 K/uL (ref 0.1–1.0)
Monocytes Relative: 7 %
Neutro Abs: 10.4 K/uL — ABNORMAL HIGH (ref 1.7–7.7)
Neutrophils Relative %: 81 %
Platelets: 164 K/uL (ref 150–400)
RBC: 4.52 MIL/uL (ref 4.22–5.81)
RDW: 13.2 % (ref 11.5–15.5)
WBC: 12.8 K/uL — ABNORMAL HIGH (ref 4.0–10.5)
nRBC: 0 % (ref 0.0–0.2)

## 2023-11-15 LAB — PHOSPHORUS: Phosphorus: 2.6 mg/dL (ref 2.5–4.6)

## 2023-11-15 LAB — LIPID PANEL
Cholesterol: 82 mg/dL (ref 0–200)
HDL: 27 mg/dL — ABNORMAL LOW (ref >40)
LDL Cholesterol: 36 mg/dL (ref 0–99)
Total CHOL/HDL Ratio: 3.1 ratio
Triglycerides: 95 mg/dL (ref <150)
VLDL: 19 mg/dL (ref 0–40)

## 2023-11-15 LAB — MAGNESIUM: Magnesium: 2.5 mg/dL — ABNORMAL HIGH (ref 1.7–2.4)

## 2023-11-15 SURGERY — ERCP, WITH INTERVENTION IF INDICATED
Anesthesia: General

## 2023-11-15 MED ORDER — MIDAZOLAM HCL 2 MG/2ML IJ SOLN
INTRAMUSCULAR | Status: DC | PRN
  Administered 2023-11-15: 2 mg via INTRAVENOUS

## 2023-11-15 MED ORDER — PROPOFOL 10 MG/ML IV BOLUS
INTRAVENOUS | Status: AC
  Filled 2023-11-15: qty 20

## 2023-11-15 MED ORDER — SODIUM CHLORIDE 0.9 % IV SOLN: INTRAVENOUS | Status: AC

## 2023-11-15 MED ORDER — FENTANYL CITRATE (PF) 100 MCG/2ML IJ SOLN
INTRAMUSCULAR | Status: AC
  Filled 2023-11-15: qty 2

## 2023-11-15 MED ORDER — SUGAMMADEX SODIUM 200 MG/2ML IV SOLN
INTRAVENOUS | Status: DC | PRN
  Administered 2023-11-15: 400 mg via INTRAVENOUS

## 2023-11-15 MED ORDER — GLUCAGON HCL RDNA (DIAGNOSTIC) 1 MG IJ SOLR
INTRAMUSCULAR | Status: DC | PRN
  Administered 2023-11-15: .5 mg via INTRAVENOUS

## 2023-11-15 MED ORDER — DEXMEDETOMIDINE HCL IN NACL 80 MCG/20ML IV SOLN
INTRAVENOUS | Status: AC
  Filled 2023-11-15: qty 20

## 2023-11-15 MED ORDER — DEXMEDETOMIDINE HCL IN NACL 80 MCG/20ML IV SOLN
INTRAVENOUS | Status: DC | PRN
Start: 2023-11-15 — End: 2023-11-15
  Administered 2023-11-15: 80 ug via INTRAVENOUS

## 2023-11-15 MED ORDER — PROPOFOL 10 MG/ML IV BOLUS
INTRAVENOUS | Status: DC | PRN
  Administered 2023-11-15: 150 mg via INTRAVENOUS

## 2023-11-15 MED ORDER — PHENYLEPHRINE HCL-NACL 20-0.9 MG/250ML-% IV SOLN
INTRAVENOUS | Status: DC | PRN
  Administered 2023-11-15: 50 ug/min via INTRAVENOUS

## 2023-11-15 MED ORDER — ROCURONIUM 10MG/ML (10ML) SYRINGE FOR MEDFUSION PUMP - OPTIME
INTRAVENOUS | Status: DC | PRN
  Administered 2023-11-15 (×2): 50 mg via INTRAVENOUS

## 2023-11-15 MED ORDER — MIDAZOLAM HCL 2 MG/2ML IJ SOLN
INTRAMUSCULAR | Status: AC
  Filled 2023-11-15: qty 2

## 2023-11-15 MED ORDER — EPHEDRINE SULFATE (PRESSORS) 50 MG/ML IJ SOLN
INTRAMUSCULAR | Status: DC | PRN
  Administered 2023-11-15 (×4): 10 mg via INTRAVENOUS
  Administered 2023-11-15: 5 mg via INTRAVENOUS

## 2023-11-15 MED ORDER — FENTANYL CITRATE (PF) 250 MCG/5ML IJ SOLN
INTRAMUSCULAR | Status: DC | PRN
  Administered 2023-11-15 (×2): 50 ug via INTRAVENOUS

## 2023-11-15 MED ORDER — LIDOCAINE 2% (20 MG/ML) 5 ML SYRINGE
INTRAMUSCULAR | Status: DC | PRN
  Administered 2023-11-15: 100 mg via INTRAVENOUS

## 2023-11-15 MED ORDER — DICLOFENAC SUPPOSITORY 100 MG
RECTAL | Status: DC | PRN
Start: 2023-11-15 — End: 2023-11-15
  Administered 2023-11-15: 100 mg via RECTAL

## 2023-11-15 MED ORDER — ONDANSETRON HCL 4 MG/2ML IJ SOLN
INTRAMUSCULAR | Status: DC | PRN
Start: 2023-11-15 — End: 2023-11-15
  Administered 2023-11-15: 4 mg via INTRAVENOUS

## 2023-11-15 MED ORDER — SODIUM CHLORIDE 0.9 % IV SOLN
INTRAVENOUS | Status: DC | PRN
  Administered 2023-11-15: 20 mL

## 2023-11-15 MED ORDER — PHENYLEPHRINE HCL (PRESSORS) 10 MG/ML IV SOLN
INTRAVENOUS | Status: DC | PRN
  Administered 2023-11-15 (×2): 200 ug via INTRAVENOUS
  Administered 2023-11-15 (×4): 160 ug via INTRAVENOUS

## 2023-11-15 MED ORDER — DEXAMETHASONE SODIUM PHOSPHATE 10 MG/ML IJ SOLN
INTRAMUSCULAR | Status: DC | PRN
  Administered 2023-11-15: 10 mg via INTRAVENOUS

## 2023-11-15 NOTE — Progress Notes (Signed)
 PROGRESS NOTE    Jacob Small  FMW:[id number] DOB: 20-Apr-1955 DOA: 11/13/2023 PCP: Auston Opal, DO    Chief Complaint  Patient presents with   Abdominal Pain   Emesis    Brief Narrative:  Patient 68 year old gentleman history of CAD, hypertension, hyperlipidemia, A-fib on Eliquis  presenting to the ED with 1 day of right upper quadrant abdominal pain, nausea and emesis with imaging concerning for choledocholithiasis.  MRCP obtained concerning for choledocholithiasis and acute cholecystitis possibly.  GI general surgery consulted and following.  Patient placed empirically on IV antibiotics.  CT angiogram chest abdomen and pelvis also with concerns for 5.2 cm infrarenal AAA.  Vascular surgery curb sided for AAA who are recommending outpatient follow-up with repeat imaging in 6 months.  Patient seen by GI and general surgery and patient for ERCP, 11/15/2023 and probable laparoscopic cholecystectomy 11/16/2023.    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 - CT angiogram chest abdomen and negative for acute aortic syndrome, infrarenal AAA measuring 5.2 cm with calcified plaque, gallstones with a 5 mm cystic duct stone and additional calcifications along the nondilated common bile duct suspicious for choledocholithiasis.  6.1 cm fat-containing umbilical hernia.  Dilated main pulmonary artery concerning for pulmonary artery hypertension. -  MRCP obtained with pericholecystic fluid, multiple small gallstones and gallbladder distention concerning for acute cholecystitis.  Small filling defect in the most distal common bile duct consistent with choledocholithiasis.  No extrahepatic biliary duct dilatation.  No evidence of pancreatitis. -Patient with right upper quadrant abdominal pain, leukocytosis which is slowly trending down. -LFTs pending. - Continue empiric IV Zosyn. - Patient being followed by GI and patient for ERCP today.  - General Surgery consulted and planning for laparoscopic cholecystectomy on 11/16/2023 post ERCP.  - Continue IV antibiotics, gentle hydration, pain management, supportive care.  - General Surgery and GI following and appreciate input and recommendations.  2.  Permanent A-fib - Coreg  for rate control.   - Eliquis  on hold pending procedures.  - GI and general surgery to advise when anticoagulation may be resumed.  3.  Hypertension - Continue Coreg , Norvasc , Cozaar , HCTZ.    4.  AAA -Patient with infrarenal AAA measuring 5.2 cm with calcified plaque noted on CT angiogram chest abdomen and pelvis. - Curb sided vascular surgery, Dr. Sheree who reviewed the chart and felt aneurysm does not require repair at this time, patient cleared for any necessary procedures and outpatient follow-up with duplex in 6 months with vascular surgery, Dr Sheree. - Patient insistent on following up with his cardiologist first in terms of his AAA.  - Continue current regimen of beta-blocker, ARB, statin.  5.  Hyperlipidemia - Fasting lipid panel with LDL of 36, HDL of 27, total cholesterol of 82, triglycerides of 95.  - Continue home regimen Crestor .  6.  BPH - Flomax.  7.  CAD status post CABG - Continue home regimen of Norvasc , Coreg , Cozaar , HCTZ, Crestor . - Outpatient follow-up with primary cardiologist.    DVT prophylaxis: Lovenox  Code Status: Full Family Communication: Updated patient and wife at  bedside. Disposition: Likely home when clinically improved and cleared by general surgery and GI.  Status is: Inpatient Remains inpatient appropriate because: Severity of illness.   Consultants:  Gastroenterology: Dr. Dianna 11/13/2023 General Surgery: Dr. Vanderbilt 11/13/2023 Curb sided vascular surgery: Dr. Sheree  Procedures:  CT angiogram chest abdomen and pelvis 11/13/2023 MRCP 11/13/2023   Antimicrobials:  Anti-infectives (From admission, onward)    Start     Dose/Rate Route Frequency Ordered Stop   11/14/23 0830  piperacillin-tazobactam (ZOSYN) IVPB 3.375 g        3.375 g 12.5 mL/hr over 240 Minutes Intravenous Every 8 hours 11/14/23 0812     11/13/23 0730  piperacillin-tazobactam (ZOSYN) IVPB 3.375 g        3.375 g 100 mL/hr over 30 Minutes Intravenous  Once 11/13/23 0718 11/13/23 0800         Subjective: Sitting up in chair.  Denies any chest pain or shortness of breath.  Still with right upper quadrant abdominal pain with deep palpation per patient.  No nausea or vomiting.  Stated he had approximately 4 watery stools overnight.  None this morning.  Wife at bedside.  Objective: Vitals:   11/14/23 0915 11/14/23 1413 11/14/23 2159 11/15/23 0620  BP: 104/65 (!) 133/53 (!) 111/58 (!) 107/53  Pulse: 72 71 72 98  Resp: 16 18 19 17   Temp: 98.6 F (37 C) 98.8 F (37.1 C) 98.6 F (37 C) 98.6 F (37 C)  TempSrc: Oral Oral Oral Oral  SpO2: 96% 99% 96% 97%  Weight:      Height:        Intake/Output Summary (Last 24 hours) at 11/15/2023 0932 Last data filed at 11/15/2023 0600 Gross per 24 hour  Intake 1757.89 ml  Output 300 ml  Net 1457.89 ml   Filed Weights   11/13/23 0200  Weight: (!) 146 kg    Examination:  General exam: NAD Respiratory system: Lungs clear to auscultation bilaterally.  No wheezes, no crackles, no rhonchi.  Fair air movement.  Speaking in full sentences.  Cardiovascular system: Irregularly irregular. No JVD.  Trace to 1+ bilateral lower  extremity edema.  Gastrointestinal system: Abdomen is obese, mildly distended, soft, tender to palpation right upper quadrant.  Positive bowel sounds.  No rebound.  No guarding.  Reducible umbilical hernia.  Central nervous system: Alert and oriented. No focal neurological deficits. Extremities: Symmetric 5 x 5 power. Skin: No rashes, lesions or ulcers Psychiatry: Judgement and insight appear normal. Mood & affect appropriate.     Data Reviewed: I have personally reviewed following labs and imaging studies  CBC: Recent Labs  Lab 11/13/23 0431 11/14/23 0423 11/15/23 0739  WBC 14.3* 13.9* 12.8*  NEUTROABS  --   --  10.4*  HGB 13.9 13.6 13.1  HCT 42.7 40.3 39.6  MCV 85.9 87.4 87.6  PLT 170 153 164    Basic Metabolic Panel: Recent Labs  Lab 11/13/23 0431 11/14/23 0423 11/15/23 0516  NA 133* 135  --   K 3.8 3.7  --   CL 98 98  --   CO2 24 25  --   GLUCOSE 147* 131*  --   BUN 16 10  --   CREATININE 0.81 0.85  --   CALCIUM  9.5 9.3  --   MG  --   --  2.5*  PHOS  --   --  2.6    GFR: Estimated Creatinine Clearance: 126.7 mL/min (by  C-G formula based on SCr of 0.85 mg/dL).  Liver Function Tests: Recent Labs  Lab 11/13/23 0431 11/14/23 0423  AST 23 20  ALT 14 14  ALKPHOS 77 69  BILITOT 1.5* 3.9*  PROT 7.1 6.9  ALBUMIN 4.3 4.1    CBG: No results for input(s): GLUCAP in the last 168 hours.   No results found for this or any previous visit (from the past 240 hours).       Radiology Studies: MR ABDOMEN MRCP W WO CONTAST Result Date: 11/13/2023 CLINICAL DATA:  Cholelithiasis.  Abdominal pain. EXAM: MRI ABDOMEN WITHOUT AND WITH CONTRAST (INCLUDING MRCP) TECHNIQUE: Multiplanar multisequence MR imaging of the abdomen was performed both before and after the administration of intravenous contrast. Heavily T2-weighted images of the biliary and pancreatic ducts were obtained, and three-dimensional MRCP images were rendered by post processing. CONTRAST:  10mL GADAVIST  GADOBUTROL 1 MMOL/ML IV SOLN COMPARISON:  CT chest abdomen pelvis 11/13/2023 FINDINGS: Lower chest:  Lung bases are clear. Hepatobiliary: No intrahepatic biliary duct dilatation. There is small amount of pericholecystic fluid. There multiple small gallstones in lumen the gallbladder. The small stones measure approximately 3 mm each in too numerous to count. Gallbladder is distended to 5.0 cm. The common bile duct is not dilated however there is a filling defect within the most distal common bile duct just above the ampulla (image 23/2 on T2 weighted imaging). This distal filling defect is also seen on the heavily T2 weighted MRCP sequence image 9/series 10. This small presumed distal gallstone is similar size to the stones within the lumen the gallbladder. Pancreas: No pancreatic duct dilatation. No pancreatic inflammation. Spleen: Normal spleen. Adrenals/urinary tract: Adrenal glands and kidneys are normal. Stomach/Bowel: Stomach and limited of the small bowel is unremarkable Vascular/Lymphatic: Abdominal aortic normal caliber. No retroperitoneal periportal lymphadenopathy. Musculoskeletal: No aggressive osseous lesion IMPRESSION: 1. Pericholecystic fluid, multiple small gallstones and gallbladder distension are concerning for acute cholecystitis. Recommend clinical correlation. 2. Small filling defect in the most distal common bile duct consistent with choledocholithiasis. No extrahepatic biliary duct dilatation. 3. No evidence of pancreatitis. Electronically Signed   By: Jackquline Boxer M.D.   On: 11/13/2023 12:48   MR 3D Recon At Scanner Result Date: 11/13/2023 CLINICAL DATA:  Cholelithiasis.  Abdominal pain. EXAM: MRI ABDOMEN WITHOUT AND WITH CONTRAST (INCLUDING MRCP) TECHNIQUE: Multiplanar multisequence MR imaging of the abdomen was performed both before and after the administration of intravenous contrast. Heavily T2-weighted images of the biliary and pancreatic ducts were obtained, and three-dimensional  MRCP images were rendered by post processing. CONTRAST:  10mL GADAVIST GADOBUTROL 1 MMOL/ML IV SOLN COMPARISON:  CT chest abdomen pelvis 11/13/2023 FINDINGS: Lower chest:  Lung bases are clear. Hepatobiliary: No intrahepatic biliary duct dilatation. There is small amount of pericholecystic fluid. There multiple small gallstones in lumen the gallbladder. The small stones measure approximately 3 mm each in too numerous to count. Gallbladder is distended to 5.0 cm. The common bile duct is not dilated however there is a filling defect within the most distal common bile duct just above the ampulla (image 23/2 on T2 weighted imaging). This distal filling defect is also seen on the heavily T2 weighted MRCP sequence image 9/series 10. This small presumed distal gallstone is similar size to the stones within the lumen the gallbladder. Pancreas: No pancreatic duct dilatation. No pancreatic inflammation. Spleen: Normal spleen. Adrenals/urinary tract: Adrenal glands and kidneys are normal. Stomach/Bowel: Stomach and limited of the small bowel is unremarkable Vascular/Lymphatic: Abdominal aortic normal caliber.  No retroperitoneal periportal lymphadenopathy. Musculoskeletal: No aggressive osseous lesion IMPRESSION: 1. Pericholecystic fluid, multiple small gallstones and gallbladder distension are concerning for acute cholecystitis. Recommend clinical correlation. 2. Small filling defect in the most distal common bile duct consistent with choledocholithiasis. No extrahepatic biliary duct dilatation. 3. No evidence of pancreatitis. Electronically Signed   By: Jackquline Boxer M.D.   On: 11/13/2023 12:48        Scheduled Meds:  amLODipine   5 mg Oral Daily   carvedilol   6.25 mg Oral BID WC   enoxaparin (LOVENOX) injection  40 mg Subcutaneous Q24H   losartan   100 mg Oral Daily   And   hydrochlorothiazide   25 mg Oral Daily   rosuvastatin   5 mg Oral Q M,W,F   tamsulosin  0.4 mg Oral Daily   Continuous Infusions:   sodium chloride  20 mL/hr at 11/14/23 1337   sodium chloride      piperacillin-tazobactam (ZOSYN)  IV 3.375 g (11/15/23 0800)     LOS: 2 days    Time spent: 40 minutes    Toribio Hummer, MD [facility]   To contact the attending provider between 7A-7P or the covering provider during after hours 7P-7A, please log into the web site [email or web address] and access using universal [place] password for that web site. If you do not have the password, please call the hospital operator.  11/15/2023, 9:32 AM

## 2023-11-15 NOTE — Op Note (Signed)
 [facility] Patient Name: Jacob Small Procedure Date: 11/15/2023 MRN: [id number] Attending MD: Belvie Just , MD, [id number] Date of Birth: Jun 18, 1955 CSN: [id number] Age: 68 Admit Type: Inpatient Procedure:                ERCP Indications:              Common bile duct stone(s) Providers:                Belvie Just, MD, Ritta Debbie Alert, RN,                            Farris Southgate, Technician Referring MD:              Medicines:                General Anesthesia Complications:            No immediate complications. Estimated Blood Loss:     Estimated blood loss: none. Procedure:                Pre-Anesthesia Assessment:                           - Prior to the procedure, a History and Physical                            was performed, and patient medications and                            allergies were reviewed. The patient's tolerance of                            previous anesthesia was also reviewed. The risks                            and benefits of the procedure and the sedation                            options and risks were discussed with the patient.                            All questions were answered, and informed consent                            was obtained. Prior Anticoagulants: The patient has                            taken Lovenox (enoxaparin), last dose was 1 day                            prior to procedure. ASA Grade Assessment: III - A                            patient with severe systemic disease. After  reviewing the risks and benefits, the patient was                            deemed in satisfactory condition to undergo the                            procedure.                           - Sedation was administered by an anesthesia                            professional. General anesthesia was attained.                           After obtaining informed consent, the scope was                             passed under direct vision. Throughout the                            procedure, the patient's blood pressure, pulse, and                            oxygen saturations were monitored continuously. The                            [id number]-[id number] ([id number]) Olympus duodenoscope was                            introduced through the mouth, and used to inject                            contrast into and used to inject contrast into the                            bile, dorsal and ventral pancreatic ducts. The ERCP                            was technically difficult and complex. The patient                            tolerated the procedure well. Scope In: Scope Out: Findings:      The major papilla was normal. The bile duct was deeply cannulated with       the short-nosed traction sphincterotome. Contrast was injected. I       personally interpreted the bile duct images. There was brisk flow of       contrast through the ducts. Image quality was excellent. Contrast       extended to the entire biliary tree. The common bile duct contained       filling defect(s) thought to be a stone. A short 0.025 inch Raymon was       passed into the biliary tree. A 13 mm biliary sphincterotomy was made  with a traction (standard) sphincterotome using ERBE electrocautery.       There was no post-sphincterotomy bleeding. The biliary tree was swept       with a 12 mm balloon starting at the bifurcation. One stone was removed.       No stones remained.      Cannulation was very difficult. In the typical position for CBD       cannulation the PD was cannulated multiple times. A two wire techinque       was used twice without any benefit. Cannulation was then directed       towards the typical PD location and the CBD was then easily cannulated.       Contrast injection showed a mild tapering in the distal CBD, but no       evidence of stricture. The proximal portion of the CBD was dilated       around 1 cm.  A small filling defect was found in the distal CBD. A 13 mm       sphincterotomy was created A copious amount of bile was draining from       the CBD. The duct was swept with the 9-12 mm balloon. The 12 mm balloon       was able to be pulled through the ampulla without any difficulty. During       one of the passes a small soft black stone was removed. The occlusion       cholangiogram was negative for any stone retention and the contrast dye       drained rapidly. The procedure was concluded. Impression:               - The major papilla appeared normal.                           - A filling defect consistent with a stone was seen                            on the cholangiogram.                           - Choledocholithiasis was found. Complete removal                            was accomplished by biliary sphincterotomy and                            balloon extraction.                           - A biliary sphincterotomy was performed.                           - The biliary tree was swept. Moderate Sedation:      Not Applicable - Patient had care per Anesthesia. Recommendation:           - Return patient to hospital ward for ongoing care.                           - Resume regular diet.                           -  Lap chole per Surgery. Procedure Code(s):        --- Professional ---                           [phone], Endoscopic retrograde                            cholangiopancreatography (ERCP); with removal of                            calculi/debris from biliary/pancreatic duct(s)                           43262, Endoscopic retrograde                            cholangiopancreatography (ERCP); with                            sphincterotomy/papillotomy                           [phone], Endoscopic catheterization of the biliary                            ductal system, radiological supervision and                            interpretation Diagnosis Code(s):        --- Professional  ---                           K80.50, Calculus of bile duct without cholangitis                            or cholecystitis without obstruction                           R93.2, Abnormal findings on diagnostic imaging of                            liver and biliary tract CPT copyright 2022 [employer]. All rights reserved. The codes documented in this report are preliminary and upon coder review may  be revised to meet current compliance requirements. Belvie Just, MD Belvie Just, MD 11/15/2023 3:52:57 PM This report has been signed electronically. Number of Addenda: 0

## 2023-11-15 NOTE — Anesthesia Preprocedure Evaluation (Signed)
 Anesthesia Evaluation  Patient identified by MRN, date of birth, ID band Patient awake    Reviewed: Allergy & Precautions, NPO status , Patient's Chart, lab work & pertinent test results, reviewed documented beta blocker date and time   History of Anesthesia Complications (+) Emergence Delirium and history of anesthetic complications  Airway Mallampati: III  TM Distance: >3 FB Neck ROM: Full    Dental  (+) Missing, Dental Advisory Given,    Pulmonary former smoker   Pulmonary exam normal breath sounds clear to auscultation       Cardiovascular hypertension, Pt. on home beta blockers and Pt. 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 Left ventricular ejection fraction, by estimation, is 60 to 65%. The  left ventricle has normal function. The left ventricle has no regional  wall motion abnormalities. The left ventricular internal cavity size was  mildly dilated. There is mild  concentric left ventricular hypertrophy. Left ventricular diastolic  parameters are consistent with Grade I diastolic dysfunction (impaired  relaxation).   2. Right ventricular systolic function is normal. The right ventricular  size is normal.   3. Left atrial size was mildly dilated.   4. The mitral valve is normal in structure. Trivial mitral valve  regurgitation. No evidence of mitral stenosis.   5. The aortic valve was not well visualized. Aortic valve regurgitation  is not visualized. Aortic valve sclerosis/calcification is present,  without any evidence of aortic stenosis. Aortic valve mean gradient  measures 10.2 mmHg. Aortic valve Vmax  measures 2.21 m/s.   6. Aortic root/ascending aorta has  been repaired/replaced.   7. The inferior vena cava is normal in size with greater than 50%  respiratory variability, suggesting right atrial pressure of 3 mmHg.     Neuro/Psych Bilateral hearing loss  negative psych ROS   GI/Hepatic Hyperbilirubinemia Normal LFT's Cholelithiasis Hx/o Choledocholithiasis S/P ERCP yesterday   Endo/Other    Class 3 obesity (BMI 41)HLD  Renal/GU negative Renal ROS  negative genitourinary   Musculoskeletal negative musculoskeletal ROS (+)    Abdominal  (+) + obese  Peds  Hematology  (+) Blood dyscrasia (eliquis ) Eliquis  therapy- last dose 9/27   Anesthesia Other Findings   Reproductive/Obstetrics                              Anesthesia Physical Anesthesia Plan  ASA: 3  Anesthesia Plan: General   Post-op Pain Management: Minimal or no pain anticipated   Induction: Intravenous and Cricoid pressure planned  PONV Risk Score and Plan: 4 or greater and Dexamethasone , Ondansetron , Treatment may vary due to age or medical condition, TIVA and Propofol  infusion  Airway Management Planned: Oral ETT  Additional Equipment: None  Intra-op Plan:   Post-operative Plan: Extubation in OR  Informed Consent: I have reviewed the patients History and Physical, chart, labs and discussed the procedure including the risks, benefits and alternatives for the proposed anesthesia with the patient or authorized representative who has indicated his/her understanding and acceptance.     Dental advisory given  Plan Discussed with: CRNA and Anesthesiologist  Anesthesia Plan Comments: (No Midazolam)         Anesthesia Quick Evaluation

## 2023-11-15 NOTE — Progress Notes (Signed)
 Progress Note     Subjective: Pt reports he is having some mild RUQ pain still. Had some diarrhea overnight as well. Plan for ERCP today and OR for lap chole tomorrow provided no post-ERCP complications.   Objective: Vital signs in last 24 hours: Temp:  [98.6 F (37 C)-98.8 F (37.1 C)] 98.6 F (37 C) (09/30 0620) Pulse Rate:  [71-98] 98 (09/30 0620) Resp:  [17-19] 17 (09/30 0620) BP: (107-133)/(53-58) 107/53 (09/30 0620) SpO2:  [96 %-99 %] 97 % (09/30 0620) Last BM Date : 11/15/23  Intake/Output from previous day: 09/29 0701 - 09/30 0700 In: 1757.9 [P.O.:1460; I.V.:147.9; IV Piggyback:150] Out: 300 [Urine:300] Intake/Output this shift: No intake/output data recorded.  PE: General: pleasant, WD, obese male, NAD HEENT: sclera anicteric  Heart: regular, rate, and rhythm.   Lungs: Respiratory effort nonlabored Abd: soft, TTP in RUQ, protuberant abdomen, reducible umbilical hernia  Psych: A&Ox3 with an appropriate affect.    Lab Results:  Recent Labs    11/14/23 0423 11/15/23 0739  WBC 13.9* 12.8*  HGB 13.6 13.1  HCT 40.3 39.6  PLT 153 164   BMET Recent Labs    11/13/23 0431 11/14/23 0423  NA 133* 135  K 3.8 3.7  CL 98 98  CO2 24 25  GLUCOSE 147* 131*  BUN 16 10  CREATININE 0.81 0.85  CALCIUM  9.5 9.3   PT/INR No results for input(s): LABPROT, INR in the last 72 hours. CMP     Component Value Date/Time   NA 135 11/14/2023 0423   NA 139 09/20/2023 0858   K 3.7 11/14/2023 0423   CL 98 11/14/2023 0423   CO2 25 11/14/2023 0423   GLUCOSE 131 (H) 11/14/2023 0423   BUN 10 11/14/2023 0423   BUN 16 09/20/2023 0858   CREATININE 0.85 11/14/2023 0423   CALCIUM  9.3 11/14/2023 0423   PROT 6.9 11/14/2023 0423   PROT 6.4 06/17/2023 0843   ALBUMIN 4.1 11/14/2023 0423   ALBUMIN 4.2 06/17/2023 0843   AST 20 11/14/2023 0423   ALT 14 11/14/2023 0423   ALKPHOS 69 11/14/2023 0423   BILITOT 3.9 (H) 11/14/2023 0423   BILITOT 1.8 (H) 06/17/2023 0843    GFRNONAA >60 11/14/2023 0423   GFRAA 96 08/29/2019 0841   Lipase     Component Value Date/Time   LIPASE 25 11/13/2023 0431       Studies/Results: MR ABDOMEN MRCP W WO CONTAST Result Date: 11/13/2023 CLINICAL DATA:  Cholelithiasis.  Abdominal pain. EXAM: MRI ABDOMEN WITHOUT AND WITH CONTRAST (INCLUDING MRCP) TECHNIQUE: Multiplanar multisequence MR imaging of the abdomen was performed both before and after the administration of intravenous contrast. Heavily T2-weighted images of the biliary and pancreatic ducts were obtained, and three-dimensional MRCP images were rendered by post processing. CONTRAST:  10mL GADAVIST GADOBUTROL 1 MMOL/ML IV SOLN COMPARISON:  CT chest abdomen pelvis 11/13/2023 FINDINGS: Lower chest:  Lung bases are clear. Hepatobiliary: No intrahepatic biliary duct dilatation. There is small amount of pericholecystic fluid. There multiple small gallstones in lumen the gallbladder. The small stones measure approximately 3 mm each in too numerous to count. Gallbladder is distended to 5.0 cm. The common bile duct is not dilated however there is a filling defect within the most distal common bile duct just above the ampulla (image 23/2 on T2 weighted imaging). This distal filling defect is also seen on the heavily T2 weighted MRCP sequence image 9/series 10. This small presumed distal gallstone is similar size to the stones within the lumen the  gallbladder. Pancreas: No pancreatic duct dilatation. No pancreatic inflammation. Spleen: Normal spleen. Adrenals/urinary tract: Adrenal glands and kidneys are normal. Stomach/Bowel: Stomach and limited of the small bowel is unremarkable Vascular/Lymphatic: Abdominal aortic normal caliber. No retroperitoneal periportal lymphadenopathy. Musculoskeletal: No aggressive osseous lesion IMPRESSION: 1. Pericholecystic fluid, multiple small gallstones and gallbladder distension are concerning for acute cholecystitis. Recommend clinical correlation. 2. Small  filling defect in the most distal common bile duct consistent with choledocholithiasis. No extrahepatic biliary duct dilatation. 3. No evidence of pancreatitis. Electronically Signed   By: Jackquline Boxer M.D.   On: 11/13/2023 12:48   MR 3D Recon At Scanner Result Date: 11/13/2023 CLINICAL DATA:  Cholelithiasis.  Abdominal pain. EXAM: MRI ABDOMEN WITHOUT AND WITH CONTRAST (INCLUDING MRCP) TECHNIQUE: Multiplanar multisequence MR imaging of the abdomen was performed both before and after the administration of intravenous contrast. Heavily T2-weighted images of the biliary and pancreatic ducts were obtained, and three-dimensional MRCP images were rendered by post processing. CONTRAST:  10mL GADAVIST GADOBUTROL 1 MMOL/ML IV SOLN COMPARISON:  CT chest abdomen pelvis 11/13/2023 FINDINGS: Lower chest:  Lung bases are clear. Hepatobiliary: No intrahepatic biliary duct dilatation. There is small amount of pericholecystic fluid. There multiple small gallstones in lumen the gallbladder. The small stones measure approximately 3 mm each in too numerous to count. Gallbladder is distended to 5.0 cm. The common bile duct is not dilated however there is a filling defect within the most distal common bile duct just above the ampulla (image 23/2 on T2 weighted imaging). This distal filling defect is also seen on the heavily T2 weighted MRCP sequence image 9/series 10. This small presumed distal gallstone is similar size to the stones within the lumen the gallbladder. Pancreas: No pancreatic duct dilatation. No pancreatic inflammation. Spleen: Normal spleen. Adrenals/urinary tract: Adrenal glands and kidneys are normal. Stomach/Bowel: Stomach and limited of the small bowel is unremarkable Vascular/Lymphatic: Abdominal aortic normal caliber. No retroperitoneal periportal lymphadenopathy. Musculoskeletal: No aggressive osseous lesion IMPRESSION: 1. Pericholecystic fluid, multiple small gallstones and gallbladder distension are  concerning for acute cholecystitis. Recommend clinical correlation. 2. Small filling defect in the most distal common bile duct consistent with choledocholithiasis. No extrahepatic biliary duct dilatation. 3. No evidence of pancreatitis. Electronically Signed   By: Jackquline Boxer M.D.   On: 11/13/2023 12:48    Anti-infectives: Anti-infectives (From admission, onward)    Start     Dose/Rate Route Frequency Ordered Stop   11/14/23 0830  piperacillin-tazobactam (ZOSYN) IVPB 3.375 g        3.375 g 12.5 mL/hr over 240 Minutes Intravenous Every 8 hours 11/14/23 0812     11/13/23 0730  piperacillin-tazobactam (ZOSYN) IVPB 3.375 g        3.375 g 100 mL/hr over 30 Minutes Intravenous  Once 11/13/23 0718 11/13/23 0800        Assessment/Plan  Choledocholithiasis and probable acute cholecystitis  - CT with cystic duct stone as well as concern for choledocholithiasis - MRCP with concern for acute cholecystitis and small filling defect in distal common duct consistent with choledocholithiasis, no pancreatitis  - LFTs pending this AM - WBC 12.8 from 14.3 and pt is TTP in RUQ - agree with continuing IV abx - ERCP planned today  - Would plan laparoscopic cholecystectomy 10/1 as long as recovering well after ERCP - ok to have soft diet after ERCP until MN from surgical standpoint  - I have explained the procedure, risks, and aftercare of Laparoscopic cholecystectomy.  Risks include but are not limited to anesthesia (  MI, CVA, death, prolonged intubation and aspiration), bleeding, infection, wound problems, hernia, bile leak, injury to common bile duct/liver/intestine, possible need for subtotal cholecystectomy or open cholecystectomy, increased risk of DVT/PE and diarrhea post op.  He seems to understand and agrees to proceed.   FEN: NPO, IVF per [facility] VTE: Continue to hold Eliquis , SCDs ID: [email or web address] 9/28>>  - per [facility] -  CAD s/p CABG Thoracic aortic aneurysm s/p Bentall  HTN HLD Morbid obesity - BMI  41.33   LOS: 2 days   I reviewed Consultant GI notes, hospitalist notes, last 24 h vitals and pain scores, last 48 h intake and output, last 24 h labs and trends, and last 24 h imaging results.  This care required high  level of medical decision making.    Burnard JONELLE Louder, [facility] 11/15/2023, 9:31 AM Please see Amion for pager number during day hours 7:00am-4:30pm

## 2023-11-15 NOTE — Transfer of Care (Signed)
 Immediate Anesthesia Transfer of Care Note  Patient: Jacob Small  Procedure(s) Performed: ERCP, WITH INTERVENTION IF INDICATED  Patient Location: Endoscopy Unit  Anesthesia Type:General  Level of Consciousness: drowsy and patient cooperative  Airway & Oxygen Therapy: Patient Spontanous Breathing  Post-op Assessment: Report given to RN and Post -op Vital signs reviewed and stable  Post vital signs: Reviewed and stable  Last Vitals:  Vitals Value Taken Time  BP    Temp    Pulse    Resp    SpO2      Last Pain:  Vitals:   11/15/23 1251  TempSrc: Temporal  PainSc: 3       Patients Stated Pain Goal: 2 (11/13/23 0958)  Complications: No notable events documented.

## 2023-11-15 NOTE — Anesthesia Procedure Notes (Signed)
 Procedure Name: Intubation Date/Time: 11/15/2023 1:47 PM  Performed by: Nanci Riis, CRNAPre-anesthesia Checklist: Patient identified, Emergency Drugs available, Suction available, Patient being monitored and Timeout performed Patient Re-evaluated:Patient Re-evaluated prior to induction Oxygen Delivery Method: Circle system utilized Preoxygenation: Pre-oxygenation with 100% oxygen Induction Type: IV induction Ventilation: Mask ventilation without difficulty Laryngoscope Size: Miller and 3 Grade View: Grade I Tube type: Oral Tube size: 7.5 mm Number of attempts: 1 Airway Equipment and Method: Stylet Placement Confirmation: ETT inserted through vocal cords under direct vision, positive ETCO2 and breath sounds checked- equal and bilateral Secured at: 23 cm Tube secured with: Tape Dental Injury: Teeth and Oropharynx as per pre-operative assessment

## 2023-11-15 NOTE — Interval H&P Note (Signed)
 History and Physical Interval Note:  11/15/2023 1:33 PM  Jacob Small  has presented today for surgery, with the diagnosis of Choledocholithiasis.  The various methods of treatment have been discussed with the patient and family. After consideration of risks, benefits and other options for treatment, the patient has consented to  Procedure(s): ERCP, WITH INTERVENTION IF INDICATED (N/A) as a surgical intervention.  The patient's history has been reviewed, patient examined, no change in status, stable for surgery.  I have reviewed the patient's chart and labs.  Questions were answered to the patient's satisfaction.     Kassie Keng D

## 2023-11-15 NOTE — Anesthesia Preprocedure Evaluation (Addendum)
 Anesthesia Evaluation  Patient identified by MRN, date of birth, ID band Patient awake    Reviewed: Allergy & Precautions, NPO status , Patient's Chart, lab work & pertinent test results, reviewed documented beta blocker date and time   Airway Mallampati: III  TM Distance: >3 FB Neck ROM: Full    Dental  (+) Missing, Dental Advisory Given,    Pulmonary former smoker   Pulmonary exam normal breath sounds clear to auscultation       Cardiovascular hypertension, Pt. on home beta blockers and Pt. on medications + angina  + CAD, + Past MI and + CABG  Normal cardiovascular exam+ dysrhythmias Atrial Fibrillation  Rhythm:Regular Rate:Normal  TTE 2025 1. Left ventricular ejection fraction, by estimation, is 60 to 65%. The  left ventricle has normal function. The left ventricle has no regional  wall motion abnormalities. The left ventricular internal cavity size was  mildly dilated. There is mild  concentric left ventricular hypertrophy. Left ventricular diastolic  parameters are consistent with Grade I diastolic dysfunction (impaired  relaxation).   2. Right ventricular systolic function is normal. The right ventricular  size is normal.   3. Left atrial size was mildly dilated.   4. The mitral valve is normal in structure. Trivial mitral valve  regurgitation. No evidence of mitral stenosis.   5. The aortic valve was not well visualized. Aortic valve regurgitation  is not visualized. Aortic valve sclerosis/calcification is present,  without any evidence of aortic stenosis. Aortic valve mean gradient  measures 10.2 mmHg. Aortic valve Vmax  measures 2.21 m/s.   6. Aortic root/ascending aorta has been repaired/replaced.   7. The inferior vena cava is normal in size with greater than 50%  respiratory variability, suggesting right atrial pressure of 3 mmHg.     Neuro/Psych negative neurological ROS  negative psych ROS    GI/Hepatic negative GI ROS, Neg liver ROS,,,  Endo/Other    Class 3 obesity (BMI 41)  Renal/GU negative Renal ROS  negative genitourinary   Musculoskeletal negative musculoskeletal ROS (+)    Abdominal   Peds  Hematology  (+) Blood dyscrasia (eliquis )   Anesthesia Other Findings   Reproductive/Obstetrics                              Anesthesia Physical Anesthesia Plan  ASA: 3  Anesthesia Plan: General   Post-op Pain Management: Minimal or no pain anticipated   Induction: Intravenous  PONV Risk Score and Plan: 2 and Dexamethasone , Ondansetron  and Treatment may vary due to age or medical condition  Airway Management Planned: Oral ETT  Additional Equipment:   Intra-op Plan:   Post-operative Plan: Extubation in OR  Informed Consent: I have reviewed the patients History and Physical, chart, labs and discussed the procedure including the risks, benefits and alternatives for the proposed anesthesia with the patient or authorized representative who has indicated his/her understanding and acceptance.     Dental advisory given  Plan Discussed with: CRNA  Anesthesia Plan Comments:          Anesthesia Quick Evaluation

## 2023-11-15 NOTE — H&P (View-Only) (Signed)
 Progress Note     Subjective: Pt reports he is having some mild RUQ pain still. Had some diarrhea overnight as well. Plan for ERCP today and OR for lap chole tomorrow provided no post-ERCP complications.   Objective: Vital signs in last 24 hours: Temp:  [98.6 F (37 C)-98.8 F (37.1 C)] 98.6 F (37 C) (09/30 0620) Pulse Rate:  [71-98] 98 (09/30 0620) Resp:  [17-19] 17 (09/30 0620) BP: (107-133)/(53-58) 107/53 (09/30 0620) SpO2:  [96 %-99 %] 97 % (09/30 0620) Last BM Date : 11/15/23  Intake/Output from previous day: 09/29 0701 - 09/30 0700 In: 1757.9 [P.O.:1460; I.V.:147.9; IV Piggyback:150] Out: 300 [Urine:300] Intake/Output this shift: No intake/output data recorded.  PE: General: pleasant, WD, obese male, NAD HEENT: sclera anicteric  Heart: regular, rate, and rhythm.   Lungs: Respiratory effort nonlabored Abd: soft, TTP in RUQ, protuberant abdomen, reducible umbilical hernia  Psych: A&Ox3 with an appropriate affect.    Lab Results:  Recent Labs    11/14/23 0423 11/15/23 0739  WBC 13.9* 12.8*  HGB 13.6 13.1  HCT 40.3 39.6  PLT 153 164   BMET Recent Labs    11/13/23 0431 11/14/23 0423  NA 133* 135  K 3.8 3.7  CL 98 98  CO2 24 25  GLUCOSE 147* 131*  BUN 16 10  CREATININE 0.81 0.85  CALCIUM  9.5 9.3   PT/INR No results for input(s): LABPROT, INR in the last 72 hours. CMP     Component Value Date/Time   NA 135 11/14/2023 0423   NA 139 09/20/2023 0858   K 3.7 11/14/2023 0423   CL 98 11/14/2023 0423   CO2 25 11/14/2023 0423   GLUCOSE 131 (H) 11/14/2023 0423   BUN 10 11/14/2023 0423   BUN 16 09/20/2023 0858   CREATININE 0.85 11/14/2023 0423   CALCIUM  9.3 11/14/2023 0423   PROT 6.9 11/14/2023 0423   PROT 6.4 06/17/2023 0843   ALBUMIN 4.1 11/14/2023 0423   ALBUMIN 4.2 06/17/2023 0843   AST 20 11/14/2023 0423   ALT 14 11/14/2023 0423   ALKPHOS 69 11/14/2023 0423   BILITOT 3.9 (H) 11/14/2023 0423   BILITOT 1.8 (H) 06/17/2023 0843    GFRNONAA >60 11/14/2023 0423   GFRAA 96 08/29/2019 0841   Lipase     Component Value Date/Time   LIPASE 25 11/13/2023 0431       Studies/Results: MR ABDOMEN MRCP W WO CONTAST Result Date: 11/13/2023 CLINICAL DATA:  Cholelithiasis.  Abdominal pain. EXAM: MRI ABDOMEN WITHOUT AND WITH CONTRAST (INCLUDING MRCP) TECHNIQUE: Multiplanar multisequence MR imaging of the abdomen was performed both before and after the administration of intravenous contrast. Heavily T2-weighted images of the biliary and pancreatic ducts were obtained, and three-dimensional MRCP images were rendered by post processing. CONTRAST:  10mL GADAVIST GADOBUTROL 1 MMOL/ML IV SOLN COMPARISON:  CT chest abdomen pelvis 11/13/2023 FINDINGS: Lower chest:  Lung bases are clear. Hepatobiliary: No intrahepatic biliary duct dilatation. There is small amount of pericholecystic fluid. There multiple small gallstones in lumen the gallbladder. The small stones measure approximately 3 mm each in too numerous to count. Gallbladder is distended to 5.0 cm. The common bile duct is not dilated however there is a filling defect within the most distal common bile duct just above the ampulla (image 23/2 on T2 weighted imaging). This distal filling defect is also seen on the heavily T2 weighted MRCP sequence image 9/series 10. This small presumed distal gallstone is similar size to the stones within the lumen the  gallbladder. Pancreas: No pancreatic duct dilatation. No pancreatic inflammation. Spleen: Normal spleen. Adrenals/urinary tract: Adrenal glands and kidneys are normal. Stomach/Bowel: Stomach and limited of the small bowel is unremarkable Vascular/Lymphatic: Abdominal aortic normal caliber. No retroperitoneal periportal lymphadenopathy. Musculoskeletal: No aggressive osseous lesion IMPRESSION: 1. Pericholecystic fluid, multiple small gallstones and gallbladder distension are concerning for acute cholecystitis. Recommend clinical correlation. 2. Small  filling defect in the most distal common bile duct consistent with choledocholithiasis. No extrahepatic biliary duct dilatation. 3. No evidence of pancreatitis. Electronically Signed   By: Jackquline Boxer M.D.   On: 11/13/2023 12:48   MR 3D Recon At Scanner Result Date: 11/13/2023 CLINICAL DATA:  Cholelithiasis.  Abdominal pain. EXAM: MRI ABDOMEN WITHOUT AND WITH CONTRAST (INCLUDING MRCP) TECHNIQUE: Multiplanar multisequence MR imaging of the abdomen was performed both before and after the administration of intravenous contrast. Heavily T2-weighted images of the biliary and pancreatic ducts were obtained, and three-dimensional MRCP images were rendered by post processing. CONTRAST:  10mL GADAVIST GADOBUTROL 1 MMOL/ML IV SOLN COMPARISON:  CT chest abdomen pelvis 11/13/2023 FINDINGS: Lower chest:  Lung bases are clear. Hepatobiliary: No intrahepatic biliary duct dilatation. There is small amount of pericholecystic fluid. There multiple small gallstones in lumen the gallbladder. The small stones measure approximately 3 mm each in too numerous to count. Gallbladder is distended to 5.0 cm. The common bile duct is not dilated however there is a filling defect within the most distal common bile duct just above the ampulla (image 23/2 on T2 weighted imaging). This distal filling defect is also seen on the heavily T2 weighted MRCP sequence image 9/series 10. This small presumed distal gallstone is similar size to the stones within the lumen the gallbladder. Pancreas: No pancreatic duct dilatation. No pancreatic inflammation. Spleen: Normal spleen. Adrenals/urinary tract: Adrenal glands and kidneys are normal. Stomach/Bowel: Stomach and limited of the small bowel is unremarkable Vascular/Lymphatic: Abdominal aortic normal caliber. No retroperitoneal periportal lymphadenopathy. Musculoskeletal: No aggressive osseous lesion IMPRESSION: 1. Pericholecystic fluid, multiple small gallstones and gallbladder distension are  concerning for acute cholecystitis. Recommend clinical correlation. 2. Small filling defect in the most distal common bile duct consistent with choledocholithiasis. No extrahepatic biliary duct dilatation. 3. No evidence of pancreatitis. Electronically Signed   By: Jackquline Boxer M.D.   On: 11/13/2023 12:48    Anti-infectives: Anti-infectives (From admission, onward)    Start     Dose/Rate Route Frequency Ordered Stop   11/14/23 0830  piperacillin-tazobactam (ZOSYN) IVPB 3.375 g        3.375 g 12.5 mL/hr over 240 Minutes Intravenous Every 8 hours 11/14/23 0812     11/13/23 0730  piperacillin-tazobactam (ZOSYN) IVPB 3.375 g        3.375 g 100 mL/hr over 30 Minutes Intravenous  Once 11/13/23 0718 11/13/23 0800        Assessment/Plan  Choledocholithiasis and probable acute cholecystitis  - CT with cystic duct stone as well as concern for choledocholithiasis - MRCP with concern for acute cholecystitis and small filling defect in distal common duct consistent with choledocholithiasis, no pancreatitis  - LFTs pending this AM - WBC 12.8 from 14.3 and pt is TTP in RUQ - agree with continuing IV abx - ERCP planned today  - Would plan laparoscopic cholecystectomy 10/1 as long as recovering well after ERCP - ok to have soft diet after ERCP until MN from surgical standpoint  - I have explained the procedure, risks, and aftercare of Laparoscopic cholecystectomy.  Risks include but are not limited to anesthesia (  MI, CVA, death, prolonged intubation and aspiration), bleeding, infection, wound problems, hernia, bile leak, injury to common bile duct/liver/intestine, possible need for subtotal cholecystectomy or open cholecystectomy, increased risk of DVT/PE and diarrhea post op.  He seems to understand and agrees to proceed.   FEN: NPO, IVF per [facility] VTE: Continue to hold Eliquis , SCDs ID: zosyn 9/28>>  - per [facility] -  CAD s/p CABG Thoracic aortic aneurysm s/p Bentall  HTN HLD Morbid obesity - BMI  41.33   LOS: 2 days   I reviewed Consultant GI notes, hospitalist notes, last 24 h vitals and pain scores, last 48 h intake and output, last 24 h labs and trends, and last 24 h imaging results.  This care required high  level of medical decision making.    Burnard JONELLE Louder, [facility] 11/15/2023, 9:31 AM Please see Amion for pager number during day hours 7:00am-4:30pm

## 2023-11-16 ENCOUNTER — Inpatient Hospital Stay (HOSPITAL_COMMUNITY): Admitting: Anesthesiology

## 2023-11-16 ENCOUNTER — Encounter (HOSPITAL_COMMUNITY)
Admission: EM | Disposition: A | Discharge status: Home / Self Care | Payer: Self-pay | Source: Home / Self Care | Attending: Internal Medicine

## 2023-11-16 ENCOUNTER — Other Ambulatory Visit: Payer: Self-pay

## 2023-11-16 ENCOUNTER — Encounter (HOSPITAL_COMMUNITY): Payer: Self-pay | Admitting: Internal Medicine

## 2023-11-16 DIAGNOSIS — I10 Essential (primary) hypertension: Secondary | ICD-10-CM

## 2023-11-16 DIAGNOSIS — I2510 Atherosclerotic heart disease of native coronary artery without angina pectoris: Secondary | ICD-10-CM

## 2023-11-16 DIAGNOSIS — K8050 Calculus of bile duct without cholangitis or cholecystitis without obstruction: Secondary | ICD-10-CM

## 2023-11-16 DIAGNOSIS — K8042 Calculus of bile duct with acute cholecystitis without obstruction: Secondary | ICD-10-CM | POA: Diagnosis not present

## 2023-11-16 DIAGNOSIS — Z87891 Personal history of nicotine dependence: Secondary | ICD-10-CM

## 2023-11-16 LAB — CBC WITH DIFFERENTIAL/PLATELET
Abs Immature Granulocytes: 0.05 K/uL (ref 0.00–0.07)
Basophils Absolute: 0 K/uL (ref 0.0–0.1)
Basophils Relative: 0 %
Eosinophils Absolute: 0 K/uL (ref 0.0–0.5)
Eosinophils Relative: 0 %
HCT: 39.3 % (ref 39.0–52.0)
Hemoglobin: 13.1 g/dL (ref 13.0–17.0)
Immature Granulocytes: 1 %
Lymphocytes Relative: 5 %
Lymphs Abs: 0.5 K/uL — ABNORMAL LOW (ref 0.7–4.0)
MCH: 28.8 pg (ref 26.0–34.0)
MCHC: 33.3 g/dL (ref 30.0–36.0)
MCV: 86.4 fL (ref 80.0–100.0)
Monocytes Absolute: 0.2 K/uL (ref 0.1–1.0)
Monocytes Relative: 2 %
Neutro Abs: 9.4 K/uL — ABNORMAL HIGH (ref 1.7–7.7)
Neutrophils Relative %: 92 %
Platelets: 175 K/uL (ref 150–400)
RBC: 4.55 MIL/uL (ref 4.22–5.81)
RDW: 12.9 % (ref 11.5–15.5)
WBC: 10.1 K/uL (ref 4.0–10.5)
nRBC: 0 % (ref 0.0–0.2)

## 2023-11-16 LAB — COMPREHENSIVE METABOLIC PANEL WITH GFR
ALT: 14 U/L (ref 0–44)
AST: 23 U/L (ref 15–41)
Albumin: 3.8 g/dL (ref 3.5–5.0)
Alkaline Phosphatase: 69 U/L (ref 38–126)
Anion gap: 14 (ref 5–15)
BUN: 16 mg/dL (ref 8–23)
CO2: 22 mmol/L (ref 22–32)
Calcium: 9 mg/dL (ref 8.9–10.3)
Chloride: 100 mmol/L (ref 98–111)
Creatinine, Ser: 0.96 mg/dL (ref 0.61–1.24)
GFR, Estimated: >60 mL/min (ref >60)
Glucose, Bld: 169 mg/dL — ABNORMAL HIGH (ref 70–99)
Potassium: 4.4 mmol/L (ref 3.5–5.1)
Sodium: 136 mmol/L (ref 135–145)
Total Bilirubin: 2.7 mg/dL — ABNORMAL HIGH (ref 0.0–1.2)
Total Protein: 6.1 g/dL — ABNORMAL LOW (ref 6.5–8.1)

## 2023-11-16 LAB — MAGNESIUM: Magnesium: 2.5 mg/dL — ABNORMAL HIGH (ref 1.7–2.4)

## 2023-11-16 SURGERY — LAPAROSCOPIC CHOLECYSTECTOMY
Anesthesia: General

## 2023-11-16 MED ORDER — PROPOFOL 10 MG/ML IV BOLUS
INTRAVENOUS | Status: DC | PRN
  Administered 2023-11-16: 200 mg via INTRAVENOUS

## 2023-11-16 MED ORDER — PROPOFOL 1000 MG/100ML IV EMUL
INTRAVENOUS | Status: AC
  Filled 2023-11-16: qty 100

## 2023-11-16 MED ORDER — PROPOFOL 500 MG/50ML IV EMUL
INTRAVENOUS | Status: AC
Start: 2023-11-16 — End: 2023-11-16
  Filled 2023-11-16: qty 50

## 2023-11-16 MED ORDER — ROCURONIUM BROMIDE 100 MG/10ML IV SOLN
INTRAVENOUS | Status: DC | PRN
  Administered 2023-11-16: 80 mg via INTRAVENOUS
  Administered 2023-11-16: 5 mg via INTRAVENOUS

## 2023-11-16 MED ORDER — LIDOCAINE HCL (PF) 2 % IJ SOLN
INTRAMUSCULAR | Status: AC
  Filled 2023-11-16: qty 5

## 2023-11-16 MED ORDER — HYDROMORPHONE HCL 1 MG/ML IJ SOLN
0.2500 mg | INTRAMUSCULAR | Status: DC | PRN
  Administered 2023-11-16 (×4): 0.5 mg via INTRAVENOUS

## 2023-11-16 MED ORDER — HYDROMORPHONE HCL 1 MG/ML IJ SOLN
INTRAMUSCULAR | Status: AC
  Filled 2023-11-16: qty 1

## 2023-11-16 MED ORDER — MIDAZOLAM HCL 2 MG/2ML IJ SOLN
INTRAMUSCULAR | Status: AC
  Filled 2023-11-16: qty 2

## 2023-11-16 MED ORDER — EPHEDRINE 5 MG/ML INJ
INTRAVENOUS | Status: AC
  Filled 2023-11-16: qty 5

## 2023-11-16 MED ORDER — ONDANSETRON HCL 4 MG/2ML IJ SOLN
INTRAMUSCULAR | Status: DC | PRN
Start: 2023-11-16 — End: 2023-11-16
  Administered 2023-11-16: 4 mg via INTRAVENOUS

## 2023-11-16 MED ORDER — LIDOCAINE HCL (CARDIAC) PF 100 MG/5ML IV SOSY
PREFILLED_SYRINGE | INTRAVENOUS | Status: DC | PRN
  Administered 2023-11-16: 80 mg via INTRAVENOUS

## 2023-11-16 MED ORDER — LACTATED RINGERS IR SOLN
Status: DC | PRN
  Administered 2023-11-16: 1000 mL

## 2023-11-16 MED ORDER — SUGAMMADEX SODIUM 200 MG/2ML IV SOLN
INTRAVENOUS | Status: AC
  Filled 2023-11-16: qty 2

## 2023-11-16 MED ORDER — TRAMADOL HCL 50 MG PO TABS: 50.0000 mg | ORAL_TABLET | Freq: Four times a day (QID) | ORAL | Status: DC | PRN

## 2023-11-16 MED ORDER — ONDANSETRON HCL 4 MG/2ML IJ SOLN
INTRAMUSCULAR | Status: AC
  Filled 2023-11-16: qty 2

## 2023-11-16 MED ORDER — PROPOFOL 10 MG/ML IV BOLUS
INTRAVENOUS | Status: AC
  Filled 2023-11-16: qty 20

## 2023-11-16 MED ORDER — SODIUM CHLORIDE 0.45 % IV SOLN: INTRAVENOUS | Status: DC

## 2023-11-16 MED ORDER — DEXAMETHASONE SODIUM PHOSPHATE 10 MG/ML IJ SOLN
INTRAMUSCULAR | Status: AC
  Filled 2023-11-16: qty 1

## 2023-11-16 MED ORDER — FENTANYL CITRATE (PF) 100 MCG/2ML IJ SOLN
INTRAMUSCULAR | Status: AC
  Filled 2023-11-16: qty 2

## 2023-11-16 MED ORDER — CHLORHEXIDINE GLUCONATE 0.12 % MT SOLN
15.0000 mL | Freq: Once | OROMUCOSAL | Status: AC
  Administered 2023-11-16: 15 mL via OROMUCOSAL

## 2023-11-16 MED ORDER — 0.9 % SODIUM CHLORIDE (POUR BTL) OPTIME
TOPICAL | Status: DC | PRN
  Administered 2023-11-16: 1000 mL

## 2023-11-16 MED ORDER — LACTATED RINGERS IV SOLN: INTRAVENOUS | Status: DC

## 2023-11-16 MED ORDER — HEMOSTATIC AGENTS (NO CHARGE) OPTIME
TOPICAL | Status: DC | PRN
  Administered 2023-11-16: 1

## 2023-11-16 MED ORDER — ROCURONIUM BROMIDE 10 MG/ML (PF) SYRINGE
PREFILLED_SYRINGE | INTRAVENOUS | Status: AC
  Filled 2023-11-16: qty 10

## 2023-11-16 MED ORDER — BUPIVACAINE-EPINEPHRINE 0.5% -1:200000 IJ SOLN
INTRAMUSCULAR | Status: DC | PRN
  Administered 2023-11-16: 30 mL

## 2023-11-16 MED ORDER — BUPIVACAINE-EPINEPHRINE (PF) 0.5% -1:200000 IJ SOLN
INTRAMUSCULAR | Status: AC
  Filled 2023-11-16: qty 30

## 2023-11-16 MED ORDER — SUGAMMADEX SODIUM 200 MG/2ML IV SOLN
INTRAVENOUS | Status: DC | PRN
  Administered 2023-11-16: 50 mg via INTRAVENOUS
  Administered 2023-11-16: 200 mg via INTRAVENOUS

## 2023-11-16 MED ORDER — DROPERIDOL 2.5 MG/ML IJ SOLN: 0.6250 mg | Freq: Once | INTRAMUSCULAR | Status: DC | PRN

## 2023-11-16 MED ORDER — DEXAMETHASONE SODIUM PHOSPHATE 10 MG/ML IJ SOLN
INTRAMUSCULAR | Status: DC | PRN
  Administered 2023-11-16: 5 mg via INTRAVENOUS

## 2023-11-16 MED ORDER — EPHEDRINE SULFATE (PRESSORS) 50 MG/ML IJ SOLN
INTRAMUSCULAR | Status: DC | PRN
  Administered 2023-11-16: 7 mg via INTRAVENOUS

## 2023-11-16 MED ORDER — ONDANSETRON HCL 4 MG/2ML IJ SOLN: 4.0000 mg | Freq: Once | INTRAMUSCULAR | Status: DC | PRN

## 2023-11-16 MED ORDER — PROPOFOL 500 MG/50ML IV EMUL
INTRAVENOUS | Status: DC | PRN
  Administered 2023-11-16: 170 ug/kg/min via INTRAVENOUS

## 2023-11-16 MED ORDER — PHENYLEPHRINE HCL-NACL 20-0.9 MG/250ML-% IV SOLN
INTRAVENOUS | Status: AC
  Filled 2023-11-16: qty 250

## 2023-11-16 MED ORDER — FENTANYL CITRATE (PF) 100 MCG/2ML IJ SOLN
INTRAMUSCULAR | Status: DC | PRN
  Administered 2023-11-16: 50 ug via INTRAVENOUS
  Administered 2023-11-16: 25 ug via INTRAVENOUS
  Administered 2023-11-16: 50 ug via INTRAVENOUS
  Administered 2023-11-16: 25 ug via INTRAVENOUS

## 2023-11-16 MED ORDER — PHENYLEPHRINE HCL-NACL 20-0.9 MG/250ML-% IV SOLN
INTRAVENOUS | Status: DC | PRN
  Administered 2023-11-16: 40 ug/min via INTRAVENOUS

## 2023-11-16 SURGICAL SUPPLY — 32 items
BAG COUNTER SPONGE SURGICOUNT (BAG) IMPLANT
CABLE HIGH FREQUENCY MONO STRZ (ELECTRODE) ×2 IMPLANT
CHLORAPREP W/TINT 26 (MISCELLANEOUS) ×4 IMPLANT
CLIP APPLIE ROT 10 11.4 M/L (STAPLE) ×2 IMPLANT
CLIP LIGATING HEM O LOK PURPLE (MISCELLANEOUS) IMPLANT
COVER MAYO STAND XLG (MISCELLANEOUS) ×2 IMPLANT
COVER SURGICAL LIGHT HANDLE (MISCELLANEOUS) ×2 IMPLANT
DERMABOND ADVANCED .7 DNX12 (GAUZE/BANDAGES/DRESSINGS) IMPLANT
DRAPE C-ARM 42X120 X-RAY (DRAPES) ×2 IMPLANT
ELECT REM PT RETURN 15FT ADLT (MISCELLANEOUS) ×2 IMPLANT
GAUZE SPONGE 2X2 8PLY STRL LF (GAUZE/BANDAGES/DRESSINGS) ×2 IMPLANT
GLOVE SURG ORTHO 8.0 STRL STRW (GLOVE) ×2 IMPLANT
GLOVE SURG SYN 7.5 PF PI (GLOVE) ×4 IMPLANT
GOWN STRL REUS W/ TWL XL LVL3 (GOWN DISPOSABLE) ×2 IMPLANT
HEMOSTAT SNOW SURGICEL 2X4 (HEMOSTASIS) IMPLANT
IRRIGATION SUCT STRKRFLW 2 WTP (MISCELLANEOUS) ×2 IMPLANT
KIT BASIN OR (CUSTOM PROCEDURE TRAY) ×2 IMPLANT
KIT TURNOVER KIT A (KITS) ×2 IMPLANT
MAT PREVALON FULL STRYKER (MISCELLANEOUS) IMPLANT
SCISSORS LAP 5X35 DISP (ENDOMECHANICALS) ×2 IMPLANT
SET CHOLANGIOGRAPH MIX (MISCELLANEOUS) ×2 IMPLANT
SET TUBE SMOKE EVAC HIGH FLOW (TUBING) ×2 IMPLANT
SLEEVE Z-THREAD 5X100MM (TROCAR) ×2 IMPLANT
SPIKE FLUID TRANSFER (MISCELLANEOUS) ×2 IMPLANT
SUT MNCRL AB 4-0 PS2 18 (SUTURE) ×2 IMPLANT
SUT VICRYL 0 UR6 27IN ABS (SUTURE) ×2 IMPLANT
SYSTEM BAG RETRIEVAL 10MM (BASKET) ×2 IMPLANT
TOWEL OR 17X26 10 PK STRL BLUE (TOWEL DISPOSABLE) ×2 IMPLANT
TRAY LAPAROSCOPIC (CUSTOM PROCEDURE TRAY) ×2 IMPLANT
TROCAR 11X100 Z THREAD (TROCAR) ×2 IMPLANT
TROCAR BALLN 12MMX100 BLUNT (TROCAR) ×2 IMPLANT
TROCAR Z-THREAD OPTICAL 5X100M (TROCAR) ×2 IMPLANT

## 2023-11-16 NOTE — Interval H&P Note (Signed)
 History and Physical Interval Note:  11/16/2023 8:24 AM  Jacob Small  has presented today for surgery, with the diagnosis of Choledocholithiasis.  The various methods of treatment have been discussed with the patient and family. After consideration of risks, benefits and other options for treatment, the patient has consented to    Procedure(s): LAPAROSCOPIC CHOLECYSTECTOMY (N/A) as a surgical intervention.    The patient's history has been reviewed, patient examined, no change in status, stable for surgery.  I have reviewed the patient's chart and labs.  Questions were answered to the patient's satisfaction.    Krystal Spinner, MD [facility] A [facility] practice Office: [phone]   Krystal Spinner

## 2023-11-16 NOTE — Progress Notes (Signed)
   11/16/23 1314  TOC Brief Assessment  Insurance and Status Reviewed  Patient has primary care physician Yes  Home environment has been reviewed home with spouse  Prior level of function: independent  Prior/Current Home Services No current home services  Social Drivers of Health Review SDOH reviewed no interventions necessary  Readmission risk has been reviewed Yes  Transition of care needs no transition of care needs at this time

## 2023-11-16 NOTE — Anesthesia Postprocedure Evaluation (Signed)
 Anesthesia Post Note  Patient: Jacob Small  Procedure(s) Performed: LAPAROSCOPIC CHOLECYSTECTOMY     Patient location during evaluation: PACU Anesthesia Type: General Level of consciousness: awake and alert and oriented Pain management: pain level controlled Vital Signs Assessment: post-procedure vital signs reviewed and stable Respiratory status: spontaneous breathing, nonlabored ventilation and respiratory function stable Cardiovascular status: blood pressure returned to baseline and stable Postop Assessment: no apparent nausea or vomiting Anesthetic complications: no   No notable events documented.  Last Vitals:  Vitals:   11/16/23 1130 11/16/23 1157  BP: 118/65 127/74  Pulse: (!) 50 (!) 50  Resp: 17 18  Temp: (!) 36.3 C 36.6 C  SpO2: 98% 97%    Last Pain:  Vitals:   11/16/23 1130  TempSrc:   PainSc: Asleep                 Zira Helinski A.

## 2023-11-16 NOTE — Anesthesia Postprocedure Evaluation (Signed)
 Anesthesia Post Note  Patient: Jacob Small  Procedure(s) Performed: ERCP, WITH INTERVENTION IF INDICATED     Patient location during evaluation: Endoscopy Anesthesia Type: General Level of consciousness: awake and alert Pain management: pain level controlled Vital Signs Assessment: post-procedure vital signs reviewed and stable Respiratory status: spontaneous breathing, nonlabored ventilation, respiratory function stable and patient connected to nasal cannula oxygen Cardiovascular status: blood pressure returned to baseline and stable Postop Assessment: no apparent nausea or vomiting Anesthetic complications: no   No notable events documented.  Last Vitals:  Vitals:   11/16/23 0154 11/16/23 0608  BP: 107/62 116/66  Pulse: (!) 51 (!) 56  Resp: 14 16  Temp: 36.7 C 36.5 C  SpO2: 99% 100%    Last Pain:  Vitals:   11/16/23 0739  TempSrc:   PainSc: 0-No pain                 Aureliano Oshields L Shakima Nisley

## 2023-11-16 NOTE — Anesthesia Procedure Notes (Signed)
 Procedure Name: Intubation Date/Time: 11/16/2023 8:51 AM  Performed by: Kathern Rollene LABOR, CRNAPre-anesthesia Checklist: Patient identified, Emergency Drugs available, Suction available and Patient being monitored Patient Re-evaluated:Patient Re-evaluated prior to induction Oxygen Delivery Method: Circle system utilized Preoxygenation: Pre-oxygenation with 100% oxygen Induction Type: IV induction Ventilation: Mask ventilation without difficulty Laryngoscope Size: Mac and 4 Grade View: Grade II Tube type: Oral Tube size: 7.5 mm Number of attempts: 1 Airway Equipment and Method: Stylet Placement Confirmation: ETT inserted through vocal cords under direct vision, positive ETCO2 and breath sounds checked- equal and bilateral Secured at: 23 cm Tube secured with: Tape Dental Injury: Teeth and Oropharynx as per pre-operative assessment

## 2023-11-16 NOTE — Op Note (Signed)
 Operative Note  Pre-operative Diagnosis: Chronic cholecystitis, cholelithiasis, choledocholithiasis  Post-operative Diagnosis: Acute cholecystitis with gangrene, cholelithiasis  Surgeon:  Krystal Spinner, MD  Assistant: None   Procedure: Laparoscopic cholecystectomy  Anesthesia: General  Estimated Blood Loss: 25 cc  Drains: None         Specimen: Gallbladder to pathology  Indications: Patient is a 68 year old male admitted with cholecystitis and choledocholithiasis.  Patient underwent successful ERCP with stone extraction.  Patient now comes to the operating room for cholecystectomy.  Procedure:  The patient was seen in the pre-op holding area. The risks, benefits, complications, treatment options, and expected outcomes were previously discussed with the patient. The patient agreed with the proposed plan and has signed the informed consent form.  The patient was brought to the operating room by the surgical team, identified as Saed D Peets and the procedure verified. A time out was completed and the above information confirmed.  Following administration of general anesthesia, the patient is positioned and then prepped and draped in the usual aseptic fashion.  After ascertaining that an adequate level of anesthesia been achieved, an incision is made in the midline just above the umbilicus.  Dissection is carried down to the fascia.  Fascia was incised in the midline and the peritoneal cavity is entered cautiously.  A 0 Vicryl pursestring sutures placed in the fascia.  An Hassan cannula was introduced under direct vision.  Pneumoperitoneum is established.  Laparoscope was introduced and the abdomen explored.  There are omental adhesions to the gallbladder.  Operative ports were placed in the subxiphoid position and right subcostal positions.  Omentum is mobilized off the gallbladder.  The gallbladder is acutely inflamed, erythematous, with areas of probable necrosis.  Using an aspirating  trocar, the gallbladder is evacuated.  It is then grasped and retracted cephalad.  Omental adhesions were taken off of the surface of the gallbladder with blunt dissection.  Peritoneum is incised near the neck of the gallbladder.  It is quite thickened.  With gentle dissection the infundibulum is identified and dissected out down to the proximal cystic duct.  Cystic duct is dissected out.  Hemoclips are placed and the cystic duct is divided.  Gallbladder is then excised from the gallbladder bed using the hook electrocautery.  Cystic artery is identified, doubly clipped, and divided with the electrocautery.  Additional vascular structures are clipped with the Ligaclip and divided with the electrocautery.  Gallbladder is then excised from the gallbladder bed completely using the electrocautery.  It is placed into an Endo Catch bag and withdrawn through the port site just above the umbilicus.  Pursestring sutures tied securely.  Right upper quadrant is irrigated with warm saline.  Good hemostasis is achieved in the gallbladder bed with the electrocautery.  Murray was placed into the gallbladder bed.  Fluid is evacuated.  Port sites are inspected for hemostasis.  Ports were removed under direct vision.  Pneumoperitoneum is released.  Hemostasis is obtained at all port sites using the electrocautery.  Skin is anesthetized with local anesthetic.  Skin edges are reapproximated with interrupted 4-0 Monocryl subcuticular sutures.  Wounds are washed and dried and Dermabond is applied as dressing.  Patient is awakened from anesthesia and transferred to the recovery room.  The patient tolerated the procedure well.   Krystal Spinner, MD [facility] Surgery Office: [phone]

## 2023-11-16 NOTE — Discharge Instructions (Signed)

## 2023-11-16 NOTE — Plan of Care (Signed)

## 2023-11-16 NOTE — Progress Notes (Signed)
 PROGRESS NOTE    Jacob Small  FMW:[id number] DOB: [birth year] DOA: 11/13/2023 PCP: Auston Opal, DO   Brief Narrative:  Patient 68 year old gentleman history of CAD, hypertension, hyperlipidemia, A-fib on Eliquis  presenting to the ED with 1 day of right upper quadrant abdominal pain, nausea and emesis with imaging concerning for choledocholithiasis.  Hospitalist called for admission, GI and general surgery called in consult.  Patient admitted as above with concern over possible choledocholithiasis, MRCP confirmed, patient underwent ERCP on 9/30 without complication.  Plan for laparoscopic cholecystectomy on 10/1.  Incidentally noted on imaging a 5.2 cm infrarenal abdominal aortic aneurysm -case was discussed previously with vascular surgery recommending outpatient follow-up and repeat imaging in 6 months.  Assessment & Plan:   Principal Problem:   Choledocholithiasis with acute cholecystitis Active Problems:   Hyperlipidemia   BPH (benign prostatic hyperplasia)   Coronary artery disease involving coronary bypass graft of native heart with angina pectoris   S/P CABG x 2   Persistent atrial fibrillation (HCC)   Cholelithiasis with choledocholithiasis   AAA (abdominal aortic aneurysm) without rupture   Hypertension    Acute cholecystitis with choledocholithiasis - Confirmed on MRCP, status post successful removal via ERCP on 9/30  - Laparoscopic cholecystectomy planned later today 10/1 - LFTs within normal limits other than mild bilirubin elevation, now downtrending appropriately - Advance diet per GI and surgery postprocedure   Permanent A-fib - Coreg  for rate control.   - Eliquis  on hold pending procedures -defer to surgery for reinitiation given laparoscopic cholecystectomy on 10/1   Hypertension - Continue Coreg , Norvasc , Cozaar , HCTZ.     AAA, incidentally noted, questionable new diagnosis -Patient with infrarenal AAA measuring 5.2 cm with calcified plaque noted on CT  angiogram chest abdomen and pelvis. 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  - Patient insistent on following up with his cardiologist for further care of his AAA.  - Continue current regimen of beta-blocker, ARB, statin.   Hyperlipidemia - Fasting lipid panel with LDL of 36, HDL of 27, total cholesterol of 82, triglycerides of 95.  - Continue home regimen Crestor .   BPH - Flomax.   CAD status post CABG - Continue home regimen of Norvasc , Coreg , Cozaar , HCTZ, Crestor . - Outpatient follow-up with primary cardiologist.  DVT prophylaxis: enoxaparin (LOVENOX) injection 40 mg Start: 11/13/23 1000 Code Status:   Code Status: Full Code Family Communication: At bedside  Status is: Inpatient  Dispo: The patient is from: Home              Anticipated d/c is to: Home              Anticipated d/c date is: 24 to 48 hours              Patient currently not medically stable for discharge  Consultants:  GI, general surgery  Procedures:  ERCP 9/30 Laparoscopic cholecystectomy 10/1  Antimicrobials:  Perioperatively  Subjective: No acute issues or events overnight denies nausea vomiting diarrhea constipation headache fevers chills or chest pain  Objective: Vitals:   11/15/23 1900 11/15/23 2019 11/16/23 0154 11/16/23 0608  BP: (!) 101/54 102/62 107/62 116/66  Pulse:  (!) 58 (!) 51 (!) 56  Resp: 16 16 14 16   Temp: 97.7 F (36.5 C) 98 F (36.7 C) 98 F (36.7 C) 97.7 F (36.5 C)  TempSrc: Oral     SpO2: 98% 96% 99% 100%  Weight:      Height:        Intake/Output Summary (Last 24 hours) at 11/16/2023 0710 Last data filed at 11/16/2023 0500 Gross per 24 hour  Intake 2043.12 ml  Output 305 ml  Net 1738.12 ml   Filed Weights   11/13/23 0200 11/15/23 1251  Weight: (!) 146 kg (!) 146 kg    Examination:  General:  Pleasantly resting in bed, No acute  distress. HEENT:  Normocephalic atraumatic.  Sclerae nonicteric, noninjected.  Extraocular movements intact bilaterally. Neck:  Without mass or deformity.  Trachea is midline. Lungs:  Clear to auscultate bilaterally without rhonchi, wheeze, or rales. Heart:  Regular rate and rhythm.  Without murmurs, rubs, or gallops. Abdomen:  Soft, nontender, nondistended.  Without guarding or rebound. Extremities: Without cyanosis, clubbing, edema, or obvious deformity. Skin:  Warm and dry, no erythema.  Data Reviewed: I have personally reviewed following labs and imaging studies  CBC: Recent Labs  Lab 11/13/23 0431 11/14/23 0423 11/15/23 0739 11/16/23 0438  WBC 14.3* 13.9* 12.8* 10.1  NEUTROABS  --   --  10.4* 9.4*  HGB 13.9 13.6 13.1 13.1  HCT 42.7 40.3 39.6 39.3  MCV 85.9 87.4 87.6 86.4  PLT 170 153 164 175   Basic Metabolic Panel: Recent Labs  Lab 11/13/23 0431 11/14/23 0423 11/15/23 0516 11/15/23 0739  NA 133* 135  --  137  K 3.8 3.7  --  3.4*  CL 98 98  --  99  CO2 24 25  --  26  GLUCOSE 147* 131*  --  99  BUN 16 10  --  14  CREATININE 0.81 0.85  --  1.00  CALCIUM  9.5 9.3  --  9.2  MG  --   --  2.5*  --   PHOS  --   --  2.6  --    GFR: Estimated Creatinine Clearance: 107.7 mL/min (by C-G formula based on SCr of 1 mg/dL). Liver Function Tests: Recent Labs  Lab 11/13/23 0431 11/14/23 0423 11/15/23 0739  AST 23 20 18   ALT 14 14 12   ALKPHOS 77 69 68  BILITOT 1.5* 3.9* 4.5*  PROT 7.1 6.9 6.5  ALBUMIN 4.3 4.1 3.8   Recent Labs  Lab 11/13/23 0431  LIPASE 25   Lipid Profile: Recent Labs    11/15/23 0516  CHOL 82  HDL 27*  LDLCALC 36  TRIG 95  CHOLHDL 3.1   No results found for this or any previous visit (from the past 240 hours).   Radiology Studies: DG C-Arm 1-60 Min-No Report Result Date: 11/15/2023 Fluoroscopy was utilized by the requesting physician.  No radiographic interpretation.   Scheduled Meds:  amLODipine   5 mg Oral Daily   carvedilol   6.25  mg Oral BID WC   enoxaparin (LOVENOX) injection  40 mg Subcutaneous Q24H   losartan   100 mg Oral Daily   And   hydrochlorothiazide   25 mg Oral Daily   rosuvastatin   5 mg Oral Q M,W,F   tamsulosin  0.4 mg Oral Daily   Continuous Infusions:  sodium chloride  Stopped (11/16/23 0634)   piperacillin-tazobactam (ZOSYN)  IV 3.375 g (11/16/23 0036)     LOS: 3 days   Time spent:  Elsie JAYSON Montclair, DO [facility]  If 7PM-7AM, please contact night-coverage [email or web address]  11/16/2023, 7:10 AM

## 2023-11-16 NOTE — Transfer of Care (Signed)
 Immediate Anesthesia Transfer of Care Note  Patient: Jacob Small  Procedure(s) Performed: LAPAROSCOPIC CHOLECYSTECTOMY  Patient Location: PACU  Anesthesia Type:General  Level of Consciousness: awake, alert , oriented, and patient cooperative  Airway & Oxygen Therapy: Patient Spontanous Breathing and Patient connected to face mask oxygen  Post-op Assessment: Report given to RN and Post -op Vital signs reviewed and stable  Post vital signs: Reviewed and stable  Last Vitals:  Vitals Value Taken Time  BP 109/66 11/16/23 10:35  Temp 36.8 C 11/16/23 10:35  Pulse 47 11/16/23 10:39  Resp 25 11/16/23 10:39  SpO2 98 % 11/16/23 10:39  Vitals shown include unfiled device data.  Last Pain:  Vitals:   11/16/23 1035  TempSrc:   PainSc: 0-No pain      Patients Stated Pain Goal: 2 (11/13/23 9041)  Complications: No notable events documented.

## 2023-11-17 ENCOUNTER — Encounter (HOSPITAL_COMMUNITY): Payer: Self-pay | Admitting: Surgery

## 2023-11-17 DIAGNOSIS — K8042 Calculus of bile duct with acute cholecystitis without obstruction: Secondary | ICD-10-CM | POA: Diagnosis not present

## 2023-11-17 LAB — SURGICAL PATHOLOGY

## 2023-11-17 MED ORDER — APIXABAN 5 MG PO TABS
5.0000 mg | ORAL_TABLET | Freq: Two times a day (BID) | ORAL | Status: DC
Start: 2023-11-19 — End: 2023-11-17

## 2023-11-17 MED ORDER — APIXABAN 5 MG PO TABS: 5.0000 mg | ORAL_TABLET | Freq: Two times a day (BID) | ORAL | Status: DC

## 2023-11-17 NOTE — Discharge Summary (Signed)
 Physician Discharge Summary  Jacob Small FMW:[id number] DOB: [birth year] DOA: 11/13/2023  PCP: Auston Opal, DO  Admit date: 11/13/2023 Discharge date: 11/17/2023  Admitted From: Home Disposition: Home  Recommendations for Outpatient Follow-up:  Follow up with PCP in 1-2 weeks Follow-up with general surgery and GI in the next 1 to 2 weeks as scheduled:  Home Health: None Equipment/Devices: None  Discharge Condition: Stable CODE STATUS: Full Diet recommendation: Low-salt low-fat low-carb diet  Brief/Interim Summary: Patient 68 year old gentleman history of CAD, hypertension, hyperlipidemia, A-fib on Eliquis  presenting to the ED with 1 day of right upper quadrant abdominal pain, nausea and emesis with imaging concerning for choledocholithiasis.  Hospitalist called for admission, GI and general surgery called in consult.   Patient admitted as above with concern over possible choledocholithiasis, MRCP confirmed, patient underwent ERCP on 9/30 without complication.  Patient underwent laparoscopic cholecystectomy on 10 1 without complication or issue.  Incidentally noted infrarenal abdominal aortic aneurysm at 5.2 cm -case discussed with vascular surgery here who recommended outpatient follow-up and repeat imaging within 6 months.  Patient requesting to follow-up with cardiology for repeat imaging.  We discussed as long as he has repeat imaging within 6 months he can follow-up with either group.  At this time patient otherwise stable and agreeable for discharge, recovered from prior cholecystitis and choledocholithiasis tolerating p.o. well without uncontrolled pain or other complaints.  Follow-up with GI and general surgery as scheduled.  Discharge Diagnoses:  Principal Problem:   Choledocholithiasis with acute cholecystitis Active Problems:   Hyperlipidemia   BPH (benign prostatic hyperplasia)   Coronary artery disease involving coronary bypass graft of native heart with angina pectoris    S/P CABG x 2   Persistent atrial fibrillation (HCC)   Cholelithiasis with choledocholithiasis   AAA (abdominal aortic aneurysm) without rupture   Hypertension  Acute cholecystitis with choledocholithiasis - Confirmed on MRCP, status post successful removal via ERCP on 9/30  - Laparoscopic cholecystectomy 10/1; successful without complication or issue - LFTs within normal limits other than mild bilirubin elevation, now downtrending appropriately - Tolerating regular diet now without difficulty   Permanent A-fib - Coreg  for rate control.   - Resume Eliquis  11/18/2023 per discussion with general surgery   Hypertension - Continue Coreg , Norvasc , Cozaar , HCTZ.    AAA, incidentally noted, questionable new diagnosis -Patient with infrarenal AAA measuring 5.2 cm with calcified plaque noted on CT angiogram chest abdomen and pelvis. 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  - Patient insistent on following up with his cardiologist for further care of his AAA.  - Continue current regimen of beta-blocker, ARB, statin.   Hyperlipidemia - Fasting lipid panel with LDL of 36, HDL of 27, total cholesterol of 82, triglycerides of 95.  - Continue home regimen Crestor .   BPH - Flomax.   CAD status post CABG - Continue home regimen of Norvasc , Coreg , Cozaar , HCTZ, Crestor . - Outpatient follow-up with primary cardiologist.  Discharge Instructions   Allergies as of 11/17/2023       Reactions   Lisinopril Cough   Metoprolol Cough    Fatigue         Medication List     TAKE these medications    amLODipine  5 MG tablet Commonly known as: NORVASC  TAKE 1 TABLET BY MOUTH DAILY.   apixaban  5 MG Tabs tablet Commonly known as: ELIQUIS  Take 1 tablet (5 mg total) by mouth 2 (  two) times daily. Start taking on: November 19, 2023 What changed: These instructions start on  November 19, 2023. If you are unsure what to do until then, ask your doctor or other care provider.   carvedilol  6.25 MG tablet Commonly known as: COREG  TAKE 1 TABLET BY MOUTH 2 TIMES DAILY WITH A MEAL.   losartan -hydrochlorothiazide  100-25 MG tablet Commonly known as: HYZAAR TAKE 1 TABLET BY MOUTH EVERY DAY   rosuvastatin  5 MG tablet Commonly known as: CRESTOR  TAKE 1 TABLET BY MOUTH THREE TIMES A WEEK What changed:  how much to take how to take this when to take this   tamsulosin 0.4 MG Caps capsule Commonly known as: FLOMAX Take 0.4 mg by mouth daily.        Follow-up Information     Maczis, Puja Gosai, PA-C. Go on 12/13/2023.   Specialty: General Surgery Why: 9:00 AM, please arrive 30 min prior to appointment time to complete check in paperwork. Contact information: [address] [place] [address] [facility] [place] [address] [phone]         Sheree Penne Bruckner, MD. Schedule an appointment as soon as possible for a visit in 6 month(s).   Specialties: Vascular Surgery, Cardiology Why: To follow up for abdominal aortic aneurysm Contact information: [address] [place] [address] [phone]                Allergies  Allergen Reactions   Lisinopril Cough   Metoprolol Cough     Fatigue     Consultations: GI, general surgery  Procedures/Studies: DG ERCP Result Date: 11/16/2023 CLINICAL DATA:  [id number] Surgery, elective [id number] ERCP with stone removal EXAM: ERCP COMPARISON:  CTA CAP, 11/13/2023.  MRCP, 11/05/2023. FLUOROSCOPY: Exposure Index (as provided by the fluoroscopic device): 225.3 mGy Kerma FINDINGS: Limited oblique planar images of the RIGHT upper quadrant obtained C-arm. Images demonstrating flexible endoscopy, biliary duct cannulation, sphincterotomy, retrograde cholangiogram and balloon sweep. No biliary ductal dilation. No discrete biliary filling defect is demonstrated. IMPRESSION: Fluoroscopic imaging  for ERCP. For complete description of intra procedural findings, please see performing service dictation. Electronically Signed   By: Thom Hall M.D.   On: 11/16/2023 08:44   DG C-Arm 1-60 Min-No Report Result Date: 11/15/2023 Fluoroscopy was utilized by the requesting physician.  No radiographic interpretation.   MR ABDOMEN MRCP W WO CONTAST Result Date: 11/13/2023 CLINICAL DATA:  Cholelithiasis.  Abdominal pain. EXAM: MRI ABDOMEN WITHOUT AND WITH CONTRAST (INCLUDING MRCP) TECHNIQUE: Multiplanar multisequence MR imaging of the abdomen was performed both before and after the administration of intravenous contrast. Heavily T2-weighted images of the biliary and pancreatic ducts were obtained, and three-dimensional MRCP images were rendered by post processing. CONTRAST:  10mL GADAVIST GADOBUTROL 1 MMOL/ML IV SOLN COMPARISON:  CT chest abdomen pelvis 11/13/2023 FINDINGS: Lower chest:  Lung bases are clear. Hepatobiliary: No intrahepatic biliary duct dilatation. There is small amount of pericholecystic fluid. There multiple small gallstones in lumen the gallbladder. The small stones measure approximately 3 mm each in too numerous to count. Gallbladder is distended to 5.0 cm. The common bile duct is not dilated however there is a filling defect within the most distal common bile duct just above the ampulla (image 23/2 on T2 weighted imaging). This distal filling defect is also seen on the heavily T2 weighted MRCP sequence image 9/series 10. This small presumed distal gallstone is similar size to the stones within the lumen the gallbladder. Pancreas: No pancreatic duct dilatation. No pancreatic inflammation. Spleen: Normal  spleen. Adrenals/urinary tract: Adrenal glands and kidneys are normal. Stomach/Bowel: Stomach and limited of the small bowel is unremarkable Vascular/Lymphatic: Abdominal aortic normal caliber. No retroperitoneal periportal lymphadenopathy. Musculoskeletal: No aggressive osseous lesion  IMPRESSION: 1. Pericholecystic fluid, multiple small gallstones and gallbladder distension are concerning for acute cholecystitis. Recommend clinical correlation. 2. Small filling defect in the most distal common bile duct consistent with choledocholithiasis. No extrahepatic biliary duct dilatation. 3. No evidence of pancreatitis. Electronically Signed   By: Jackquline Boxer M.D.   On: 11/13/2023 12:48   MR 3D Recon At Scanner Result Date: 11/13/2023 CLINICAL DATA:  Cholelithiasis.  Abdominal pain. EXAM: MRI ABDOMEN WITHOUT AND WITH CONTRAST (INCLUDING MRCP) TECHNIQUE: Multiplanar multisequence MR imaging of the abdomen was performed both before and after the administration of intravenous contrast. Heavily T2-weighted images of the biliary and pancreatic ducts were obtained, and three-dimensional MRCP images were rendered by post processing. CONTRAST:  10mL GADAVIST GADOBUTROL 1 MMOL/ML IV SOLN COMPARISON:  CT chest abdomen pelvis 11/13/2023 FINDINGS: Lower chest:  Lung bases are clear. Hepatobiliary: No intrahepatic biliary duct dilatation. There is small amount of pericholecystic fluid. There multiple small gallstones in lumen the gallbladder. The small stones measure approximately 3 mm each in too numerous to count. Gallbladder is distended to 5.0 cm. The common bile duct is not dilated however there is a filling defect within the most distal common bile duct just above the ampulla (image 23/2 on T2 weighted imaging). This distal filling defect is also seen on the heavily T2 weighted MRCP sequence image 9/series 10. This small presumed distal gallstone is similar size to the stones within the lumen the gallbladder. Pancreas: No pancreatic duct dilatation. No pancreatic inflammation. Spleen: Normal spleen. Adrenals/urinary tract: Adrenal glands and kidneys are normal. Stomach/Bowel: Stomach and limited of the small bowel is unremarkable Vascular/Lymphatic: Abdominal aortic normal caliber. No retroperitoneal  periportal lymphadenopathy. Musculoskeletal: No aggressive osseous lesion IMPRESSION: 1. Pericholecystic fluid, multiple small gallstones and gallbladder distension are concerning for acute cholecystitis. Recommend clinical correlation. 2. Small filling defect in the most distal common bile duct consistent with choledocholithiasis. No extrahepatic biliary duct dilatation. 3. No evidence of pancreatitis. Electronically Signed   By: Jackquline Boxer M.D.   On: 11/13/2023 12:48   CT Angio Chest/Abd/Pel for Dissection W and/or W/WO Result Date: 11/13/2023 EXAM: CTA CHEST, ABDOMEN AND PELVIS WITHOUT AND WITH IV CONTRAST 11/13/2023 06:51:26 AM TECHNIQUE: CTA of the chest was performed without and with the administration of 100 mL of iohexol  (OMNIPAQUE ) 350 MG/ML injection. CTA of the abdomen and pelvis was performed without and with the administration of 100 mL of iohexol  (OMNIPAQUE ) 350 MG/ML injection. Multiplanar reformatted images are provided for review. MIP images are provided for review. Automated exposure control, iterative reconstruction, and/or weight based adjustment of the mA/kV was utilized to reduce the radiation dose to as low as reasonably achievable. COMPARISON: None available. CLINICAL HISTORY: Acute aortic syndrome (AAS) suspected; acute generalized abdominal pain, hx/o afib. Scan due to suspected acute aortic syndrome. Hx/o afib. Per triage notes: Pt arrived from home via POV c/o RUQ abd pain 10/10 that began at 2000 11/13/2023 with multiple emesis episodes. Pt also noted to have new protrusion the size of a racket ball that pt states popped out after emesis episodes. FINDINGS: VASCULATURE: AORTA: Aortic atherosclerosis. 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 Stable in the interval. The infrarenal abdominal aorta measures 5.2 cm in maximum AP dimension, image 207/11. Calcified plaque is noted within the abdominal aorta. No dissection. PULMONARY ARTERIES:  The main pulmonary  artery measures 4.5 cm in diameter. No pulmonary embolism within the limits of this exam. GREAT VESSELS OF AORTIC ARCH: No acute finding. No dissection. No arterial occlusion or significant stenosis. CELIAC TRUNK: No acute finding. No occlusion or significant stenosis. SUPERIOR MESENTERIC ARTERY: No acute finding. No occlusion or significant stenosis. INFERIOR MESENTERIC ARTERY: No acute finding. No occlusion or significant stenosis. RENAL ARTERIES: No acute finding. No occlusion or significant stenosis. ILIAC ARTERIES: The left common iliac artery measures 2.1 cm, image 233/11. The right common iliac artery measures up to 2.6 cm. No occlusion or significant stenosis. CHEST: MEDIASTINUM: Cardiac enlargement. Status post CABG. No pericardial effusion. No mediastinal lymphadenopathy. LUNGS AND PLEURA: Atelectasis versus scar within the inferior lingula. No focal consolidation or pulmonary edema. No evidence of pleural effusion or pneumothorax. THORACIC BONES AND SOFT TISSUES: Status post median sternotomy. No acute osseous findings. No acute soft tissue abnormality. ABDOMEN AND PELVIS: LIVER: The liver is unremarkable. GALLBLADDER AND BILE DUCTS: Gallstones. Stone within the cystic duct is identified measuring 5 mm. There are several calcifications identified along the course of the nondilated common bile duct within the head of pancreas, image 98/13, suspicious for choledocholithiasis. SPLEEN: The spleen is unremarkable. PANCREAS: The pancreas is unremarkable. ADRENAL GLANDS: Bilateral adrenal glands demonstrate no acute abnormality. KIDNEYS, URETERS AND BLADDER: Simple cyst arising off the medial cortex of right kidney measures 1.7 cm, image 162/11. No stones in the kidneys or ureters. No hydronephrosis. No perinephric or periureteral stranding. Urinary bladder is unremarkable. GI AND BOWEL: Stomach and duodenal sweep demonstrate no acute abnormality. There is no bowel obstruction. No abnormal bowel wall thickening  or distension. REPRODUCTIVE: Prostate gland enlargement with multiple brachytherapy seeds noted. PERITONEUM AND RETROPERITONEUM: No ascites or free air. No free fluid or fluid collections. LYMPH NODES: No lymphadenopathy. ABDOMINAL BONES AND SOFT TISSUES: 6.1 cm fat containing umbilical hernia. No acute abnormality of the bones. No acute soft tissue abnormality. IMPRESSION: 1. No evidence of acute aortic syndrome. 2. Infrarenal abdominal aortic aneurysm measuring 5.2 cm with calcified plaque. According to consensus criteria, vascular consultation is advised as well as follow-up imaging every 6 months. 3. Gallstones with a 5 mm cystic duct stone and additional calcifications along the nondilated common bile duct suspicious for choledocholithiasis. 4. 6.1 cm fat-containing umbilical hernia. 5. Dilated main pulmonary artery concerning for pulmonary artery hypertension. Electronically signed by: Waddell Calk MD 11/13/2023 07:08 AM EDT RP Workstation: [id number]     Subjective: No acute issues or events overnight denies nausea vomiting diarrhea constipation any fevers chills chest pain.  Tolerating p.o. quite well.   Discharge Exam: Vitals:   11/17/23 0202 11/17/23 0642  BP: 113/60 117/70  Pulse: (!) 48 (!) 50  Resp: 18 16  Temp: 98 F (36.7 C) 98 F (36.7 C)  SpO2: 98% 99%   Vitals:   11/16/23 1745 11/16/23 2151 11/17/23 0202 11/17/23 0642  BP: 130/72 112/60 113/60 117/70  Pulse: 74 (!) 51 (!) 48 (!) 50  Resp: 18 18 18 16   Temp: 97.9 F (36.6 C) 98.2 F (36.8 C) 98 F (36.7 C) 98 F (36.7 C)  TempSrc: Oral Oral    SpO2: 100% 97% 98% 99%  Weight:      Height:        General: Pt is alert, awake, not in acute distress Cardiovascular: RRR, S1/S2 +, no rubs, no gallops Respiratory: CTA bilaterally, no wheezing, no rhonchi Abdominal: Soft, NT, ND, bowel sounds + Extremities: no edema, no cyanosis  The results of significant diagnostics from this hospitalization (including imaging,  microbiology, ancillary and laboratory) are listed below for reference.     Microbiology: No results found for this or any previous visit (from the past 240 hours).   Labs: BNP (last 3 results) No results for input(s): BNP in the last 8760 hours. Basic Metabolic Panel: Recent Labs  Lab 11/13/23 0431 11/14/23 0423 11/15/23 0516 11/15/23 0739 11/16/23 0438  NA 133* 135  --  137 136  K 3.8 3.7  --  3.4* 4.4  CL 98 98  --  99 100  CO2 24 25  --  26 22  GLUCOSE 147* 131*  --  99 169*  BUN 16 10  --  14 16  CREATININE 0.81 0.85  --  1.00 0.96  CALCIUM  9.5 9.3  --  9.2 9.0  MG  --   --  2.5*  --  2.5*  PHOS  --   --  2.6  --   --    Liver Function Tests: Recent Labs  Lab 11/13/23 0431 11/14/23 0423 11/15/23 0739 11/16/23 0438  AST 23 20 18 23   ALT 14 14 12 14   ALKPHOS 77 69 68 69  BILITOT 1.5* 3.9* 4.5* 2.7*  PROT 7.1 6.9 6.5 6.1*  ALBUMIN 4.3 4.1 3.8 3.8   Recent Labs  Lab 11/13/23 0431  LIPASE 25   No results for input(s): AMMONIA in the last 168 hours. CBC: Recent Labs  Lab 11/13/23 0431 11/14/23 0423 11/15/23 0739 11/16/23 0438  WBC 14.3* 13.9* 12.8* 10.1  NEUTROABS  --   --  10.4* 9.4*  HGB 13.9 13.6 13.1 13.1  HCT 42.7 40.3 39.6 39.3  MCV 85.9 87.4 87.6 86.4  PLT 170 153 164 175   Cardiac Enzymes: No results for input(s): CKTOTAL, CKMB, CKMBINDEX, TROPONINI in the last 168 hours. BNP: Invalid input(s): POCBNP CBG: No results for input(s): GLUCAP in the last 168 hours. D-Dimer No results for input(s): DDIMER in the last 72 hours. Hgb A1c No results for input(s): HGBA1C in the last 72 hours. Lipid Profile Recent Labs    11/15/23 0516  CHOL 82  HDL 27*  LDLCALC 36  TRIG 95  CHOLHDL 3.1   Thyroid function studies No results for input(s): TSH, T4TOTAL, T3FREE, THYROIDAB in the last 72 hours.  Invalid input(s): FREET3 Anemia work up No results for input(s): VITAMINB12, FOLATE, FERRITIN, TIBC, IRON,  RETICCTPCT in the last 72 hours. Urinalysis    Component Value Date/Time   COLORURINE YELLOW 11/13/2023 0431   APPEARANCEUR HAZY (A) 11/13/2023 0431   LABSPEC 1.030 11/13/2023 0431   PHURINE 5.0 11/13/2023 0431   GLUCOSEU NEGATIVE 11/13/2023 0431   HGBUR NEGATIVE 11/13/2023 0431   BILIRUBINUR NEGATIVE 11/13/2023 0431   KETONESUR NEGATIVE 11/13/2023 0431   PROTEINUR NEGATIVE 11/13/2023 0431   UROBILINOGEN 1.0 09/18/2010 1109   NITRITE NEGATIVE 11/13/2023 0431   LEUKOCYTESUR NEGATIVE 11/13/2023 0431   Sepsis Labs Recent Labs  Lab 11/13/23 0431 11/14/23 0423 11/15/23 0739 11/16/23 0438  WBC 14.3* 13.9* 12.8* 10.1   Microbiology No results found for this or any previous visit (from the past 240 hours).   Time coordinating discharge: Over 30 minutes  SIGNED:   Elsie JAYSON Montclair, DO [facility] 11/17/2023, 7:46 AM Pager   If 7PM-7AM, please contact night-coverage [email or web address]

## 2023-11-17 NOTE — Plan of Care (Signed)
 ?  Problem: Clinical Measurements: ?Goal: Ability to maintain clinical measurements within normal limits will improve ?Outcome: Progressing ?Goal: Will remain free from infection ?Outcome: Progressing ?Goal: Diagnostic test results will improve ?Outcome: Progressing ?  ?

## 2023-11-17 NOTE — Care Management Important Message (Signed)
 Important Message  Patient Details IM Letter given. Name: Jacob Small MRN: [id number] Date of Birth: 1955-09-05   Important Message Given:  Yes - Medicare IM     Melba Ates 11/17/2023, 10:01 AM

## 2023-11-17 NOTE — Progress Notes (Signed)
 Progress Note  1 Day Post-Op  Subjective: Tolerating diet, pain well controlled. Ready to go home. Wife at bedside as well this AM.   Objective: Vital signs in last 24 hours: Temp:  [97.4 F (36.3 C)-98.7 F (37.1 C)] 98 F (36.7 C) (10/02 0642) Pulse Rate:  [46-74] 50 (10/02 0642) Resp:  [11-25] 16 (10/02 0642) BP: (109-134)/(60-76) 117/70 (10/02 0642) SpO2:  [96 %-100 %] 99 % (10/02 0642) Last BM Date : 11/15/23  Intake/Output from previous day: 10/01 0701 - 10/02 0700 In: 2632.7 [P.O.:490; I.V.:1992.7; IV Piggyback:149.9] Out: 280 [Urine:250; Blood:30] Intake/Output this shift: No intake/output data recorded.  PE: General: pleasant, WD, obese male who is laying in bed in NAD HEENT: sclera anicteric Heart: regular, rate, and rhythm.   Lungs: Respiratory effort nonlabored Abd: soft, appropriately ttp, incisions C/D/I, soft reducible umbilical hernia  Psych: A&Ox3 with an appropriate affect.    Lab Results:  Recent Labs    11/15/23 0739 11/16/23 0438  WBC 12.8* 10.1  HGB 13.1 13.1  HCT 39.6 39.3  PLT 164 175   BMET Recent Labs    11/15/23 0739 11/16/23 0438  NA 137 136  K 3.4* 4.4  CL 99 100  CO2 26 22  GLUCOSE 99 169*  BUN 14 16  CREATININE 1.00 0.96  CALCIUM  9.2 9.0   PT/INR No results for input(s): LABPROT, INR in the last 72 hours. CMP     Component Value Date/Time   NA 136 11/16/2023 0438   NA 139 09/20/2023 0858   K 4.4 11/16/2023 0438   CL 100 11/16/2023 0438   CO2 22 11/16/2023 0438   GLUCOSE 169 (H) 11/16/2023 0438   BUN 16 11/16/2023 0438   BUN 16 09/20/2023 0858   CREATININE 0.96 11/16/2023 0438   CALCIUM  9.0 11/16/2023 0438   PROT 6.1 (L) 11/16/2023 0438   PROT 6.4 06/17/2023 0843   ALBUMIN 3.8 11/16/2023 0438   ALBUMIN 4.2 06/17/2023 0843   AST 23 11/16/2023 0438   ALT 14 11/16/2023 0438   ALKPHOS 69 11/16/2023 0438   BILITOT 2.7 (H) 11/16/2023 0438   BILITOT 1.8 (H) 06/17/2023 0843   GFRNONAA >60 11/16/2023 0438    GFRAA 96 08/29/2019 0841   Lipase     Component Value Date/Time   LIPASE 25 11/13/2023 0431       Studies/Results: DG ERCP Result Date: 11/16/2023 CLINICAL DATA:  [id number] Surgery, elective [id number] ERCP with stone removal EXAM: ERCP COMPARISON:  CTA CAP, 11/13/2023.  MRCP, 11/05/2023. FLUOROSCOPY: Exposure Index (as provided by the fluoroscopic device): 225.3 mGy Kerma FINDINGS: Limited oblique planar images of the RIGHT upper quadrant obtained C-arm. Images demonstrating flexible endoscopy, biliary duct cannulation, sphincterotomy, retrograde cholangiogram and balloon sweep. No biliary ductal dilation. No discrete biliary filling defect is demonstrated. IMPRESSION: Fluoroscopic imaging for ERCP. For complete description of intra procedural findings, please see performing service dictation. Electronically Signed   By: Thom Hall M.D.   On: 11/16/2023 08:44   DG C-Arm 1-60 Min-No Report Result Date: 11/15/2023 Fluoroscopy was utilized by the requesting physician.  No radiographic interpretation.    Anti-infectives: Anti-infectives (From admission, onward)    Start     Dose/Rate Route Frequency Ordered Stop   11/14/23 0830  piperacillin-tazobactam (ZOSYN) IVPB 3.375 g        3.375 g 12.5 mL/hr over 240 Minutes Intravenous Every 8 hours 11/14/23 0812     11/13/23 0730  piperacillin-tazobactam (ZOSYN) IVPB 3.375 g  3.375 g 100 mL/hr over 30 Minutes Intravenous  Once 11/13/23 [id number] 11/13/23 0800        Assessment/Plan  Choledocholithiasis with cholecystitis  POD1 s/p laparoscopic cholecystectomy - s/p ERCP 9/30 - pain well controlled and tolerating PO - reviewed post-op care with patient and wife - stable for DC from surgical standpoint, no further abx needed  - can discuss elective umbilical hernia repair once further out from gallbladder surgery   FEN: reg VTE: ok to resume eliquis  ID: [email or web address] can stop today     LOS: 4 days     Burnard JONELLE Louder, [facility] 11/17/2023, 9:48 AM Please see Amion for pager number during day hours 7:00am-4:30pm

## 2023-11-25 ENCOUNTER — Telehealth: Payer: Self-pay

## 2023-11-25 NOTE — Telephone Encounter (Signed)
 The patient called after recent hospitalization for clarification. He is most concerned about infrarenal aortic dilatation.  Reiterated that Dr. Sheree was consulted during hospitalization and cleared him for surgery. Informed him he is scheduled for 6 month US  and visit with Dr. Sheree February 2026.  Scheduled the patient for general Cardiology follow-up/check-up after hospitalization on 11/7 with Glendia Ferrier. At that time, the patient wishes to be reestablished with another Cardiologist.   The patient was grateful for call and agreed with plan.

## 2023-12-13 NOTE — Progress Notes (Signed)
   PROVIDER:  PUJA GOSAI MACZIS, PA  MRN: [id number] DOB: 09-Dec-1955 DATE OF ENCOUNTER: 12/13/2023 History of Present Illness:   Jacob Small is a 68 y.o. 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 Eletha.  Discharged postop day 1.  Denies abdominal pain and is not taking narcotic or OTC pain medication.  Tolerating diet well and denies diarrhea and constipation.  Denies issues with urination.  Denies fever, nausea, or vomiting.    Of note, he states about 3 weeks prior to his gallbladder flare his umbilicus developed a bulge when coughing.  It was painful for a few days but it no longer bothers him.  He is interested in getting the hernia repaired.  He was advised to have a 52-month duplex to reevaluate the aneurysm.  Review of Systems:   A complete review of systems was obtained from the patient.  I have reviewed this information and discussed as appropriate with the patient.  See HPI as well for other ROS.  ROS All other review of systems negative.  Medications:   Current Outpatient Medications on File Prior to Visit  Medication Sig Dispense Refill  . amLODIPine  (NORVASC ) 5 MG tablet Take 5 mg by mouth once daily    . apixaban  (ELIQUIS ) 5 mg tablet Take 5 mg by mouth 2 (two) times daily    . ciprofloxacin  HCl (CILOXAN ) 0.3 % ophthalmic solution Ophthalmic drop for otic use.  Apply 4 drops to the affected ear 2x/day for 7 days.    . losartan -hydroCHLOROthiazide  (HYZAAR) 100-25 mg tablet Take 1 tablet by mouth once daily    . rosuvastatin  (CRESTOR ) 5 MG tablet Take 5 mg by mouth 3 (three) times a week    . tamsulosin (FLOMAX) 0.4 mg capsule Take 0.4 mg by mouth once daily     No current facility-administered medications on file prior to visit.    Physical Examination:   BP (!) 143/79   Pulse 74   Temp 36.7 C (98 F)   Ht 188 cm (6' 2)   Wt (!) 140.6 kg (310 lb)   SpO2 99%    BMI 39.80 kg/m  General: Alert, oriented, in no acute distress Pulmonary: Effort normal Abdomen: Soft, non-tender, non-distended abdomen. Incisions clean, dry, and intact without surrounding erythema and drainage.  Large umbilical hernia  Assessment and Plan:   Diagnoses and all orders for this visit:  Status post laparoscopic cholecystectomy    [name] is doing well and healing appropriately without signs of infection.  Pathology report reviewed with patient.  We discussed gradually increasing activities as tolerated.    He will follow-up with a surgeon to discuss possibly repairing his umbilical hernia.  We discussed signs of incarceration/strangulation.  Follow-up as needed.   Puja Maczis, [facility]

## 2023-12-22 ENCOUNTER — Encounter: Payer: Self-pay | Admitting: Physician Assistant

## 2023-12-22 DIAGNOSIS — I358 Other nonrheumatic aortic valve disorders: Secondary | ICD-10-CM | POA: Insufficient documentation

## 2023-12-22 NOTE — Progress Notes (Signed)
 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 Sheree; f/u 03/2024 Hypertension Hyperlipidemia LP(a) 27.2 Chronic kidney disease  Umbilical hernia   S/p cholecystectomy 11/2023        Discussed the use of AI scribe software for clinical note transcription with the patient, who gave verbal consent to proceed. History of Present Illness Jacob Small is a 68 y.o. male for post hospital follow up. Admitted 9/28-10/2 with acute cholecystitis in the setting of choledocholithiasis.  He underwent laparoscopic cholecystectomy 11/16/2023.  CT demonstrated incidental 5.2 cm infrarenal abdominal aortic aneurysm.  He was seen by Dr. Sheree with vascular surgery.  Plan is to follow-up in 6 months as an outpatient.   Jacob Small is a 68 year old male with coronary artery disease and atrial fibrillation who presents for follow-up after recent hospitalization for acute cholecystitis and choledocholithiasis.  He has a history of coronary artery disease and underwent coronary artery bypass grafting in August 2012. He continues to take Crestor  5 mg three times a week.  He has persistent atrial fibrillation and underwent cardioversion in March 2025. He continues on carvedilol  6.25 mg twice daily and Eliquis  5 mg twice daily. He was evaluated for left atrial  appendage occlusion, but his anatomy was not adequate for the procedure.  He has a thoracic aortic aneurysm and underwent valve-sparing aortic root replacement in August 2012. A cardiac CT in August 2025 showed a stable aortic root. He also has an abdominal aortic aneurysm measuring 5.2 cm, identified on a CT scan in September 2025.  He was recently hospitalized from September 28 to November 17, 2023, for acute cholecystitis and choledocholithiasis and underwent a laparoscopic cholecystectomy. During this hospitalization, an infrarenal abdominal aortic aneurysm was noted.  He has hypertension and continues on amlodipine  5 mg daily, carvedilol  6.25 mg twice daily, and losartan /HCTZ 100/25 mg daily. He reports no dizziness or lightheadedness.  He has hyperlipidemia. He continues on Crestor  5 mg three times a week.  He has chronic kidney disease.  He reports dietary changes following his gallbladder surgery, noting that certain foods, particularly Mexican food, cause gastrointestinal discomfort. He has lost 24 pounds since his hospitalization.  No chest discomfort, unusual shortness of breath, heart palpitations, syncope, or leg swelling. He is compliant with his blood thinner medication but is disappointed about not being able to donate blood anymore.  He has a history of smoking but quit at age 82. He denies any current tobacco use.    Review of Systems  Gastrointestinal:  Negative for hematochezia and melena.  Genitourinary:  Negative for hematuria.  -See HPI    Studies Reviewed:       Labs 11/15/2023: Total cholesterol 82, HDL 27, LDL 36, triglycerides 95 11/16/2023: K 4.4, creatinine 0.96, magnesium 2.5, ALT 14, eGFR >60, hemoglobin 13.1, PLT 164K  Risk Assessment/Calculations:  CHA2DS2-VASc Score = 3   This indicates a 3.2% annual risk of stroke. The patient's score is based upon: CHF History: 0 HTN History: 1 Diabetes History: 0 Stroke History: 0 Vascular Disease History: 1 Age  Score: 1 Gender Score: 0            Physical Exam:  VS:  BP 124/78   Pulse (!) 48   Ht 6' 2 (1.88 m)   Wt (!) 304 lb 12.8 oz (138.3 kg)   SpO2 99%   BMI 39.13 kg/m        Wt Readings from Last 3 Encounters:  12/23/23 (!) 304 lb 12.8 oz (138.3 kg)  11/16/23 (!) 321 lb 14 oz (146 kg)  11/11/23 (!) 322 lb (146.1 kg)    Constitutional:      Appearance: Healthy appearance. Not in distress.  Neck:     Vascular: JVD normal.  Pulmonary:     Breath sounds: Normal breath sounds. No wheezing. No rales.  Cardiovascular:     Bradycardia present. Regular rhythm.     Murmurs: There is no murmur.  Edema:    Peripheral edema absent.  Abdominal:     Palpations: Abdomen is soft.       Assessment and Plan:    Assessment & Plan Coronary artery disease involving coronary bypass graft of native heart with angina pectoris Status post CABG in 2012.  He recently underwent surgery cholecystectomy without cardiac complications. He is doing well without angina.  He is not on antiplatelet therapy as he is Eliquis . - Continue Crestor  5 mg three times a week. - Follow up 6 mos  Persistent atrial fibrillation (HCC) Status post cardioversion in March 2025.  He was previously evaluated for left atrial appendage occlusion combined with PVI ablation.  However, the patient did not have suitable anatomy for left atrial appendage occlusion.  Decision has been made to hold off on ablation for now. Currently maintaining sinus rhythm. Long history of bradycardia, asymptomatic. Anatomy not adequate for left atrial appendage occlusion. PVI ablation on hold. - Continue carvedilol  6.25 mg twice daily. - Continue Eliquis  5 mg twice daily. - Follow up with EP as planned. Aortic valve sclerosis Mean gradient 10 on recent echocardiogram. Aneurysm of ascending aorta without rupture 5.2 cm by recent CT scan.  He has been seen by vascular surgery and has follow-up in 6 months. Pure hypercholesterolemia Lipid levels  well managed with current treatment. LDL at optimal levels. - Continue Crestor  5 mg three times a week. Infrarenal abdominal aortic aneurysm (AAA) without rupture Status post valve sparing aortic root replacement at the time of bypass in 2012. Cardiac CT in August 2025 showed stable aortic root. Primary hypertension Blood pressure well controlled on current regimen. - Continue amlodipine  5 mg daily. - Continue carvedilol  6.25 mg twice daily. - Continue losartan /HCTZ 100/25 mg daily.         Dispo:  Return in about 6 months (around 06/21/2024) for Routine Follow Up, w/ Dr. Kriste, or Glendia Ferrier, PA-C.  Signed, Glendia Ferrier, PA-C

## 2023-12-22 NOTE — Assessment & Plan Note (Signed)
 Status post cardioversion in March 2025.  He was previously evaluated for left atrial appendage occlusion combined with PVI ablation.  However, the patient did not have suitable anatomy for left atrial appendage occlusion.  Decision has been made to hold off on ablation for now.***

## 2023-12-22 NOTE — Assessment & Plan Note (Signed)
 Status post CABG in 2012.  He recently underwent surgery cholecystectomy without cardiac complications.***

## 2023-12-22 NOTE — Assessment & Plan Note (Signed)
 Mean gradient 10 on recent echocardiogram.***

## 2023-12-22 NOTE — Assessment & Plan Note (Signed)
 5.2 cm by recent CT scan.  He has been seen by vascular surgery and has follow-up in 6 months.

## 2023-12-22 NOTE — Assessment & Plan Note (Signed)
 Status post valve sparing aortic root replacement at the time of bypass in 2012.***

## 2023-12-23 ENCOUNTER — Encounter: Payer: Self-pay | Admitting: Physician Assistant

## 2023-12-23 ENCOUNTER — Ambulatory Visit: Attending: Physician Assistant | Admitting: Physician Assistant

## 2023-12-23 VITALS — BP 124/78 | HR 48 | Ht 74.0 in | Wt 304.8 lb

## 2023-12-23 DIAGNOSIS — E7800 Pure hypercholesterolemia, unspecified: Secondary | ICD-10-CM

## 2023-12-23 DIAGNOSIS — I10 Essential (primary) hypertension: Secondary | ICD-10-CM

## 2023-12-23 DIAGNOSIS — I358 Other nonrheumatic aortic valve disorders: Secondary | ICD-10-CM | POA: Diagnosis not present

## 2023-12-23 DIAGNOSIS — I4819 Other persistent atrial fibrillation: Secondary | ICD-10-CM

## 2023-12-23 DIAGNOSIS — I25709 Atherosclerosis of coronary artery bypass graft(s), unspecified, with unspecified angina pectoris: Secondary | ICD-10-CM | POA: Diagnosis not present

## 2023-12-23 DIAGNOSIS — I7143 Infrarenal abdominal aortic aneurysm, without rupture: Secondary | ICD-10-CM

## 2023-12-23 DIAGNOSIS — I7121 Aneurysm of the ascending aorta, without rupture: Secondary | ICD-10-CM | POA: Diagnosis not present

## 2023-12-23 NOTE — Patient Instructions (Signed)
 Medication Instructions:  No changes  *If you need a refill on your cardiac medications before your next appointment, please call your pharmacy*   Follow-Up: At [facility], you and your health needs are our priority.  As part of our continuing mission to provide you with exceptional heart care, our providers are all part of one team.  This team includes your primary Cardiologist (physician) and Advanced Practice Providers or APPs (Physician Assistants and Nurse Practitioners) who all work together to provide you with the care you need, when you need it.  Your next appointment:   6 month(s)  Provider:   Emeline FORBES Calender, MD or Glendia Ferrier, PA-C          We recommend signing up for the patient portal called MyChart.  Sign up information is provided on this After Visit Summary.  MyChart is used to connect with patients for Virtual Visits (Telemedicine).  Patients are able to view lab/test results, encounter notes, upcoming appointments, etc.  Non-urgent messages can be sent to your provider as well.   To learn more about what you can do with MyChart, go to [email or web address].   Other Instructions You will need to avoid Fluoroquinolones because of your aneurysm. An example is Cipro . So, don't let anyone put you on an antibiotic like Cipro .

## 2023-12-23 NOTE — Assessment & Plan Note (Signed)
 Blood pressure well controlled on current regimen. - Continue amlodipine  5 mg daily. - Continue carvedilol  6.25 mg twice daily. - Continue losartan /HCTZ 100/25 mg daily.

## 2023-12-23 NOTE — Assessment & Plan Note (Signed)
 Lipid levels well managed with current treatment. LDL at optimal levels. - Continue Crestor  5 mg three times a week.

## 2023-12-27 ENCOUNTER — Telehealth (HOSPITAL_BASED_OUTPATIENT_CLINIC_OR_DEPARTMENT_OTHER): Payer: Self-pay

## 2023-12-27 ENCOUNTER — Ambulatory Visit: Payer: Self-pay | Admitting: General Surgery

## 2023-12-27 NOTE — Telephone Encounter (Signed)
 Pt recently admitted for cholecystitis and underwent cholecystectomy w/o cardiac complications. Pt was doing well at last visit without unstable cardiac symptoms. Therefore, he may proceed with his hernia surgery at acceptable CV risk. Glendia Ferrier, PA-C    12/27/2023 3:58 PM

## 2023-12-27 NOTE — Telephone Encounter (Signed)
   Pre-operative Risk Assessment    Patient Name: MCCLAIN SHALL  DOB: April 03, 1955 MRN: [id number]   Date of last office visit: 12/23/23 with Lelon Date of next office visit: NA   Request for Surgical Clearance    Procedure:  Hernia surgery  Date of Surgery:  Clearance TBD                                 Surgeon:  Dr. Deward Null Surgeon's Group or Practice Name:  [facility] Phone number:  [phone] Fax number:  [phone] - Rosaline Sprang   Type of Clearance Requested:   - Medical  - Pharmacy:  Hold Apixaban  (Eliquis ) not indicated   Type of Anesthesia:  General    Additional requests/questions:    Bonney Augustin JONETTA Delores   12/27/2023, 11:47 AM

## 2023-12-27 NOTE — Telephone Encounter (Signed)
 Jacob Small, you recently saw this pt in clinic. Are you able to comment on surgical clearance for upcoming hernia surgery scheduled for TBD? Please route your response to P CV DIV PREOP. Thank you!

## 2023-12-27 NOTE — Telephone Encounter (Signed)
Pharmacy please advise on holding Eliquis prior to hernia surgery scheduled for TBD. Thank you.

## 2023-12-28 NOTE — Telephone Encounter (Signed)
 Patient with diagnosis of atrial fibrillation on Eliquis  for anticoagulation.    Procedure:  Hernia surgery   Date of Surgery:  Clearance TBD     CHA2DS2-VASc Score = 3   This indicates a 3.2% annual risk of stroke. The patient's score is based upon: CHF History: 0 HTN History: 1 Diabetes History: 0 Stroke History: 0 Vascular Disease History: 1 Age Score: 1 Gender Score: 0   CrCl 107 ml/min Platelet count 175 K  Patient has not had an Afib/aflutter ablation in the last 3 months, DCCV within the last 4 weeks or a watchman implanted in the last 45 days   Last cardioversion - 05/09/2023 Per most recent cardiology note (12/2023), PVI ablation on hold for now  Per office protocol, patient can hold Eliquis  for 2 days prior to procedure.   Patient will not need bridging with Lovenox (enoxaparin) around procedure.  **This guidance is not considered finalized until pre-operative APP has relayed final recommendations.**

## 2023-12-29 NOTE — Telephone Encounter (Signed)
     Primary Cardiologist: Emeline FORBES Calender, MD  Chart reviewed as part of pre-operative protocol coverage. Given past medical history and time since last visit, based on ACC/AHA guidelines, Jacob Small would be at acceptable risk for the planned procedure without further cardiovascular testing.   Patient has not had an Afib/aflutter ablation in the last 3 months, DCCV within the last 4 weeks or a watchman implanted in the last 45 days    Last cardioversion - 05/09/2023 Per most recent cardiology note (12/2023), PVI ablation on hold for now   Per office protocol, patient can hold Eliquis  for 2 days prior to procedure.   Patient will not need bridging with Lovenox (enoxaparin) around procedure.  I will route this recommendation to the requesting party via Epic fax function and remove from pre-op pool.  Josefa HERO. Hailie Searight NP-C     12/29/2023, 11:15 AM [facility] [address] [place], [place] [address] Office [phone]

## 2023-12-30 ENCOUNTER — Other Ambulatory Visit: Payer: Self-pay | Admitting: Internal Medicine

## 2023-12-30 MED ORDER — ROSUVASTATIN CALCIUM 5 MG PO TABS: ORAL_TABLET | ORAL | 3 refills | Status: AC

## 2024-01-18 NOTE — Progress Notes (Signed)
 Surgical Instructions   Your procedure is scheduled on Monday, December 8th. Report to [facility] Main Entrance A at 11:00 A.M., then check in with the Admitting office. Any questions or running late day of surgery: call [phone]  Questions prior to your surgery date: call [phone], Monday-Friday, 8am-4pm. If you experience any cold or flu symptoms such as cough, fever, chills, shortness of breath, etc. between now and your scheduled surgery, please notify us  at the above number.     Remember:  Do not eat after midnight the night before your surgery   You may drink clear liquids until 10:00 the morning of your surgery.   Clear liquids allowed are: Water, Non-Citrus Juices (without pulp), Carbonated Beverages, Clear Tea (no milk, honey, etc.), Black Coffee Only (NO MILK, CREAM OR POWDERED CREAMER of any kind), and Gatorade.    Take these medicines the morning of surgery with A SIP OF WATER  amLODipine  (NORVASC )  carvedilol  (COREG )    Per your cardiologist's instruction, Hold your apixaban  (ELIQUIS ) for 2 day's prior to surgery. Last dose Friday, Dec. 5th.   One week prior to surgery, STOP taking any Aspirin  (unless otherwise instructed by your surgeon) Aleve, Naproxen, Ibuprofen, Motrin, Advil, Goody's, BC's, all herbal medications, fish oil, and non-prescription vitamins.                     Do NOT Smoke (Tobacco/Vaping) for 24 hours prior to your procedure.  If you use a CPAP at night, you may bring your mask/headgear for your overnight stay.   You will be asked to remove any contacts, glasses, piercing's, hearing aid's, dentures/partials prior to surgery. Please bring cases for these items if needed.    Patients discharged the day of surgery will not be allowed to drive home, and someone needs to stay with them for 24 hours.  SURGICAL WAITING ROOM VISITATION Patients may have no more than 2 support people in the waiting area - these visitors may rotate.   Pre-op  nurse will coordinate an appropriate time for 1 ADULT support person, who may not rotate, to accompany patient in pre-op.  Children under the age of 17 must have an adult with them who is not the patient and must remain in the main waiting area with an adult.  If the patient needs to stay at the hospital during part of their recovery, the visitor guidelines for inpatient rooms apply.  Please refer to the [facility] website for the visitor guidelines for any additional information.   If you received a COVID test during your pre-op visit  it is requested that you wear a mask when out in public, stay away from anyone that may not be feeling well and notify your surgeon if you develop symptoms. If you have been in contact with anyone that has tested positive in the last 10 days please notify you surgeon.      Pre-operative CHG Bathing Instructions   You can play a key role in reducing the risk of infection after surgery. Your skin needs to be as free of germs as possible. You can reduce the number of germs on your skin by washing with CHG (chlorhexidine  gluconate) soap before surgery. CHG is an antiseptic soap that kills germs and continues to kill germs even after washing.   DO NOT use if you have an allergy to chlorhexidine /CHG or antibacterial soaps. If your skin becomes reddened or irritated, stop using the CHG and notify one of our RNs at  [phone].              TAKE A SHOWER THE NIGHT BEFORE SURGERY   Please keep in mind the following:  DO NOT shave, including legs and underarms, 48 hours prior to surgery.   You may shave your face before/day of surgery.  Place clean sheets on your bed the night before surgery Use a clean washcloth (not used since being washed) for shower. DO NOT sleep with pet's night before surgery.  CHG Shower Instructions:  Wash your face and private area with normal soap. If you choose to wash your hair, wash first with your normal shampoo.  After you use  shampoo/soap, rinse your hair and body thoroughly to remove shampoo/soap residue.  Turn the water OFF and apply half the bottle of CHG soap to a CLEAN washcloth.  Apply CHG soap ONLY FROM YOUR NECK DOWN TO YOUR TOES (washing for 3-5 minutes)  DO NOT use CHG soap on face, private areas, open wounds, or sores.  Pay special attention to the area where your surgery is being performed.  If you are having back surgery, having someone wash your back for you may be helpful. Wait 2 minutes after CHG soap is applied, then you may rinse off the CHG soap.  Pat dry with a clean towel  Put on clean pajamas    Additional instructions for the day of surgery: If you choose, you may shower the morning of surgery with an antibacterial soap.  DO NOT APPLY any lotions, deodorants, or cologne.   Do not wear jewelry Do not bring valuables to the hospital. [facility] is not responsible for valuables/personal belongings. Put on clean/comfortable clothes.  Please brush your teeth.  Ask your nurse before applying any prescription medications to the skin.

## 2024-01-19 ENCOUNTER — Encounter (HOSPITAL_COMMUNITY): Payer: Self-pay

## 2024-01-19 ENCOUNTER — Other Ambulatory Visit: Payer: Self-pay

## 2024-01-19 ENCOUNTER — Inpatient Hospital Stay (HOSPITAL_COMMUNITY): Admission: RE | Admit: 2024-01-19 | Discharge: 2024-01-19 | Attending: General Surgery

## 2024-01-19 VITALS — BP 144/81 | HR 56 | Temp 98.2°F | Resp 20 | Ht 74.0 in | Wt 307.3 lb

## 2024-01-19 DIAGNOSIS — Z01818 Encounter for other preprocedural examination: Secondary | ICD-10-CM

## 2024-01-19 HISTORY — DX: Cardiac arrhythmia, unspecified: I49.9

## 2024-01-19 HISTORY — DX: Other complications of anesthesia, initial encounter: T88.59XA

## 2024-01-19 LAB — COMPREHENSIVE METABOLIC PANEL WITH GFR
ALT: 19 U/L (ref 0–44)
AST: 16 U/L (ref 15–41)
Albumin: 3.7 g/dL (ref 3.5–5.0)
Alkaline Phosphatase: 79 U/L (ref 38–126)
Anion gap: 7 (ref 5–15)
BUN: 13 mg/dL (ref 8–23)
CO2: 30 mmol/L (ref 22–32)
Calcium: 9 mg/dL (ref 8.9–10.3)
Chloride: 100 mmol/L (ref 98–111)
Creatinine, Ser: 0.78 mg/dL (ref 0.61–1.24)
GFR, Estimated: >60 mL/min (ref >60)
Glucose, Bld: 98 mg/dL (ref 70–99)
Potassium: 3.8 mmol/L (ref 3.5–5.1)
Sodium: 137 mmol/L (ref 135–145)
Total Bilirubin: 2.5 mg/dL — ABNORMAL HIGH (ref 0.0–1.2)
Total Protein: 6.6 g/dL (ref 6.5–8.1)

## 2024-01-19 LAB — CBC
HCT: 43 % (ref 39.0–52.0)
Hemoglobin: 14.6 g/dL (ref 13.0–17.0)
MCH: 28.6 pg (ref 26.0–34.0)
MCHC: 34 g/dL (ref 30.0–36.0)
MCV: 84.3 fL (ref 80.0–100.0)
Platelets: 154 K/uL (ref 150–400)
RBC: 5.1 MIL/uL (ref 4.22–5.81)
RDW: 13 % (ref 11.5–15.5)
WBC: 8.3 K/uL (ref 4.0–10.5)
nRBC: 0 % (ref 0.0–0.2)

## 2024-01-19 NOTE — Progress Notes (Signed)
 PCP - Dr. Milo Costa Cardiologist - Glendia Ferrier, PA-C, LOV 12/29/2023, Clearance EP Cardiologist: Dr. Ole Holts  PPM/ICD - denies Device Orders - na Rep Notified - na  Chest x-ray - na EKG - 11/13/2023 Stress Test - 2014 ECHO - 09/22/2023 Cardiac Cath -  CABG x 2: 2012  Sleep Study - had sleep study 10 years ago, no diagnosis of sleep apnea CPAP - na  Non-diabetic  Blood Thinner Instructions:Eliquis , hold 2 days Aspirin  Instructions: denies  ERAS Protcol - Clears until 1000  Anesthesia review: Yes.  Cardiac clearance, HTN, CAD, MI, aortic aneurysm  Patient denies shortness of breath, fever, cough and chest pain at PAT appointment   All instructions explained to the patient, with a verbal understanding of the material. Patient agrees to go over the instructions while at home for a better understanding. Patient also instructed to self quarantine after being tested for COVID-19. The opportunity to ask questions was provided.

## 2024-01-19 NOTE — Progress Notes (Signed)
 Surgical Instructions   Your procedure is scheduled on Monday, December 8th. Report to [facility] Main Entrance A at 11:00 A.M., then check in with the Admitting office. Any questions or running late day of surgery: call [phone]  Questions prior to your surgery date: call [phone], Monday-Friday, 8am-4pm. If you experience any cold or flu symptoms such as cough, fever, chills, shortness of breath, etc. between now and your scheduled surgery, please notify us  at the above number.     Remember:  Do not eat after midnight the night before your surgery   You may drink clear liquids until 10:00 the morning of your surgery.   Clear liquids allowed are: Water, Non-Citrus Juices (without pulp), Carbonated Beverages, Clear Tea (no milk, honey, etc.), Black Coffee Only (NO MILK, CREAM OR POWDERED CREAMER of any kind), and Gatorade.    Take these medicines the morning of surgery with A SIP OF WATER  amLODipine  (NORVASC )  carvedilol  (COREG )  tamsulosin  (FLOMAX )   Per your cardiologist's instruction, Hold your apixaban  (ELIQUIS ) for 2 day's prior to surgery. Last dose Friday, Dec. 5th.   One week prior to surgery, STOP taking any Aspirin  (unless otherwise instructed by your surgeon) Aleve, Naproxen, Ibuprofen, Motrin, Advil, Goody's, BC's, all herbal medications, fish oil, and non-prescription vitamins.                     Do NOT Smoke (Tobacco/Vaping) for 24 hours prior to your procedure.  If you use a CPAP at night, you may bring your mask/headgear for your overnight stay.   You will be asked to remove any contacts, glasses, piercing's, hearing aid's, dentures/partials prior to surgery. Please bring cases for these items if needed.    Patients discharged the day of surgery will not be allowed to drive home, and someone needs to stay with them for 24 hours.  SURGICAL WAITING ROOM VISITATION Patients may have no more than 2 support people in the waiting area - these visitors may  rotate.   Pre-op nurse will coordinate an appropriate time for 1 ADULT support person, who may not rotate, to accompany patient in pre-op.  Children under the age of 16 must have an adult with them who is not the patient and must remain in the main waiting area with an adult.  If the patient needs to stay at the hospital during part of their recovery, the visitor guidelines for inpatient rooms apply.  Please refer to the [facility] website for the visitor guidelines for any additional information.   If you received a COVID test during your pre-op visit  it is requested that you wear a mask when out in public, stay away from anyone that may not be feeling well and notify your surgeon if you develop symptoms. If you have been in contact with anyone that has tested positive in the last 10 days please notify you surgeon.      Pre-operative CHG Bathing Instructions   You can play a key role in reducing the risk of infection after surgery. Your skin needs to be as free of germs as possible. You can reduce the number of germs on your skin by washing with CHG (chlorhexidine  gluconate) soap before surgery. CHG is an antiseptic soap that kills germs and continues to kill germs even after washing.   DO NOT use if you have an allergy to chlorhexidine /CHG or antibacterial soaps. If your skin becomes reddened or irritated, stop using the CHG and notify one of our  RNs at [phone].              TAKE A SHOWER THE NIGHT BEFORE SURGERY   Please keep in mind the following:  You may shave your face before/day of surgery.  Place clean sheets on your bed the night before surgery Use a clean washcloth (not used since being washed) for shower. DO NOT sleep with pet's night before surgery.  CHG Shower Instructions:  Wash your face and private area with normal soap. If you choose to wash your hair, wash first with your normal shampoo.  After you use shampoo/soap, rinse your hair and body thoroughly to remove  shampoo/soap residue.  Turn the water OFF and apply half the bottle of CHG soap to a CLEAN washcloth.  Apply CHG soap ONLY FROM YOUR NECK DOWN TO YOUR TOES (washing for 3-5 minutes)  DO NOT use CHG soap on face, private areas, open wounds, or sores.  Pay special attention to the area where your surgery is being performed.  If you are having back surgery, having someone wash your back for you may be helpful. Wait 2 minutes after CHG soap is applied, then you may rinse off the CHG soap.  Pat dry with a clean towel  Put on clean pajamas    Additional instructions for the day of surgery: If you choose, you may shower the morning of surgery with an antibacterial soap.  DO NOT APPLY any lotions, deodorants, or cologne.   Do not wear jewelry Do not bring valuables to the hospital. [facility] is not responsible for valuables/personal belongings. Put on clean/comfortable clothes.  Please brush your teeth.  Ask your nurse before applying any prescription medications to the skin.

## 2024-01-20 NOTE — Anesthesia Preprocedure Evaluation (Signed)
 Anesthesia Evaluation    Airway        Dental   Pulmonary former smoker          Cardiovascular hypertension,      Neuro/Psych    GI/Hepatic   Endo/Other    Renal/GU      Musculoskeletal   Abdominal   Peds  Hematology   Anesthesia Other Findings   Reproductive/Obstetrics                              Anesthesia Physical Anesthesia Plan  ASA:   Anesthesia Plan:    Post-op Pain Management:    Induction:   PONV Risk Score and Plan:   Airway Management Planned:   Additional Equipment:   Intra-op Plan:   Post-operative Plan:   Informed Consent:   Plan Discussed with:   Anesthesia Plan Comments: (PAT note written 01/20/2024 by Rodolfo Gaster, PA-C. Reported violent behavior/postoperative delirium after ERCP. For cholecystectomy No Midazolam  was documented.   )         Anesthesia Quick Evaluation

## 2024-01-20 NOTE — Progress Notes (Signed)
 Anesthesia Chart Review:  Case: [id number] Date/Time: 01/23/24 1245   Procedure: REPAIR, HERNIA, UMBILICAL, LAPAROSCOPIC - LAPAROSCOPIC UMBILICAL, MESH   Anesthesia type: General   Diagnosis: Umbilical hernia without obstruction or gangrene [K42.9]   Pre-op diagnosis: UMBILICAL HERNIA   [facility] / [facility]   Surgeons: Curvin Deward MOULD, MD       DISCUSSION: Patient is a 68 year old male scheduled for the above procedure.  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 BMI is consistent with obesity.   TTE 09/22/2023 showed LVEF 60-65%, no RWMA, mild concentric LVH, GR 1 DD, normal RVSF, mild LAE, trivial MR, AV sclerosis, mean AV gradient 10.2.                Preoperative cardiology input outlined by Emelia Hazy, NP on 12/29/2023, Given past medical history and time since last visit, based on ACC/AHA guidelines, Jacob Small would be at acceptable risk for the planned procedure without further cardiovascular testing.    Patient has not had an Afib/aflutter ablation in the last 3 months, DCCV within the last 4 weeks or a watchman implanted in the last 45 days    Last cardioversion - 05/09/2023 Per most recent cardiology note (12/2023), PVI ablation on hold for now   Per office protocol, patient can hold Eliquis  for 2 days prior to procedure.   Patient will not need bridging with Lovenox  (enoxaparin ) around procedure.    Anesthesia team to evaluate on the day of surgery. Of note, for anesthesia history he reported, Gets violent; don't use anesthesia they used at [facility] for gallbladder stone removal.   VS: BP (!) 144/81   Pulse (!) 56   Temp 36.8 C (Oral)   Resp 20   Ht 6' 2 (1.88 m)   Wt (!) 139.4 kg   SpO2 98%   BMI 39.45 kg/m   PROVIDERS: Auston Opal, DO is PCP  Kriste Hicks, DO is cardiologist  Cindie Smalls, MD is EP Thaddeus Locus, MD is ENT. Last visit 01/06/2024, He is wearing binaural hearing aids. He is not quite a cochlear implant candidate based on current Medicare guidelines but may become one in the future.  He does not  want to have a cochlear implant at this time.    LABS: Labs reviewed: Acceptable for surgery. Total bili 2.5, but consistent with prior results over the past year (as high as 4.5 on 11/15/2023 when admitted for acute cholecystitis).  (all labs ordered are listed, but only abnormal results are displayed)  Labs Reviewed  COMPREHENSIVE METABOLIC PANEL WITH GFR - Abnormal; Notable for the following components:      Result Value   Total Bilirubin 2.5 (*)    All other components within normal limits  CBC     IMAGES: CTA Chest/abd/pelvis 11/13/2023: IMPRESSION: 1. No evidence of acute aortic syndrome. 2. Infrarenal abdominal aortic aneurysm measuring 5.2 cm with calcified plaque. According to consensus criteria, vascular consultation is advised as well as follow-up imaging every 6 months. 3. Gallstones with a 5 mm cystic duct stone  and additional calcifications along the nondilated common bile duct suspicious for choledocholithiasis. 4. 6.1 cm fat-containing umbilical hernia. 5. Dilated main pulmonary artery concerning for pulmonary artery hypertension.    EKG: 11/13/2023: Sinus rhythm  Nonspecific intraventricular conduction delay  Inferior infarct, age indeterminate  Consider anterior infarct  Confirmed by Griselda Norris [id number]) on 11/13/2023 7:08:40  AM   CV: CT Cardiac Morph/Pulm Vein 11/13/2023: IMPRESSION: 1. The left atrial appendage is a large chicken wing morphology with acute angulation creating an overall depth of 16 mm. 2. A 24 mm Watchman device is recommended with 15% compression. Alternatively, a 27 mm Watchman device with 24% compression could also be considered based on average landing zone diameter and maximum diameter. Watchman team discussion recommended. 3. There is no thrombus in the left atrial appendage. 4. An inferior posterior IAS puncture site is recommended. 5. Optimal deployment angle: RAO 4 CAU 9 6. Normal variant pulmonary vein anatomy with no pulmonary vein stenosis. Esophagus is adjacent to the left lower pulmonary vein.  Echo 09/22/2023: IMPRESSIONS   1. Left ventricular ejection fraction, by estimation, is 60 to 65%. The  left ventricle has normal function. The left ventricle has no regional  wall motion abnormalities. The left ventricular internal cavity size was  mildly dilated. There is mild  concentric left ventricular hypertrophy. Left ventricular diastolic  parameters are consistent with Grade I diastolic dysfunction (impaired  relaxation).   2. Right ventricular systolic function is normal. The right ventricular  size is normal.   3. Left atrial size was mildly dilated.   4. The mitral valve is normal in structure. Trivial mitral valve  regurgitation. No evidence of mitral stenosis.   5. The aortic valve was not well visualized. Aortic valve regurgitation  is not visualized. Aortic valve sclerosis/calcification is present,  without any evidence of aortic stenosis. Aortic valve mean gradient  measures 10.2 mmHg. Aortic valve Vmax  measures 2.21 m/s.   6. Aortic root/ascending aorta has been repaired/replaced.   7. The inferior vena cava is normal in size with greater than 50%  respiratory variability, suggesting right atrial pressure of 3 mmHg.    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)    Past Surgical History:  Procedure Laterality Date   AORTIC ROOT REPLACEMENT  09/22/2010   Median sternotomy for valve-sparing aortic root replacemnt Charolotte I reimplanation technique) with resection of engrafting of the ascending thoracic aorta .Partial circulatoru arrest, coronaru aartery bypass grafting X2 (left intrnal mammary artery to distal left anterior descending coronary artery, saphenous vein graft  to posterrior descending coronary artery, EVH rt thigh   CARDIOVERSION N/A 05/09/2023   Procedure: CARDIOVERSION;  Surgeon: Francyne Headland, MD;  Location: [facility] INVASIVE CV LAB;  Service: Cardiovascular;  Laterality: N/A;   CHOLECYSTECTOMY N/A 11/16/2023   Procedure: LAPAROSCOPIC CHOLECYSTECTOMY;  Surgeon: Eletha Boas, MD;  Location: [facility] ORS;  Service: General;  Laterality: N/A;   CORONARY ARTERY BYPASS GRAFT  09/22/2010   CABGx2 with LIMA to LAD, SVG to PDA   CORONARY ARTERY BYPASS GRAFT     CYSTOSCOPY WITH INSERTION OF UROLIFT N/A 03/01/2019   Procedure: CYSTOSCOPY WITH INSERTION OF UROLIFT;  Surgeon: Kassie Ozell SAUNDERS, MD;  Location: [facility] ORS;  Service: Urology;  Laterality: N/A;    ERCP N/A 11/15/2023   Procedure: ERCP, WITH INTERVENTION IF INDICATED;  Surgeon: Rollin Dover, MD;  Location: [facility] ENDOSCOPY;  Service: Gastroenterology;  Laterality: N/A;   EXTERNAL EAR SURGERY     HAND SURGERY     KNEE DEBRIDEMENT     02/2008 CHONDROPLASTY/DEB. OF MENISCUS   PILONIDAL CYST EXCISION     PROSTATE SURGERY  09/30/10   BX  2010 NEG    MEDICATIONS:  amLODipine  (NORVASC ) 5 MG tablet   apixaban  (ELIQUIS ) 5 MG TABS tablet   carvedilol  (COREG ) 6.25 MG tablet   losartan -hydrochlorothiazide  (HYZAAR) 100-25 MG tablet   rosuvastatin  (CRESTOR ) 5 MG tablet   tamsulosin  (FLOMAX ) 0.4 MG CAPS capsule   No current facility-administered medications for this encounter.    Isaiah Ruder, PA-C Surgical Short Stay/Anesthesiology [facility] Phone [phone] [facility] Phone [phone] 01/20/2024 12:10 PM

## 2024-01-23 ENCOUNTER — Encounter (HOSPITAL_COMMUNITY): Payer: Self-pay | Admitting: General Surgery

## 2024-01-23 ENCOUNTER — Encounter (HOSPITAL_COMMUNITY)
Admission: RE | Disposition: A | Discharge status: Home / Self Care | Payer: Self-pay | Source: Home / Self Care | Attending: General Surgery

## 2024-01-23 ENCOUNTER — Ambulatory Visit (HOSPITAL_COMMUNITY)
Admission: RE | Admit: 2024-01-23 | Discharge: 2024-01-23 | Disposition: A | Attending: General Surgery | Admitting: General Surgery

## 2024-01-23 ENCOUNTER — Ambulatory Visit (HOSPITAL_COMMUNITY): Admitting: Anesthesiology

## 2024-01-23 ENCOUNTER — Encounter (HOSPITAL_COMMUNITY): Payer: Self-pay | Admitting: Vascular Surgery

## 2024-01-23 SURGERY — REPAIR, HERNIA, UMBILICAL, LAPAROSCOPIC
Anesthesia: General

## 2024-01-23 MED ORDER — ACETAMINOPHEN 500 MG PO TABS: 1000.0000 mg | ORAL_TABLET | Freq: Once | ORAL | Status: DC

## 2024-01-23 MED ORDER — FENTANYL CITRATE (PF) 100 MCG/2ML IJ SOLN
INTRAMUSCULAR | Status: AC
  Filled 2024-01-23: qty 2

## 2024-01-23 MED ORDER — GABAPENTIN 100 MG PO CAPS
ORAL_CAPSULE | ORAL | Status: AC
  Administered 2024-01-23: 100 mg via ORAL
  Filled 2024-01-23: qty 1

## 2024-01-23 MED ORDER — ORAL CARE MOUTH RINSE: 15.0000 mL | Freq: Once | OROMUCOSAL | Status: AC

## 2024-01-23 MED ORDER — ACETAMINOPHEN 500 MG PO TABS
ORAL_TABLET | ORAL | Status: AC
  Administered 2024-01-23: 1000 mg via ORAL
  Filled 2024-01-23: qty 2

## 2024-01-23 MED ORDER — CEFAZOLIN SODIUM-DEXTROSE 3-4 GM/150ML-% IV SOLN: 3.0000 g | INTRAVENOUS | Status: DC

## 2024-01-23 MED ORDER — LIDOCAINE 2% (20 MG/ML) 5 ML SYRINGE
INTRAMUSCULAR | Status: AC
  Filled 2024-01-23: qty 5

## 2024-01-23 MED ORDER — ONDANSETRON HCL 4 MG/2ML IJ SOLN
INTRAMUSCULAR | Status: AC
  Filled 2024-01-23: qty 2

## 2024-01-23 MED ORDER — PROPOFOL 10 MG/ML IV BOLUS
INTRAVENOUS | Status: AC
  Filled 2024-01-23: qty 20

## 2024-01-23 MED ORDER — CHLORHEXIDINE GLUCONATE CLOTH 2 % EX PADS: 6.0000 | MEDICATED_PAD | Freq: Once | CUTANEOUS | Status: DC

## 2024-01-23 MED ORDER — GABAPENTIN 100 MG PO CAPS: 100.0000 mg | ORAL_CAPSULE | ORAL | Status: AC

## 2024-01-23 MED ORDER — LACTATED RINGERS IV SOLN: INTRAVENOUS | Status: DC

## 2024-01-23 MED ORDER — ROCURONIUM BROMIDE 10 MG/ML (PF) SYRINGE
PREFILLED_SYRINGE | INTRAVENOUS | Status: AC
  Filled 2024-01-23: qty 10

## 2024-01-23 MED ORDER — NYSTATIN-TRIAMCINOLONE 100000-0.1 UNIT/GM-% EX OINT: 1.0000 | TOPICAL_OINTMENT | Freq: Two times a day (BID) | CUTANEOUS | 0 refills | Status: AC

## 2024-01-23 MED ORDER — ACETAMINOPHEN 500 MG PO TABS: 1000.0000 mg | ORAL_TABLET | ORAL | Status: AC

## 2024-01-23 MED ORDER — BUPIVACAINE-EPINEPHRINE (PF) 0.25% -1:200000 IJ SOLN
INTRAMUSCULAR | Status: AC
  Filled 2024-01-23: qty 30

## 2024-01-23 MED ORDER — CHLORHEXIDINE GLUCONATE 0.12 % MT SOLN
15.0000 mL | Freq: Once | OROMUCOSAL | Status: AC
  Administered 2024-01-23: 15 mL via OROMUCOSAL

## 2024-01-23 NOTE — Interval H&P Note (Signed)
 History and Physical Interval Note:  01/23/2024 11:48 AM  Jacob Small  has presented today for surgery, with the diagnosis of UMBILICAL HERNIA.  The various methods of treatment have been discussed with the patient and family. After consideration of risks, benefits and other options for treatment, the patient has consented to  Procedure(s) with comments: REPAIR, HERNIA, UMBILICAL, LAPAROSCOPIC (N/A) - LAPAROSCOPIC UMBILICAL, MESH as a surgical intervention.  The patient's history has been reviewed, patient examined, no change in status, stable for surgery.  I have reviewed the patient's chart and labs.  Questions were answered to the patient's satisfaction.     [name]

## 2024-01-23 NOTE — H&P (Signed)
  The patient has a new rash at the hernia site.  Because of the risk of causing a mesh infection we will postpone the surgery until the rash is treated and resolved.

## 2024-01-23 NOTE — H&P (Signed)
 MRN: [id number] DOB: [birth year] Subjective   Chief Complaint: NEW PROBLEM   History of Present Illness: Jacob Small is a 68 y.o. male who is seen today for an umbilical hernia. The patient is a 68 year old white male who is about 6 weeks status post laparoscopic cholecystectomy who began noticing right before surgery that he had some bulging at his umbilicus. This has gotten worse since the surgery. He denies any nausea or vomiting. He denies any fevers or chills. His appetite is good and his bowels are working normally although a little more urgently since the gallbladder was removed.    Review of Systems: A complete review of systems was obtained from the patient. I have reviewed this information and discussed as appropriate with the patient. See HPI as well for other ROS.  ROS   Medical History: History reviewed. No pertinent past medical history.  Patient Active Problem List  Diagnosis  Umbilical hernia without obstruction or gangrene   Past Surgical History:  Procedure Laterality Date  CHOLECYSTECTOMY  HERNIA REPAIR    Allergies  Allergen Reactions  Rosuvastatin  Other (See Comments)  Other Reaction(s): myalgias if dosed daily  Other reaction(s): Other (See Comments)  Lisinopril Cough  Metoprolol Cough and Other (See Comments)  FATIGUE  Fatigue   Other Reaction(s): fatigue  FATIGUE   Current Outpatient Medications on File Prior to Visit  Medication Sig Dispense Refill  amLODIPine  (NORVASC ) 5 MG tablet Take 5 mg by mouth once daily  ciprofloxacin  HCl (CILOXAN ) 0.3 % ophthalmic solution Ophthalmic drop for otic use. Apply 4 drops to the affected ear 2x/day for 7 days.  losartan -hydroCHLOROthiazide  (HYZAAR) 100-25 mg tablet Take 1 tablet by mouth once daily  rosuvastatin  (CRESTOR ) 5 MG tablet Take 5 mg by mouth 3 (three) times a week  tamsulosin  (FLOMAX ) 0.4 mg capsule Take 0.4 mg by mouth once daily  apixaban  (ELIQUIS ) 5 mg tablet Take 5 mg by mouth 2 (two)  times daily   No current facility-administered medications on file prior to visit.   History reviewed. No pertinent family history.   Social History   Tobacco Use  Smoking Status Never  Smokeless Tobacco Former    Social History   Socioeconomic History  Marital status: Married  Tobacco Use  Smoking status: Never  Smokeless tobacco: Former  Advertising Account Planner status: Never Used  Substance and Sexual Activity  Alcohol use: Yes  Alcohol/week: 0.0 - 1.0 standard drinks of alcohol  Drug use: Never   Social Drivers of Health   Food Insecurity: No Food Insecurity (11/13/2023)  Received from [facility]  Hunger Vital Sign  Within the past 12 months, you worried that your food would run out before you got the money to buy more.: Never true  Within the past 12 months, the food you bought just didn't last and you didn't have money to get more.: Never true  Transportation Needs: No Transportation Needs (11/13/2023)  Received from [facility] - [facility]  In the past 12 months, has lack of transportation kept you from medical appointments or from getting medications?: No  In the past 12 months, has lack of transportation kept you from meetings, work, or from getting things needed for daily living?: No  Social Connections: Moderately Isolated (11/13/2023)  Received from [facility]  Social Connection and Isolation Panel  In a typical week, how many times do you talk on the phone with family, friends, or neighbors?: More than three times a week  How often do you  get together with friends or relatives?: More than three times a week  How often do you attend church or religious services?: Never  Do you belong to any clubs or organizations such as church groups, unions, fraternal or athletic groups, or school groups?: No  How often do you attend meetings of the clubs or organizations you belong to?: Never  Are you married, widowed, divorced, separated, never married, or living  with a partner?: Married  Housing Stability: Unknown (12/13/2023)  Housing Stability Vital Sign  Homeless in the Last Year: No   Objective:   Vitals:  PainSc: 0-No pain   There is no height or weight on file to calculate BMI.  Physical Exam Constitutional:  General: He is not in acute distress. Appearance: Normal appearance.  HENT:  Head: Normocephalic and atraumatic.  Right Ear: External ear normal.  Left Ear: External ear normal.  Nose: Nose normal.  Mouth/Throat:  Mouth: Mucous membranes are moist.  Pharynx: Oropharynx is clear.  Eyes:  General: No scleral icterus. Extraocular Movements: Extraocular movements intact.  Conjunctiva/sclera: Conjunctivae normal.  Pupils: Pupils are equal, round, and reactive to light.  Cardiovascular:  Rate and Rhythm: Normal rate and regular rhythm.  Pulses: Normal pulses.  Heart sounds: Normal heart sounds.  Pulmonary:  Effort: Pulmonary effort is normal. No respiratory distress.  Breath sounds: Normal breath sounds.  Abdominal:  General: Abdomen is flat. Bowel sounds are normal. There is no distension.  Palpations: Abdomen is soft.  Tenderness: There is no abdominal tenderness.  Comments: There is a 4 cm fascial defect with a bulging hernia at the umbilicus. This reduces easily.  Musculoskeletal:  General: No swelling or deformity. Normal range of motion.  Cervical back: Normal range of motion and neck supple. No tenderness.  Skin: General: Skin is warm and dry.  Coloration: Skin is not jaundiced.  Neurological:  General: No focal deficit present.  Mental Status: He is alert and oriented to person, place, and time.  Psychiatric:  Mood and Affect: Mood normal.  Behavior: Behavior normal.     Labs, Imaging and Diagnostic Testing:  Assessment and Plan:   Diagnoses and all orders for this visit:  Umbilical hernia without obstruction or gangrene - [facility] Case Posting Request; Future    The patient appears to have a  moderate-sized umbilical hernia. Because of the risk of incarceration and strangulation I feel he would benefit from having this fixed. He would also like to have this done. I have discussed with him in detail the risks and benefits of the operation as well as some of the technical aspects including the use of mesh and the risk of chronic pain and he understands and wishes to proceed. We will obtain cardiac clearance prior to scheduling and then move forward with the surgery. I think he would be a good candidate for a laparoscopic assisted type repair given his recent surgery and size of the hernia defect.

## 2024-02-29 ENCOUNTER — Other Ambulatory Visit: Payer: Self-pay

## 2024-02-29 DIAGNOSIS — I714 Abdominal aortic aneurysm, without rupture, unspecified: Secondary | ICD-10-CM

## 2024-03-05 ENCOUNTER — Other Ambulatory Visit: Payer: Self-pay | Admitting: Internal Medicine

## 2024-03-06 MED ORDER — APIXABAN 5 MG PO TABS: 5.0000 mg | ORAL_TABLET | Freq: Two times a day (BID) | ORAL | 5 refills | Status: AC

## 2024-03-21 ENCOUNTER — Encounter: Payer: Self-pay | Admitting: Vascular Surgery

## 2024-03-21 ENCOUNTER — Ambulatory Visit (HOSPITAL_COMMUNITY)
Admission: RE | Admit: 2024-03-21 | Discharge: 2024-03-21 | Disposition: A | Source: Ambulatory Visit | Attending: Surgery | Admitting: Surgery

## 2024-03-21 ENCOUNTER — Ambulatory Visit (INDEPENDENT_AMBULATORY_CARE_PROVIDER_SITE_OTHER): Admitting: Vascular Surgery

## 2024-03-21 VITALS — BP 120/69 | HR 62 | Temp 98.3°F | Ht 74.0 in | Wt 310.0 lb

## 2024-03-21 DIAGNOSIS — I714 Abdominal aortic aneurysm, without rupture, unspecified: Secondary | ICD-10-CM

## 2024-03-21 DIAGNOSIS — I7143 Infrarenal abdominal aortic aneurysm, without rupture: Secondary | ICD-10-CM

## 2024-03-21 NOTE — Progress Notes (Signed)
 "  Patient ID: KERBY BORNER, male   DOB: 1955/07/05, 69 y.o.   MRN: [id number]  Reason for Consult: Follow-up   Referred by Auston Opal, DO  Subjective:     HPI:  NILTON LAVE is a 69 y.o. male has a history of an ascending aortic repair in 2012.  More recently he has undergone cholecystectomy for acute gangrenous cholecystitis.  During that workup he was found to have abdominal aortic aneurysm.  He now has ventral hernia with plans for repair.  He does have a history of hypertension, hyperlipidemia and hypertension and a family history of brain aneurysm in his brother.  He is a former smoker but quit when he was very young.  He does have some numbness and tingling in his feet but no other limitations walking.  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)   Family History  Problem Relation Age of Onset   Heart disease Father    Cancer Brother        skin   Past Surgical History:  Procedure Laterality Date   AORTIC ROOT REPLACEMENT  09/22/2010   Median sternotomy for valve-sparing aortic root replacemnt Charolotte I reimplanation technique) with resection of engrafting of the  ascending thoracic aorta .Partial circulatoru arrest, coronaru aartery bypass grafting X2 (left intrnal mammary artery to distal left anterior descending coronary artery, saphenous vein graft to posterrior descending coronary artery, EVH rt thigh   CARDIOVERSION N/A 05/09/2023   Procedure: CARDIOVERSION;  Surgeon: Francyne Headland, MD;  Location: [facility];  Service: Cardiovascular;  Laterality: N/A;   CHOLECYSTECTOMY N/A 11/16/2023   Procedure: LAPAROSCOPIC CHOLECYSTECTOMY;  Surgeon: Eletha Boas, MD;  Location: [facility];  Service: General;  Laterality: N/A;   CORONARY ARTERY BYPASS GRAFT  09/22/2010   CABGx2 with LIMA to LAD, SVG to PDA   CORONARY ARTERY BYPASS GRAFT     CYSTOSCOPY WITH INSERTION OF UROLIFT N/A 03/01/2019   Procedure: CYSTOSCOPY WITH INSERTION OF UROLIFT;  Surgeon: Kassie Ozell SAUNDERS, MD;  Location: [facility] OR[facility];  Service: Urology;  Laterality: N/A;   ERCP N/A 11/15/2023   Procedure: ERCP, WITH INTERVENTION IF INDICATED;  Surgeon: Rollin Dover, MD;  Location: [facility];  Service: Gastroenterology;  Laterality: N/A;   EXTERNAL EAR SURGERY     HAND SURGERY     KNEE DEBRIDEMENT     02/2008 CHONDROPLASTY/DEB. OF MENISCUS   PILONIDAL CYST EXCISION     PROSTATE SURGERY  09/30/10   BX  2010 NEG    Short Social History:  Social History  Tobacco Use   Smoking status: Former    Current packs/day: 0.00    Types: Cigarettes    Quit date: 02/16/1995    Years since quitting: 29.1   Smokeless tobacco: Never  Substance Use Topics   Alcohol use: Yes    Comment: Occas    Allergies[1]  Current Outpatient Medications  Medication Sig Dispense Refill   acetaminophen  (TYLENOL ) 325 MG tablet Take 650 mg by mouth every 6 (six) hours as needed for moderate pain (pain score 4-6).     amLODipine  (NORVASC ) 5 MG tablet TAKE 1 TABLET BY MOUTH DAILY. 90 tablet 2   apixaban  (ELIQUIS ) 5 MG TABS tablet Take 1 tablet (5 mg total) by mouth 2 (two) times daily. 60 tablet 5   carvedilol  (COREG )  6.25 MG tablet TAKE 1 TABLET BY MOUTH 2 TIMES DAILY WITH A MEAL. 180 tablet 2   losartan -hydrochlorothiazide  (HYZAAR) 100-25 MG tablet TAKE 1 TABLET BY MOUTH EVERY DAY 90 tablet 2   nystatin -triamcinolone  ointment (MYCOLOG) Apply 1 Application topically 2 (two) times daily. (Patient not taking: Reported on 03/20/2024) 30 g 0   rosuvastatin  (CRESTOR ) 5 MG tablet TAKE 1 TABLET BY MOUTH THREE TIMES A WEEK (Patient taking differently: Take 5 mg by mouth every Monday, Wednesday, and Friday.) 36 tablet 3   tamsulosin  (FLOMAX ) 0.4 MG CAPS capsule Take 0.4 mg by mouth daily.     No current facility-administered medications for this visit.    Review of Systems  Constitutional:  Constitutional negative. HENT: HENT negative.  Eyes: Eyes negative.  Respiratory: Respiratory negative.  Cardiovascular: Cardiovascular negative.  GI: Gastrointestinal negative.  Skin: Skin negative.  Neurological: Positive for numbness.  Hematologic: Hematologic/lymphatic negative.  Psychiatric: Psychiatric negative.        Objective:  Objective   Vitals:   03/21/24 0953  BP: 120/69  Pulse: 62  Temp: 98.3 F (36.8 C)  SpO2: 96%  Weight: (!) 310 lb (140.6 kg)  Height: 6' 2 (1.88 m)   Body mass index is 39.8 kg/m.  Physical Exam Constitutional:      Appearance: He is obese.  HENT:     Head: Normocephalic.     Mouth/Throat:     Mouth: Mucous membranes are moist.  Eyes:     Pupils: Pupils are equal, round, and reactive to light.  Cardiovascular:     Pulses:          Popliteal pulses are 3+ on the right side and 3+ on the left side.  Abdominal:     General: Abdomen is flat.     Hernia: A hernia is present.  Musculoskeletal:        General: Normal range of motion.     Right lower leg: No edema.     Left lower leg: No edema.  Skin:    General: Skin is warm.  Neurological:     General: No focal deficit present.     Mental Status: He is alert.     Data: Abdominal Aorta Findings:   +-----------+-------+----------+----------+---------+--------+-------------  ----+  Location  AP (cm)Trans (cm)PSV (cm/s)Waveform ThrombusComments            +-----------+-------+----------+----------+---------+--------+-------------  ----+  Proximal                                              NWV                 +-----------+-------+----------+----------+---------+--------+-------------  ----+  Mid       5.20   5.20      0                          unable to                                                                  determine                                                                  fusiform vs                                                                saccular            +-----------+-------+----------+----------+---------+--------+-------------  ----+  Distal    3.70   3.70      70                         fusiform            +-----------+-------+----------+----------+---------+--------+-------------  ----+  RT CIA Prox2.0    2.0       96        triphasic        fusiform            +-----------+-------+----------+----------+---------+--------+-------------  ----+  RT EIA Prox1.5    1.5       90        triphasic                            +-----------+-------+----------+----------+---------+--------+-------------  ----+  LT CIA Prox2.0    2.0       76        triphasic        fusiform            +-----------+-------+----------+----------+---------+--------+-------------  ----+  LT EIA Prox1.5    1.5       90        triphasic                            +-----------+-------+----------+----------+---------+--------+-------------  ----+   Visualization of the [place] artery and Proximal Abdominal Aorta was  limited.   IVC/Iliac Findings:  +--------+------+--------+--------+   IVC   PatentThrombusComments  +--------+------+--------+--------+  IVC Proxpatent                   +--------+------+--------+--------+          Summary:  Abdominal Aorta: There is evidence of abnormal dilatation of the mid and  distal Abdominal aorta. There is evidence of abnormal dilation of the  Right Common Iliac artery and Left Common Iliac artery.  The largest aortic  measurement is 5.2 cm. No previous  exam available for comparison.      Assessment/Plan:    69 year old male with a 5.2 cm abdominal aortic aneurysm.  We discussed the signs and symptoms of rupture and need for follow-up in 6 months with repeat duplex and plan for CT angio when it reaches 5.5 cm.  He also has prominent popliteal pulses and will obtain bilateral lower extremity duplexes at that time.  From a vascular surgery standpoint he is okay for ventral hernia repair.     Penne Lonni Colorado MD [facility]      [1]  Allergies Allergen Reactions   Lisinopril Cough   Metoprolol Cough     Fatigue    "

## 2024-03-23 NOTE — Pre-Procedure Instructions (Signed)
 Surgical Instructions     Your procedure is scheduled on March 28, 2024. Report to [facility] Main Entrance A at 6:30 A.M., then check in with the Admitting office. Any questions or running late day of surgery: call [phone]   Questions prior to your surgery date: call [phone], Monday-Friday, 8am-4pm. If you experience any cold or flu symptoms such as cough, fever, chills, shortness of breath, etc. between now and your scheduled surgery, please notify us  at the above number.            Remember:       Do not eat after midnight the night before your surgery     You may drink clear liquids until 5:30am the morning of your surgery.   Clear liquids allowed are: Water, Non-Citrus Juices (without pulp), Carbonated Beverages, Clear Tea (no milk, honey, etc.), Black Coffee Only (NO MILK, CREAM OR POWDERED CREAMER of any kind), and Gatorade.          Take these medicines the morning of surgery with A SIP OF WATER  AmLODipine  (NORVASC )  Carvedilol  (COREG )  Tamsulosin  (FLOMAX )  Rosuvastatin  (CRESTOR )  IF NEEDED: Acetaminophen  (Tylenol )   Per your cardiologist's instruction, Hold your apixaban  (ELIQUIS ) for 2 day's prior to surgery. Last dose Sunday, February 8th.    One week prior to surgery, STOP taking any Aspirin  (unless otherwise instructed by your surgeon) Aleve, Naproxen, Ibuprofen, Motrin, Advil, Goody's, BC's, all herbal medications, fish oil, and non-prescription vitamins.                     Do NOT Smoke (Tobacco/Vaping) for 24 hours prior to your procedure.   If you use a CPAP at night, you may bring your mask/headgear for your overnight stay.   You will be asked to remove any contacts, glasses, piercing's, hearing aid's, dentures/partials prior to surgery. Please bring cases for these items if needed.    Patients discharged the day of surgery will not be allowed to drive home, and someone needs to stay with them for 24 hours.   SURGICAL WAITING ROOM  VISITATION Patients may have no more than 2 support people in the waiting area - these visitors may rotate.   Pre-op nurse will coordinate an appropriate time for 1 ADULT support person, who may not rotate, to accompany patient in pre-op.  Children under the age of 34 must have an adult with them who is not the patient and must remain in the main waiting area with an adult.   If the patient needs to stay at the hospital during part of their recovery, the visitor guidelines for inpatient rooms apply.   Please refer to the [facility] website for the visitor guidelines for any additional information.     If you received a COVID test during your pre-op visit  it is requested that you wear a mask when out in public, stay away from anyone that may not be feeling well and notify your surgeon if you develop symptoms. If you have been in contact with anyone that has tested positive in the last 10 days please notify you surgeon.         Pre-operative CHG Bathing Instructions    You can play a key role in reducing the risk of infection after surgery. Your skin needs to be as free of germs as possible. You can reduce the number of germs on your skin by washing with CHG (chlorhexidine  gluconate) soap before surgery. CHG is an antiseptic soap  that kills germs and continues to kill germs even after washing.    DO NOT use if you have an allergy to chlorhexidine /CHG or antibacterial soaps. If your skin becomes reddened or irritated, stop using the CHG and notify one of our RNs at [phone].               TAKE A SHOWER THE NIGHT BEFORE SURGERY    Please keep in mind the following:  You may shave your face before/day of surgery.  Place clean sheets on your bed the night before surgery Use a clean washcloth (not used since being washed) for shower. DO NOT sleep with pet's night before surgery.   CHG Shower Instructions:  Wash your face and private area with normal soap. If you choose to wash your hair,  wash first with your normal shampoo.  After you use shampoo/soap, rinse your hair and body thoroughly to remove shampoo/soap residue.  Turn the water OFF and apply half the bottle of CHG soap to a CLEAN washcloth.  Apply CHG soap ONLY FROM YOUR NECK DOWN TO YOUR TOES (washing for 3-5 minutes)  DO NOT use CHG soap on face, private areas, open wounds, or sores.  Pay special attention to the area where your surgery is being performed.  If you are having back surgery, having someone wash your back for you may be helpful. Wait 2 minutes after CHG soap is applied, then you may rinse off the CHG soap.  Pat dry with a clean towel  Put on clean pajamas     Additional instructions for the day of surgery: If you choose, you may shower the morning of surgery with an antibacterial soap.  DO NOT APPLY any lotions, deodorants, or cologne.   Do not wear jewelry Do not bring valuables to the hospital. [facility] is not responsible for valuables/personal belongings. Put on clean/comfortable clothes.  Please brush your teeth.  Ask your nurse before applying any prescription medications to the skin.

## 2024-03-26 ENCOUNTER — Inpatient Hospital Stay (HOSPITAL_COMMUNITY): Admission: RE | Admit: 2024-03-26

## 2024-03-28 ENCOUNTER — Encounter (HOSPITAL_COMMUNITY): Admission: RE | Payer: Self-pay | Source: Home / Self Care

## 2024-03-28 ENCOUNTER — Ambulatory Visit (HOSPITAL_COMMUNITY): Admission: RE | Admit: 2024-03-28 | Source: Home / Self Care | Admitting: General Surgery

## 2024-03-28 SURGERY — REPAIR, HERNIA, UMBILICAL, LAPAROSCOPIC
Anesthesia: General
